# Patient Record
Sex: Male | Born: 1970 | Race: Black or African American | Marital: Married | Smoking: Current every day smoker
Health system: Southern US, Community
[De-identification: ages and names within clinical notes are randomized; demographics above are authoritative.]

## PROBLEM LIST (undated history)

## (undated) DIAGNOSIS — I10 Essential (primary) hypertension: Secondary | ICD-10-CM

## (undated) DIAGNOSIS — F32A Depression, unspecified: Secondary | ICD-10-CM

## (undated) DIAGNOSIS — I739 Peripheral vascular disease, unspecified: Secondary | ICD-10-CM

## (undated) HISTORY — DX: Peripheral vascular disease, unspecified: I73.9

## (undated) HISTORY — PX: OTHER SURGICAL HISTORY: SHX169

---

## 2014-05-06 DIAGNOSIS — M1990 Unspecified osteoarthritis, unspecified site: Secondary | ICD-10-CM

## 2014-05-06 DIAGNOSIS — G5600 Carpal tunnel syndrome, unspecified upper limb: Secondary | ICD-10-CM

## 2014-05-06 DIAGNOSIS — M19041 Primary osteoarthritis, right hand: Secondary | ICD-10-CM

## 2014-05-06 DIAGNOSIS — S63299A Dislocation of distal interphalangeal joint of unspecified finger, initial encounter: Secondary | ICD-10-CM | POA: Insufficient documentation

## 2014-05-06 HISTORY — DX: Carpal tunnel syndrome, unspecified upper limb: G56.00

## 2014-05-06 HISTORY — DX: Dislocation of distal interphalangeal joint of unspecified finger, initial encounter: S63.299A

## 2014-05-06 HISTORY — DX: Unspecified osteoarthritis, unspecified site: M19.90

## 2014-05-06 HISTORY — DX: Primary osteoarthritis, right hand: M19.041

## 2015-11-27 DIAGNOSIS — M21941 Unspecified acquired deformity of hand, right hand: Secondary | ICD-10-CM | POA: Insufficient documentation

## 2015-11-27 DIAGNOSIS — E559 Vitamin D deficiency, unspecified: Secondary | ICD-10-CM | POA: Insufficient documentation

## 2015-11-27 DIAGNOSIS — I10 Essential (primary) hypertension: Secondary | ICD-10-CM

## 2015-11-27 DIAGNOSIS — L409 Psoriasis, unspecified: Secondary | ICD-10-CM

## 2015-11-27 HISTORY — DX: Essential (primary) hypertension: I10

## 2015-11-27 HISTORY — DX: Unspecified acquired deformity of hand, right hand: M21.941

## 2015-11-27 HISTORY — DX: Psoriasis, unspecified: L40.9

## 2017-12-24 ENCOUNTER — Other Ambulatory Visit: Payer: Self-pay | Admitting: Orthopedic Surgery

## 2017-12-24 DIAGNOSIS — M25552 Pain in left hip: Secondary | ICD-10-CM

## 2018-01-06 ENCOUNTER — Ambulatory Visit
Admission: RE | Admit: 2018-01-06 | Discharge: 2018-01-06 | Disposition: A | Discharge status: Home / Self Care | Payer: BLUE CROSS/BLUE SHIELD | Source: Ambulatory Visit | Attending: Orthopedic Surgery | Admitting: Orthopedic Surgery

## 2018-01-06 DIAGNOSIS — M25552 Pain in left hip: Secondary | ICD-10-CM

## 2018-01-06 MED ORDER — IOPAMIDOL (ISOVUE-M 200) INJECTION 41%
20.0000 mL | Freq: Once | INTRAMUSCULAR | Status: AC
  Administered 2018-01-06: 20 mL via INTRA_ARTICULAR

## 2018-01-06 MED ORDER — METHYLPREDNISOLONE ACETATE 40 MG/ML INJ SUSP (RADIOLOG
120.0000 mg | Freq: Once | INTRAMUSCULAR | Status: AC
  Administered 2018-01-06: 120 mg via INTRA_ARTICULAR

## 2018-01-07 ENCOUNTER — Other Ambulatory Visit: Payer: Self-pay | Admitting: Orthopedic Surgery

## 2018-01-07 DIAGNOSIS — M25552 Pain in left hip: Secondary | ICD-10-CM

## 2018-01-19 ENCOUNTER — Ambulatory Visit
Admission: RE | Admit: 2018-01-19 | Discharge: 2018-01-19 | Disposition: A | Discharge status: Home / Self Care | Payer: BLUE CROSS/BLUE SHIELD | Source: Ambulatory Visit | Attending: Orthopedic Surgery | Admitting: Orthopedic Surgery

## 2018-01-19 DIAGNOSIS — M25552 Pain in left hip: Secondary | ICD-10-CM

## 2018-01-19 MED ORDER — IOPAMIDOL (ISOVUE-M 200) INJECTION 41%
15.0000 mL | Freq: Once | INTRAMUSCULAR | Status: AC
  Administered 2018-01-19: 15 mL via INTRA_ARTICULAR

## 2018-09-20 ENCOUNTER — Inpatient Hospital Stay (HOSPITAL_COMMUNITY)
Admission: EM | Admit: 2018-09-20 | Discharge: 2018-09-24 | DRG: 272 | Disposition: A | Discharge status: Home Health | Payer: BLUE CROSS/BLUE SHIELD | Attending: Vascular Surgery | Admitting: Vascular Surgery

## 2018-09-20 ENCOUNTER — Inpatient Hospital Stay (HOSPITAL_COMMUNITY): Payer: BLUE CROSS/BLUE SHIELD

## 2018-09-20 ENCOUNTER — Other Ambulatory Visit: Payer: Self-pay

## 2018-09-20 ENCOUNTER — Emergency Department (HOSPITAL_BASED_OUTPATIENT_CLINIC_OR_DEPARTMENT_OTHER): Payer: BLUE CROSS/BLUE SHIELD

## 2018-09-20 ENCOUNTER — Encounter (HOSPITAL_COMMUNITY): Payer: Self-pay

## 2018-09-20 DIAGNOSIS — I739 Peripheral vascular disease, unspecified: Secondary | ICD-10-CM | POA: Diagnosis present

## 2018-09-20 DIAGNOSIS — I10 Essential (primary) hypertension: Secondary | ICD-10-CM | POA: Diagnosis present

## 2018-09-20 DIAGNOSIS — M7989 Other specified soft tissue disorders: Secondary | ICD-10-CM | POA: Diagnosis not present

## 2018-09-20 DIAGNOSIS — F17210 Nicotine dependence, cigarettes, uncomplicated: Secondary | ICD-10-CM | POA: Diagnosis present

## 2018-09-20 DIAGNOSIS — I70212 Atherosclerosis of native arteries of extremities with intermittent claudication, left leg: Principal | ICD-10-CM | POA: Diagnosis present

## 2018-09-20 DIAGNOSIS — M79609 Pain in unspecified limb: Secondary | ICD-10-CM

## 2018-09-20 DIAGNOSIS — I998 Other disorder of circulatory system: Secondary | ICD-10-CM | POA: Diagnosis not present

## 2018-09-20 HISTORY — DX: Essential (primary) hypertension: I10

## 2018-09-20 LAB — BASIC METABOLIC PANEL
Anion gap: 10 (ref 5–15)
BUN: 7 mg/dL (ref 6–20)
CO2: 26 mmol/L (ref 22–32)
Calcium: 9.2 mg/dL (ref 8.9–10.3)
Chloride: 104 mmol/L (ref 98–111)
Creatinine, Ser: 1.07 mg/dL (ref 0.61–1.24)
GFR calc Af Amer: >60 mL/min (ref >60)
GFR calc non Af Amer: >60 mL/min (ref >60)
Glucose, Bld: 104 mg/dL — ABNORMAL HIGH (ref 70–99)
Potassium: 3.9 mmol/L (ref 3.5–5.1)
Sodium: 140 mmol/L (ref 135–145)

## 2018-09-20 LAB — CBC WITH DIFFERENTIAL/PLATELET
Abs Immature Granulocytes: 0.06 10*3/uL (ref 0.00–0.07)
Basophils Absolute: 0.1 10*3/uL (ref 0.0–0.1)
Basophils Relative: 0 %
Eosinophils Absolute: 0.1 10*3/uL (ref 0.0–0.5)
Eosinophils Relative: 1 %
HCT: 48.8 % (ref 39.0–52.0)
Hemoglobin: 16.1 g/dL (ref 13.0–17.0)
Immature Granulocytes: 1 %
Lymphocytes Relative: 24 %
Lymphs Abs: 3.1 10*3/uL (ref 0.7–4.0)
MCH: 26.4 pg (ref 26.0–34.0)
MCHC: 33 g/dL (ref 30.0–36.0)
MCV: 80.1 fL (ref 80.0–100.0)
Monocytes Absolute: 0.8 10*3/uL (ref 0.1–1.0)
Monocytes Relative: 6 %
Neutro Abs: 9.1 10*3/uL — ABNORMAL HIGH (ref 1.7–7.7)
Neutrophils Relative %: 68 %
Platelets: 262 10*3/uL (ref 150–400)
RBC: 6.09 MIL/uL — ABNORMAL HIGH (ref 4.22–5.81)
RDW: 15 % (ref 11.5–15.5)
WBC: 13.3 10*3/uL — ABNORMAL HIGH (ref 4.0–10.5)
nRBC: 0 % (ref 0.0–0.2)

## 2018-09-20 MED ORDER — HEPARIN BOLUS VIA INFUSION
4000.0000 [IU] | Freq: Once | INTRAVENOUS | Status: AC
  Administered 2018-09-20: 4000 [IU] via INTRAVENOUS
  Filled 2018-09-20: qty 4000

## 2018-09-20 MED ORDER — HYDRALAZINE HCL 20 MG/ML IJ SOLN: 5.0000 mg | INTRAMUSCULAR | Status: DC | PRN

## 2018-09-20 MED ORDER — PANTOPRAZOLE SODIUM 40 MG PO TBEC: 40.0000 mg | DELAYED_RELEASE_TABLET | Freq: Every day | ORAL | Status: DC

## 2018-09-20 MED ORDER — LABETALOL HCL 5 MG/ML IV SOLN
10.0000 mg | INTRAVENOUS | Status: DC | PRN
  Administered 2018-09-20 (×2): 10 mg via INTRAVENOUS
  Filled 2018-09-20 (×2): qty 4

## 2018-09-20 MED ORDER — METOPROLOL TARTRATE 5 MG/5ML IV SOLN: 2.0000 mg | INTRAVENOUS | Status: DC | PRN

## 2018-09-20 MED ORDER — KCL IN DEXTROSE-NACL 20-5-0.45 MEQ/L-%-% IV SOLN
INTRAVENOUS | Status: DC
  Administered 2018-09-20: 22:00:00 via INTRAVENOUS
  Filled 2018-09-20: qty 1000

## 2018-09-20 MED ORDER — GUAIFENESIN-DM 100-10 MG/5ML PO SYRP: 15.0000 mL | ORAL_SOLUTION | ORAL | Status: DC | PRN

## 2018-09-20 MED ORDER — HEPARIN (PORCINE) 25000 UT/250ML-% IV SOLN
1800.0000 [IU]/h | INTRAVENOUS | Status: DC
  Administered 2018-09-20: 1600 [IU]/h via INTRAVENOUS
  Filled 2018-09-20: qty 250

## 2018-09-20 MED ORDER — ACETAMINOPHEN 325 MG PO TABS: 325.0000 mg | ORAL_TABLET | ORAL | Status: DC | PRN

## 2018-09-20 MED ORDER — ALUM & MAG HYDROXIDE-SIMETH 200-200-20 MG/5ML PO SUSP: 15.0000 mL | ORAL | Status: DC | PRN

## 2018-09-20 MED ORDER — PHENOL 1.4 % MT LIQD
1.0000 | OROMUCOSAL | Status: DC | PRN
  Filled 2018-09-20: qty 177

## 2018-09-20 MED ORDER — IOPAMIDOL (ISOVUE-370) INJECTION 76%
100.0000 mL | Freq: Once | INTRAVENOUS | Status: AC | PRN
  Administered 2018-09-20: 100 mL via INTRAVENOUS

## 2018-09-20 MED ORDER — LISINOPRIL 2.5 MG PO TABS
2.5000 mg | ORAL_TABLET | Freq: Once | ORAL | Status: AC
  Administered 2018-09-20: 2.5 mg via ORAL
  Filled 2018-09-20: qty 1

## 2018-09-20 MED ORDER — ONDANSETRON HCL 4 MG/2ML IJ SOLN: 4.0000 mg | Freq: Four times a day (QID) | INTRAMUSCULAR | Status: DC | PRN

## 2018-09-20 MED ORDER — MORPHINE SULFATE (PF) 2 MG/ML IV SOLN
2.0000 mg | INTRAVENOUS | Status: DC | PRN
  Administered 2018-09-20 (×2): 4 mg via INTRAVENOUS
  Administered 2018-09-21: 5 mg via INTRAVENOUS
  Filled 2018-09-20: qty 2
  Filled 2018-09-20: qty 3
  Filled 2018-09-20 (×2): qty 2

## 2018-09-20 MED ORDER — ACETAMINOPHEN 325 MG RE SUPP: 325.0000 mg | RECTAL | Status: DC | PRN

## 2018-09-20 MED ORDER — IOPAMIDOL (ISOVUE-370) INJECTION 76%
INTRAVENOUS | Status: AC
  Filled 2018-09-20: qty 100

## 2018-09-20 MED ORDER — MORPHINE SULFATE (PF) 4 MG/ML IV SOLN
6.0000 mg | Freq: Once | INTRAVENOUS | Status: AC
  Administered 2018-09-20: 6 mg via INTRAVENOUS
  Filled 2018-09-20: qty 2

## 2018-09-20 MED ORDER — POTASSIUM CHLORIDE CRYS ER 20 MEQ PO TBCR: 20.0000 meq | EXTENDED_RELEASE_TABLET | Freq: Once | ORAL | Status: DC

## 2018-09-20 NOTE — ED Notes (Signed)
Patient transported to CT 

## 2018-09-20 NOTE — Progress Notes (Signed)
ANTICOAGULATION CONSULT NOTE - Initial Consult  Pharmacy Consult for heparin Indication: ischemic limb  Patient Measurements: Height: 6' 5.5" (196.9 cm) Weight: (!) 310 lb (140.6 kg) IBW/kg (Calculated) : 90.25 Heparin Dosing Weight: 121 kg  Vital Signs: Temp: 97.4 F (36.3 C) (01/28 1159) Temp Source: Oral (01/28 1159) BP: 180/112 (01/28 1906) Pulse Rate: 92 (01/28 1906)  Labs: Recent Labs    09/20/18 1903  HGB 16.1  HCT 48.8  PLT 262    Assessment:  48 yo male with L calf pain. Starting heparin infusion for ischemic limb. H/h, plts wnl, SCr 1. He is not on any anticoagulation prior to admission.   Goal of Therapy:  Heparin level 0.3-0.7 units/ml Monitor platelets by anticoagulation protocol: Yes    Plan:  -Heparin bolus 4000 units x1 then 1600 units/hr -Daily HL, CBC -First level with AM labs   Jamielyn Petrucci, Darl Householder 09/20/2018,7:39 PM

## 2018-09-20 NOTE — ED Provider Notes (Signed)
[facility] EMERGENCY DEPARTMENT Provider Note   CSN: [id number] Arrival date & time: 09/20/18  1129     History   Chief Complaint Chief Complaint  Patient presents with  . Leg Pain    HPI Normand Damron is a 48 y.o. male.  Complains of left calf pain onset gradually 3 days ago.  Pain is worse with weightbearing and improved with nonweightbearing no treatment prior to coming here.  No other associated symptoms..  He denies injury denies fever.  HPI  Past Medical History:  Diagnosis Date  . Hypertension     There are no active problems to display for this patient.   History reviewed. No pertinent surgical history.      Home Medications    Prior to Admission medications   Not on File    Family History History reviewed. No pertinent family history.  Social History Social History   Tobacco Use  . Smoking status: Current Every Day Smoker    Packs/day: 1.00    Types: Cigarettes  Substance Use Topics  . Alcohol use: Never    Frequency: Never  . Drug use: Never     Allergies   Patient has no known allergies.   Review of Systems Review of Systems  Constitutional: Negative.   HENT: Negative.   Respiratory: Negative.   Cardiovascular: Negative.   Gastrointestinal: Negative.   Musculoskeletal: Positive for myalgias.       Left calf pain.  Patient gives symptoms of claudication, prior to the past 3 days where he developed calf pain when walking which improved with rest  Skin: Negative.   Neurological: Negative.   Psychiatric/Behavioral: Negative.   All other systems reviewed and are negative.    Physical Exam Updated Vital Signs BP (!) 190/123 (BP Location: Right Arm)   Pulse (!) 102   Temp (!) 97.4 F (36.3 C) (Oral)   Resp 18   SpO2 98%   Physical Exam Vitals signs and nursing note reviewed.  Constitutional:      Appearance: Normal appearance. He is well-developed.  HENT:     Head: Normocephalic and atraumatic.  Eyes:       Conjunctiva/sclera: Conjunctivae normal.     Pupils: Pupils are equal, round, and reactive to light.  Neck:     Musculoskeletal: Neck supple.     Thyroid: No thyromegaly.     Trachea: No tracheal deviation.  Cardiovascular:     Rate and Rhythm: Normal rate and regular rhythm.     Heart sounds: No murmur.  Pulmonary:     Effort: Pulmonary effort is normal.     Breath sounds: Normal breath sounds.  Abdominal:     General: Bowel sounds are normal. There is no distension.     Palpations: Abdomen is soft.     Tenderness: There is no abdominal tenderness.  Musculoskeletal: Normal range of motion.        General: No tenderness.     Comments: Left lower extremity cool to the touch, hairless below the knee.  DP, PT and popliteal pulse absent.  Femoral pulse 2+.  Moves toes well  Right lower extremity DP, PT pulses absent.  Popliteal pulse present by Doppler.  Femoral pulse 2+.  Moves toes well  Bilateral upper extremities warm, radial pulses 2+ bilaterally    Skin:    General: Skin is warm and dry.     Findings: No rash.  Neurological:     Mental Status: He is alert.  Coordination: Coordination normal.      ED Treatments / Results  Labs (all labs ordered are listed, but only abnormal results are displayed) Labs Reviewed - No data to display  EKG None  Radiology Vas [place] Lower Extremity Venous (dvt) (only [facility])  Result Date: 09/20/2018  Lower Venous Study Indications: Swelling, and Pain.  Performing Technologist: Blanch MediaMegan Riddle RVS  Examination Guidelines: A complete evaluation includes B-mode imaging, spectral Doppler, color Doppler, and power Doppler as needed of all accessible portions of each vessel. Bilateral testing is considered an integral part of a complete examination. Limited examinations for reoccurring indications may be performed as noted.  Right Venous Findings: +---+---------------+---------+-----------+----------+-------+     CompressibilityPhasicitySpontaneityPropertiesSummary +---+---------------+---------+-----------+----------+-------+ CFVFull           Yes      Yes                          +---+---------------+---------+-----------+----------+-------+  Left Venous Findings: +---------+---------------+---------+-----------+----------+--------------+          CompressibilityPhasicitySpontaneityPropertiesSummary        +---------+---------------+---------+-----------+----------+--------------+ CFV      Full           Yes      Yes                                 +---------+---------------+---------+-----------+----------+--------------+ SFJ      Full                                                        +---------+---------------+---------+-----------+----------+--------------+ FV Prox  Full                                                        +---------+---------------+---------+-----------+----------+--------------+ FV Mid   Full                                                        +---------+---------------+---------+-----------+----------+--------------+ FV DistalFull                                                        +---------+---------------+---------+-----------+----------+--------------+ PFV      Full                                                        +---------+---------------+---------+-----------+----------+--------------+ POP      Full           Yes      Yes                                 +---------+---------------+---------+-----------+----------+--------------+  PTV      Full                                                        +---------+---------------+---------+-----------+----------+--------------+ PERO                                                  Not visualized +---------+---------------+---------+-----------+----------+--------------+    Summary: Right: No evidence of common femoral vein obstruction. Left: There is  no evidence of deep vein thrombosis in the lower extremity. No cystic structure found in the popliteal fossa.  *See table(s) above for measurements and observations. Electronically signed by Waverly Ferrari MD on 09/20/2018 at 5:09:22 PM.    Final     Procedures Procedures (including critical care time)  Medications Ordered in ED Medications - No data to display Results for orders placed or performed during the hospital encounter of 09/20/18  CBC with Differential/Platelet  Result Value Ref Range   WBC 13.3 (H) 4.0 - 10.5 K/uL   RBC 6.09 (H) 4.22 - 5.81 MIL/uL   Hemoglobin 16.1 13.0 - 17.0 g/dL   HCT 16.1 09.6 - 04.5 %   MCV 80.1 80.0 - 100.0 fL   MCH 26.4 26.0 - 34.0 pg   MCHC 33.0 30.0 - 36.0 g/dL   RDW 40.9 81.1 - 91.4 %   Platelets 262 150 - 400 K/uL   nRBC 0.0 0.0 - 0.2 %   Neutrophils Relative % 68 %   Neutro Abs 9.1 (H) 1.7 - 7.7 K/uL   Lymphocytes Relative 24 %   Lymphs Abs 3.1 0.7 - 4.0 K/uL   Monocytes Relative 6 %   Monocytes Absolute 0.8 0.1 - 1.0 K/uL   Eosinophils Relative 1 %   Eosinophils Absolute 0.1 0.0 - 0.5 K/uL   Basophils Relative 0 %   Basophils Absolute 0.1 0.0 - 0.1 K/uL   Immature Granulocytes 1 %   Abs Immature Granulocytes 0.06 0.00 - 0.07 K/uL   Vas [place] Lower Extremity Venous (dvt) (only [facility])  Result Date: 09/20/2018  Lower Venous Study Indications: Swelling, and Pain.  Performing Technologist: Blanch Media RVS  Examination Guidelines: A complete evaluation includes B-mode imaging, spectral Doppler, color Doppler, and power Doppler as needed of all accessible portions of each vessel. Bilateral testing is considered an integral part of a complete examination. Limited examinations for reoccurring indications may be performed as noted.  Right Venous Findings: +---+---------------+---------+-----------+----------+-------+    CompressibilityPhasicitySpontaneityPropertiesSummary +---+---------------+---------+-----------+----------+-------+  CFVFull           Yes      Yes                          +---+---------------+---------+-----------+----------+-------+  Left Venous Findings: +---------+---------------+---------+-----------+----------+--------------+          CompressibilityPhasicitySpontaneityPropertiesSummary        +---------+---------------+---------+-----------+----------+--------------+ CFV      Full           Yes      Yes                                 +---------+---------------+---------+-----------+----------+--------------+  SFJ      Full                                                        +---------+---------------+---------+-----------+----------+--------------+ FV Prox  Full                                                        +---------+---------------+---------+-----------+----------+--------------+ FV Mid   Full                                                        +---------+---------------+---------+-----------+----------+--------------+ FV DistalFull                                                        +---------+---------------+---------+-----------+----------+--------------+ PFV      Full                                                        +---------+---------------+---------+-----------+----------+--------------+ POP      Full           Yes      Yes                                 +---------+---------------+---------+-----------+----------+--------------+ PTV      Full                                                        +---------+---------------+---------+-----------+----------+--------------+ PERO                                                  Not visualized +---------+---------------+---------+-----------+----------+--------------+    Summary: Right: No evidence of common femoral vein obstruction. Left: There is no evidence of deep vein thrombosis in the lower extremity. No cystic structure found in the popliteal fossa.  *See  table(s) above for measurements and observations. Electronically signed by Waverly Ferrari MD on 09/20/2018 at 5:09:22 PM.    Final     Initial Impression / Assessment and Plan / ED Course  I have reviewed the triage vital signs and the nursing notes.  Pertinent labs & imaging results that were available during my care of the patient were reviewed by me and considered in my medical decision making (see chart for details).     Vascular surgeon Dr. Edilia Bo consulted who  will see patient in the ED.  Dr. Edilia Bo.  Patient in the emergency department and arrange for admission and will arrange for further vascular studies tomorrow 7:25 PM pain improved after treatment with intravenous morphine.  Lab work remarkable for leukocytosis.  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 CRITICAL CARE Performed by: Doug Sou Total critical care time: 30 minutes Critical care time was exclusive of separately billable procedures and treating other patients. Critical care was necessary to treat or prevent imminent or life-threatening deterioration. Critical care was time spent personally by me on the following activities: development of treatment plan with patient and/or surrogate as well as nursing, discussions with consultants, evaluation of patient's response to treatment, examination of patient, obtaining history from patient or surrogate, ordering and performing treatments and interventions, ordering and review of laboratory studies, ordering and review of radiographic studies, pulse oximetry and re-evaluation of patient's condition.  Final diagnoses:  None    ED Discharge Orders    None       Doug Sou, MD 09/20/18 [phone]

## 2018-09-20 NOTE — ED Triage Notes (Signed)
Pt endorses left lower leg pain from the knee down x 3 days. This RN had to doppler pulses behind ankle. Skin is warm and dry. Hypertensive, hx of same and has been off of BP meds x 5 months, recently got insurance again.

## 2018-09-20 NOTE — H&P (Signed)
REASON FOR CONSULT:    Left calf pain.  The consult was requested by Dr. Ethelda Chick.  HPI:   Sean [name] is a pleasant 48 y.o. male, who noted the gradual onset of left calf pain 3 days ago.  He tells me that the pain is limited to the calf and does not involve his foot.  Pain became more severe today and therefore he presented to the emergency department this morning.  Prior to this he did describe a history of left calf claudication which began gradually in October and has gradually progressed.  He denies any history of rest pain or nonhealing ulcers.  He denies any history of DVT.  He has had some hip issues and has injections in his hips occasionally.  His risk factors for peripheral vascular disease include hypertension, hypercholesterolemia, and tobacco use.  He denies any history of diabetes or family history of premature cardiovascular disease.  He smokes a pack per day of cigarettes and has been smoking since he was 48 years old.  He denies any cardiac history.  Specifically he denies any history of myocardial infarction, history of arrhythmias, or history of congestive heart failure.  Past Medical History:  Diagnosis Date  . Hypertension     History reviewed. No pertinent family history.  There is no family history of premature cardiovascular disease.  SOCIAL HISTORY: Social History   Socioeconomic History  . Marital status: Married    Spouse name: Not on file  . Number of children: Not on file  . Years of education: Not on file  . Highest education level: Not on file  Occupational History  . Not on file  Social Needs  . Financial resource strain: Not on file  . Food insecurity:    Worry: Not on file    Inability: Not on file  . Transportation needs:    Medical: Not on file    Non-medical: Not on file  Tobacco Use  . Smoking status: Current Every Day Smoker    Packs/day: 1.00    Types: Cigarettes  Substance and Sexual Activity  . Alcohol use: Never     Frequency: Never  . Drug use: Never  . Sexual activity: Not on file  Lifestyle  . Physical activity:    Days per week: Not on file    Minutes per session: Not on file  . Stress: Not on file  Relationships  . Social connections:    Talks on phone: Not on file    Gets together: Not on file    Attends religious service: Not on file    Active member of club or organization: Not on file    Attends meetings of clubs or organizations: Not on file    Relationship status: Not on file  . Intimate partner violence:    Fear of current or ex partner: Not on file    Emotionally abused: Not on file    Physically abused: Not on file    Forced sexual activity: Not on file  Other Topics Concern  . Not on file  Social History Narrative  . Not on file    No Known Allergies  Current Facility-Administered Medications  Medication Dose Route Frequency Provider Last Rate Last Dose  . acetaminophen (TYLENOL) tablet 325-650 mg  325-650 mg Oral Q4H PRN Chuck Hint, MD       Or  . acetaminophen (TYLENOL) suppository 325-650 mg  325-650 mg Rectal Q4H PRN Chuck Hint, MD      .  alum & mag hydroxide-simeth (MAALOX/MYLANTA) 200-200-20 MG/5ML suspension 15-30 mL  15-30 mL Oral Q2H PRN Chuck Hintickson, Aspen Lawrance S, MD      . dextrose 5 % and 0.45 % NaCl with KCl 20 mEq/L infusion   Intravenous Continuous Chuck Hintickson, Kieana Livesay S, MD      . guaiFENesin-dextromethorphan (ROBITUSSIN DM) 100-10 MG/5ML syrup 15 mL  15 mL Oral Q4H PRN Chuck Hintickson, Jahad Old S, MD      . hydrALAZINE (APRESOLINE) injection 5 mg  5 mg Intravenous Q20 Min PRN Chuck Hintickson, Janith Nielson S, MD      . labetalol (NORMODYNE,TRANDATE) injection 10 mg  10 mg Intravenous Q10 min PRN Chuck Hintickson, Selenia Mihok S, MD      . metoprolol tartrate (LOPRESSOR) injection 2-5 mg  2-5 mg Intravenous Q2H PRN Chuck Hintickson, Angie Hogg S, MD      . morphine 2 MG/ML injection 2-5 mg  2-5 mg Intravenous Q1H PRN Chuck Hintickson, Eissa Buchberger S, MD      . ondansetron  [facility](ZOFRAN) injection 4 mg  4 mg Intravenous Q6H PRN Chuck Hintickson, Gwin Eagon S, MD      . pantoprazole (PROTONIX) EC tablet 40 mg  40 mg Oral Daily Chuck Hintickson, Hadlea Furuya S, MD      . phenol [facility](CHLORASEPTIC) mouth spray 1 spray  1 spray Mouth/Throat PRN Chuck Hintickson, Annakate Soulier S, MD      . potassium chloride SA (K-DUR,KLOR-CON) CR tablet 20-40 mEq  20-40 mEq Oral Once Chuck Hintickson, Adolfo Granieri S, MD       No current outpatient medications on file.    REVIEW OF SYSTEMS:  [X]  denotes positive finding, [ ]  denotes negative finding Cardiac  Comments:  Chest pain or chest pressure:    Shortness of breath upon exertion:    Short of breath when lying flat:    Irregular heart rhythm:        Vascular    Pain in calf, thigh, or hip brought on by ambulation: x  left calf  Pain in feet at night that wakes you up from your sleep:     Blood clot in your veins:    Leg swelling:         Pulmonary    Oxygen at home:    Productive cough:     Wheezing:         Neurologic    Sudden weakness in arms or legs:     Sudden numbness in arms or legs:     Sudden onset of difficulty speaking or slurred speech:    Temporary loss of vision in one eye:     Problems with dizziness:         Gastrointestinal    Blood in stool:     Vomited blood:         Genitourinary    Burning when urinating:     Blood in urine:        Psychiatric    Major depression:         Hematologic    Bleeding problems:    Problems with blood clotting too easily:        Skin    Rashes or ulcers:        Constitutional    Fever or chills:     PHYSICAL EXAM:   Vitals:   09/20/18 1810 09/20/18 1815 09/20/18 1902 09/20/18 1906  BP: (!) 220/120 (!) 220/120  (!) 180/112  Pulse: 78 76  92  Resp:  18    Temp:      TempSrc:      SpO2:  98% 97%  97%  Weight:   (!) 140.6 kg   Height:   6' 5.5" (1.969 m)    GENERAL: The patient is a well-nourished male, in no acute distress. The vital signs are documented above. CARDIAC: There is a regular  rate and rhythm.  VASCULAR: I do not detect carotid bruits. On the left side, which is the symptomatic side, he has a palpable femoral pulse.  I cannot palpate a popliteal or pedal pulses.  He has a dampened monophasic posterior tibial signal only on the left.  On the right side he has a palpable femoral and popliteal pulse.  I cannot palpate pedal pulses.  He has a fairly brisk posterior tibial signal with the Doppler on the right. PULMONARY: There is good air exchange bilaterally without wheezing or rales. ABDOMEN: Soft and non-tender with normal pitched bowel sounds.  MUSCULOSKELETAL: There are no major deformities or cyanosis. NEUROLOGIC: No focal weakness.  He notes some mild paresthesias in the left foot. SKIN: There are no ulcers or rashes noted. PSYCHIATRIC: The patient has a normal affect.  DATA:    VENOUS DUPLEX: I have independently interpreted his venous duplex scan.  This shows no evidence of DVT in the left lower extremity.   ASSESSMENT & PLAN:   INFRAINGUINAL ARTERIAL OCCLUSIVE DISEASE: This patient describes a several month history of left calf claudication.  3 days ago, he noted the gradual onset of left calf pain which became more severe today.  The history is somewhat unusual in that he does not complain of  pain in his foot.  However, based on his exam I think he does have evidence of significant infrainguinal arterial occlusive disease.  Given his heavy smoking history I suspect he has underlying infrainguinal arterial occlusive disease which progressed and has become more symptomatic over the last few days.  Given the severity of his symptoms I have recommended that he be admitted and placed on heparin which I have ordered.  In addition I have ordered a CT angiogram of the aorta with runoff to further assess his vascular status.  He has no reason to have embolized.  Specifically he denies any history of recent myocardial infarction or history of arrhythmias.  I will make  further recommendations pending the results of his CT angiogram.  I have also discussed with him the importance of tobacco cessation.  Of note he tells me that he does take aspirin but ran out of his statin.   Waverly Ferrari [facility] [phone]

## 2018-09-20 NOTE — Progress Notes (Signed)
Left lower extremity venous duplex has been completed.   Results given to RN.   Preliminary results in CV Proc.   Blanch Media 09/20/2018 3:36 PM

## 2018-09-21 ENCOUNTER — Encounter (HOSPITAL_COMMUNITY)
Admission: EM | Disposition: A | Discharge status: Home Health | Payer: Self-pay | Source: Home / Self Care | Attending: Vascular Surgery

## 2018-09-21 ENCOUNTER — Inpatient Hospital Stay (HOSPITAL_COMMUNITY): Payer: BLUE CROSS/BLUE SHIELD

## 2018-09-21 ENCOUNTER — Encounter (HOSPITAL_COMMUNITY): Payer: Self-pay | Admitting: Family

## 2018-09-21 ENCOUNTER — Telehealth: Payer: Self-pay | Admitting: Vascular Surgery

## 2018-09-21 DIAGNOSIS — I998 Other disorder of circulatory system: Secondary | ICD-10-CM

## 2018-09-21 HISTORY — PX: EMBOLECTOMY: SHX44

## 2018-09-21 HISTORY — DX: Other disorder of circulatory system: I99.8

## 2018-09-21 LAB — HEPARIN LEVEL (UNFRACTIONATED)
Heparin Unfractionated: 0.28 IU/mL — ABNORMAL LOW (ref 0.30–0.70)
Heparin Unfractionated: 0.29 IU/mL — ABNORMAL LOW (ref 0.30–0.70)

## 2018-09-21 LAB — URINALYSIS, ROUTINE W REFLEX MICROSCOPIC
Bacteria, UA: NONE SEEN
Bilirubin Urine: NEGATIVE
Glucose, UA: NEGATIVE mg/dL
Ketones, ur: NEGATIVE mg/dL
Leukocytes, UA: NEGATIVE
Nitrite: NEGATIVE
Protein, ur: NEGATIVE mg/dL
Specific Gravity, Urine: >1.046 — ABNORMAL HIGH (ref 1.005–1.030)
pH: 6 (ref 5.0–8.0)

## 2018-09-21 LAB — ABO/RH: ABO/RH(D): O POS

## 2018-09-21 LAB — CBC
HCT: 45.1 % (ref 39.0–52.0)
Hemoglobin: 15.1 g/dL (ref 13.0–17.0)
MCH: 26.4 pg (ref 26.0–34.0)
MCHC: 33.5 g/dL (ref 30.0–36.0)
MCV: 78.8 fL — ABNORMAL LOW (ref 80.0–100.0)
Platelets: 250 10*3/uL (ref 150–400)
RBC: 5.72 MIL/uL (ref 4.22–5.81)
RDW: 14.8 % (ref 11.5–15.5)
WBC: 10.4 10*3/uL (ref 4.0–10.5)
nRBC: 0 % (ref 0.0–0.2)

## 2018-09-21 LAB — TYPE AND SCREEN
ABO/RH(D): O POS
Antibody Screen: NEGATIVE

## 2018-09-21 LAB — HIV ANTIBODY (ROUTINE TESTING W REFLEX): HIV Screen 4th Generation wRfx: NONREACTIVE

## 2018-09-21 SURGERY — EMBOLECTOMY
Anesthesia: General | Laterality: Left

## 2018-09-21 MED ORDER — CEFAZOLIN SODIUM 1 G IJ SOLR
INTRAMUSCULAR | Status: AC
  Filled 2018-09-21: qty 30

## 2018-09-21 MED ORDER — LABETALOL HCL 5 MG/ML IV SOLN
10.0000 mg | Freq: Once | INTRAVENOUS | Status: AC
  Administered 2018-09-21: 10 mg via INTRAVENOUS

## 2018-09-21 MED ORDER — MIDAZOLAM HCL 2 MG/2ML IJ SOLN
INTRAMUSCULAR | Status: AC
  Filled 2018-09-21: qty 2

## 2018-09-21 MED ORDER — MAGNESIUM SULFATE 2 GM/50ML IV SOLN: 2.0000 g | Freq: Every day | INTRAVENOUS | Status: DC | PRN

## 2018-09-21 MED ORDER — PROPOFOL 10 MG/ML IV BOLUS
INTRAVENOUS | Status: AC
  Filled 2018-09-21: qty 20

## 2018-09-21 MED ORDER — HYDRALAZINE HCL 20 MG/ML IJ SOLN
10.0000 mg | INTRAMUSCULAR | Status: DC | PRN
  Administered 2018-09-24: 10 mg via INTRAVENOUS
  Filled 2018-09-21: qty 1

## 2018-09-21 MED ORDER — SUCCINYLCHOLINE CHLORIDE 200 MG/10ML IV SOSY
PREFILLED_SYRINGE | INTRAVENOUS | Status: DC | PRN
  Administered 2018-09-21: 140 mg via INTRAVENOUS

## 2018-09-21 MED ORDER — LABETALOL HCL 5 MG/ML IV SOLN
INTRAVENOUS | Status: AC
  Filled 2018-09-21: qty 4

## 2018-09-21 MED ORDER — FENTANYL CITRATE (PF) 250 MCG/5ML IJ SOLN
INTRAMUSCULAR | Status: AC
  Filled 2018-09-21: qty 5

## 2018-09-21 MED ORDER — CEFAZOLIN SODIUM-DEXTROSE 2-4 GM/100ML-% IV SOLN
2.0000 g | Freq: Three times a day (TID) | INTRAVENOUS | Status: AC
  Administered 2018-09-21 – 2018-09-22 (×2): 2 g via INTRAVENOUS
  Filled 2018-09-21 (×2): qty 100

## 2018-09-21 MED ORDER — SODIUM CHLORIDE 0.9 % IV SOLN
INTRAVENOUS | Status: DC
  Administered 2018-09-21: 13:00:00 via INTRAVENOUS

## 2018-09-21 MED ORDER — DEXAMETHASONE SODIUM PHOSPHATE 10 MG/ML IJ SOLN
INTRAMUSCULAR | Status: AC
  Filled 2018-09-21: qty 1

## 2018-09-21 MED ORDER — DEXAMETHASONE SODIUM PHOSPHATE 10 MG/ML IJ SOLN
INTRAMUSCULAR | Status: DC | PRN
  Administered 2018-09-21: 10 mg via INTRAVENOUS

## 2018-09-21 MED ORDER — ONDANSETRON HCL 4 MG/2ML IJ SOLN
INTRAMUSCULAR | Status: AC
  Filled 2018-09-21: qty 2

## 2018-09-21 MED ORDER — PROTAMINE SULFATE 10 MG/ML IV SOLN
INTRAVENOUS | Status: DC | PRN
  Administered 2018-09-21: 10 mg via INTRAVENOUS

## 2018-09-21 MED ORDER — 0.9 % SODIUM CHLORIDE (POUR BTL) OPTIME
TOPICAL | Status: DC | PRN
  Administered 2018-09-21: 2000 mL

## 2018-09-21 MED ORDER — ROCURONIUM BROMIDE 50 MG/5ML IV SOSY
PREFILLED_SYRINGE | INTRAVENOUS | Status: DC | PRN
  Administered 2018-09-21 (×3): 10 mg via INTRAVENOUS
  Administered 2018-09-21 (×2): 20 mg via INTRAVENOUS
  Administered 2018-09-21: 10 mg via INTRAVENOUS
  Administered 2018-09-21: 50 mg via INTRAVENOUS

## 2018-09-21 MED ORDER — ONDANSETRON HCL 4 MG/2ML IJ SOLN
4.0000 mg | Freq: Four times a day (QID) | INTRAMUSCULAR | Status: DC | PRN
  Administered 2018-09-21: 4 mg via INTRAVENOUS
  Filled 2018-09-21: qty 2

## 2018-09-21 MED ORDER — MIDAZOLAM HCL 5 MG/5ML IJ SOLN
INTRAMUSCULAR | Status: DC | PRN
  Administered 2018-09-21: 2 mg via INTRAVENOUS

## 2018-09-21 MED ORDER — LABETALOL HCL 5 MG/ML IV SOLN
10.0000 mg | INTRAVENOUS | Status: DC | PRN
  Administered 2018-09-22 – 2018-09-24 (×2): 10 mg via INTRAVENOUS
  Filled 2018-09-21 (×2): qty 4

## 2018-09-21 MED ORDER — PHENOL 1.4 % MT LIQD: 1.0000 | OROMUCOSAL | Status: DC | PRN

## 2018-09-21 MED ORDER — DOCUSATE SODIUM 100 MG PO CAPS
100.0000 mg | ORAL_CAPSULE | Freq: Every day | ORAL | Status: DC
  Administered 2018-09-22 – 2018-09-24 (×3): 100 mg via ORAL
  Filled 2018-09-21 (×3): qty 1

## 2018-09-21 MED ORDER — POTASSIUM CHLORIDE CRYS ER 20 MEQ PO TBCR: 20.0000 meq | EXTENDED_RELEASE_TABLET | Freq: Every day | ORAL | Status: DC | PRN

## 2018-09-21 MED ORDER — DEXMEDETOMIDINE HCL IN NACL 200 MCG/50ML IV SOLN
INTRAVENOUS | Status: AC
  Filled 2018-09-21: qty 50

## 2018-09-21 MED ORDER — SODIUM CHLORIDE 0.9 % IV SOLN
INTRAVENOUS | Status: AC
  Filled 2018-09-21: qty 1.2

## 2018-09-21 MED ORDER — PROTAMINE SULFATE 10 MG/ML IV SOLN
INTRAVENOUS | Status: AC
  Filled 2018-09-21: qty 5

## 2018-09-21 MED ORDER — PAPAVERINE HCL 30 MG/ML IJ SOLN
INTRAMUSCULAR | Status: DC | PRN
  Administered 2018-09-21: 60 mg

## 2018-09-21 MED ORDER — HEPARIN BOLUS VIA INFUSION
2000.0000 [IU] | Freq: Once | INTRAVENOUS | Status: AC
  Administered 2018-09-21: 2000 [IU] via INTRAVENOUS
  Filled 2018-09-21: qty 2000

## 2018-09-21 MED ORDER — ACETAMINOPHEN 325 MG RE SUPP: 325.0000 mg | RECTAL | Status: DC | PRN

## 2018-09-21 MED ORDER — SUGAMMADEX SODIUM 500 MG/5ML IV SOLN
INTRAVENOUS | Status: AC
  Filled 2018-09-21: qty 5

## 2018-09-21 MED ORDER — ONDANSETRON HCL 4 MG/2ML IJ SOLN
INTRAMUSCULAR | Status: DC | PRN
  Administered 2018-09-21: 4 mg via INTRAVENOUS

## 2018-09-21 MED ORDER — PHENYLEPHRINE HCL 10 MG/ML IJ SOLN
INTRAMUSCULAR | Status: DC | PRN
  Administered 2018-09-21 (×2): 40 ug via INTRAVENOUS

## 2018-09-21 MED ORDER — HEPARIN SODIUM (PORCINE) 5000 UNIT/ML IJ SOLN
5000.0000 [IU] | Freq: Three times a day (TID) | INTRAMUSCULAR | Status: DC
  Administered 2018-09-22 – 2018-09-24 (×7): 5000 [IU] via SUBCUTANEOUS
  Filled 2018-09-21 (×7): qty 1

## 2018-09-21 MED ORDER — ALUM & MAG HYDROXIDE-SIMETH 200-200-20 MG/5ML PO SUSP: 15.0000 mL | ORAL | Status: DC | PRN

## 2018-09-21 MED ORDER — MORPHINE SULFATE (PF) 2 MG/ML IV SOLN
2.0000 mg | INTRAVENOUS | Status: DC | PRN
  Administered 2018-09-21 – 2018-09-23 (×4): 2 mg via INTRAVENOUS
  Filled 2018-09-21 (×3): qty 1

## 2018-09-21 MED ORDER — METOPROLOL TARTRATE 5 MG/5ML IV SOLN: 2.0000 mg | INTRAVENOUS | Status: DC | PRN

## 2018-09-21 MED ORDER — MORPHINE SULFATE (PF) 2 MG/ML IV SOLN
INTRAVENOUS | Status: AC
  Administered 2018-09-21: 2 mg via INTRAVENOUS
  Filled 2018-09-21: qty 1

## 2018-09-21 MED ORDER — PROMETHAZINE HCL 25 MG/ML IJ SOLN: 6.2500 mg | INTRAMUSCULAR | Status: DC | PRN

## 2018-09-21 MED ORDER — HYDRALAZINE HCL 20 MG/ML IJ SOLN
5.0000 mg | INTRAMUSCULAR | Status: AC | PRN
  Administered 2018-09-21 (×2): 5 mg via INTRAVENOUS
  Filled 2018-09-21 (×2): qty 1

## 2018-09-21 MED ORDER — PAPAVERINE HCL 30 MG/ML IJ SOLN
INTRAMUSCULAR | Status: AC
  Filled 2018-09-21: qty 2

## 2018-09-21 MED ORDER — SUCCINYLCHOLINE CHLORIDE 200 MG/10ML IV SOSY
PREFILLED_SYRINGE | INTRAVENOUS | Status: AC
  Filled 2018-09-21: qty 10

## 2018-09-21 MED ORDER — ASPIRIN 325 MG PO TABS
325.0000 mg | ORAL_TABLET | Freq: Every day | ORAL | Status: DC
  Administered 2018-09-22 – 2018-09-24 (×3): 325 mg via ORAL
  Filled 2018-09-21 (×3): qty 1

## 2018-09-21 MED ORDER — LABETALOL HCL 5 MG/ML IV SOLN
10.0000 mg | INTRAVENOUS | Status: DC | PRN
  Administered 2018-09-21: 10 mg via INTRAVENOUS
  Filled 2018-09-21: qty 4

## 2018-09-21 MED ORDER — GUAIFENESIN-DM 100-10 MG/5ML PO SYRP: 15.0000 mL | ORAL_SOLUTION | ORAL | Status: DC | PRN

## 2018-09-21 MED ORDER — CEFAZOLIN SODIUM-DEXTROSE 1-4 GM/50ML-% IV SOLN: 1.0000 g | INTRAVENOUS | Status: DC

## 2018-09-21 MED ORDER — POLYETHYLENE GLYCOL 3350 17 G PO PACK
17.0000 g | PACK | Freq: Every day | ORAL | Status: DC | PRN
  Administered 2018-09-23: 17 g via ORAL
  Filled 2018-09-21: qty 1

## 2018-09-21 MED ORDER — LIDOCAINE 2% (20 MG/ML) 5 ML SYRINGE
INTRAMUSCULAR | Status: DC | PRN
  Administered 2018-09-21: 100 mg via INTRAVENOUS

## 2018-09-21 MED ORDER — CALCIUM CHLORIDE 10 % IV SOLN
INTRAVENOUS | Status: AC
  Filled 2018-09-21: qty 20

## 2018-09-21 MED ORDER — ROCURONIUM BROMIDE 50 MG/5ML IV SOSY
PREFILLED_SYRINGE | INTRAVENOUS | Status: AC
  Filled 2018-09-21: qty 5

## 2018-09-21 MED ORDER — PANTOPRAZOLE SODIUM 40 MG PO TBEC
40.0000 mg | DELAYED_RELEASE_TABLET | Freq: Every day | ORAL | Status: DC
  Administered 2018-09-22 – 2018-09-24 (×3): 40 mg via ORAL
  Filled 2018-09-21 (×3): qty 1

## 2018-09-21 MED ORDER — HEPARIN SODIUM (PORCINE) 1000 UNIT/ML IJ SOLN
INTRAMUSCULAR | Status: AC
  Filled 2018-09-21: qty 2

## 2018-09-21 MED ORDER — SUGAMMADEX SODIUM 500 MG/5ML IV SOLN
INTRAVENOUS | Status: DC | PRN
  Administered 2018-09-21: 200 mg via INTRAVENOUS

## 2018-09-21 MED ORDER — SODIUM CHLORIDE 0.9 % IV SOLN: 500.0000 mL | Freq: Once | INTRAVENOUS | Status: DC | PRN

## 2018-09-21 MED ORDER — FENTANYL CITRATE (PF) 100 MCG/2ML IJ SOLN
INTRAMUSCULAR | Status: DC | PRN
  Administered 2018-09-21: 50 ug via INTRAVENOUS
  Administered 2018-09-21: 100 ug via INTRAVENOUS
  Administered 2018-09-21 (×5): 50 ug via INTRAVENOUS

## 2018-09-21 MED ORDER — OXYCODONE-ACETAMINOPHEN 5-325 MG PO TABS
1.0000 | ORAL_TABLET | ORAL | Status: DC | PRN
  Administered 2018-09-21: 2 via ORAL
  Administered 2018-09-21: 1 via ORAL
  Administered 2018-09-22 – 2018-09-24 (×9): 2 via ORAL
  Filled 2018-09-21 (×11): qty 2

## 2018-09-21 MED ORDER — ACETAMINOPHEN 325 MG PO TABS: 325.0000 mg | ORAL_TABLET | ORAL | Status: DC | PRN

## 2018-09-21 MED ORDER — BISACODYL 10 MG RE SUPP: 10.0000 mg | Freq: Every day | RECTAL | Status: DC | PRN

## 2018-09-21 MED ORDER — DEXMEDETOMIDINE HCL 200 MCG/2ML IV SOLN
INTRAVENOUS | Status: DC | PRN
  Administered 2018-09-21 (×2): 12 ug via INTRAVENOUS
  Administered 2018-09-21: 4 ug via INTRAVENOUS
  Administered 2018-09-21: 12 ug via INTRAVENOUS
  Administered 2018-09-21: 8 ug via INTRAVENOUS

## 2018-09-21 MED ORDER — SODIUM CHLORIDE 0.9 % IV SOLN
INTRAVENOUS | Status: DC | PRN
  Administered 2018-09-21: 500 mL

## 2018-09-21 MED ORDER — PROPOFOL 10 MG/ML IV BOLUS
INTRAVENOUS | Status: DC | PRN
  Administered 2018-09-21: 180 mg via INTRAVENOUS

## 2018-09-21 MED ORDER — HEPARIN SODIUM (PORCINE) 1000 UNIT/ML IJ SOLN
INTRAMUSCULAR | Status: DC | PRN
  Administered 2018-09-21: 5000 [IU] via INTRAVENOUS
  Administered 2018-09-21: 14000 [IU] via INTRAVENOUS

## 2018-09-21 MED ORDER — DEXTROSE 5 % IV SOLN
INTRAVENOUS | Status: DC | PRN
  Administered 2018-09-21: 3 g via INTRAVENOUS

## 2018-09-21 MED ORDER — LACTATED RINGERS IV SOLN
INTRAVENOUS | Status: DC | PRN
  Administered 2018-09-21: 07:00:00 via INTRAVENOUS

## 2018-09-21 MED ORDER — FENTANYL CITRATE (PF) 100 MCG/2ML IJ SOLN: 25.0000 ug | INTRAMUSCULAR | Status: DC | PRN

## 2018-09-21 SURGICAL SUPPLY — 65 items
BANDAGE ESMARK 6X9 LF (GAUZE/BANDAGES/DRESSINGS) IMPLANT
BNDG ESMARK 6X9 LF (GAUZE/BANDAGES/DRESSINGS)
CANISTER SUCT 3000ML PPV (MISCELLANEOUS) ×3 IMPLANT
CANNULA VESSEL 3MM 2 BLNT TIP (CANNULA) ×6 IMPLANT
CATH EMB 3FR 40CM (CATHETERS) ×4 IMPLANT
CATH EMB 3FR 80CM (CATHETERS) ×2 IMPLANT
CATH EMB 4FR 40CM (CATHETERS) ×2 IMPLANT
CATH EMB 4FR 80CM (CATHETERS) IMPLANT
CATH EMB 5FR 80CM (CATHETERS) IMPLANT
CLIP VESOCCLUDE MED 24/CT (CLIP) ×3 IMPLANT
CLIP VESOCCLUDE SM WIDE 24/CT (CLIP) ×5 IMPLANT
COVER PROBE W GEL 5X96 (DRAPES) ×2 IMPLANT
COVER WAND RF STERILE (DRAPES) ×3 IMPLANT
CUFF TOURNIQUET SINGLE 18IN (TOURNIQUET CUFF) IMPLANT
CUFF TOURNIQUET SINGLE 24IN (TOURNIQUET CUFF) IMPLANT
CUFF TOURNIQUET SINGLE 34IN LL (TOURNIQUET CUFF) IMPLANT
CUFF TOURNIQUET SINGLE 44IN (TOURNIQUET CUFF) IMPLANT
DERMABOND ADVANCED (GAUZE/BANDAGES/DRESSINGS) ×8
DERMABOND ADVANCED .7 DNX12 (GAUZE/BANDAGES/DRESSINGS) ×1 IMPLANT
DRAIN CHANNEL 15F RND FF W/TCR (WOUND CARE) IMPLANT
DRAPE X-RAY CASS 24X20 (DRAPES) IMPLANT
DRSG COVADERM 4X10 (GAUZE/BANDAGES/DRESSINGS) ×2 IMPLANT
DRSG TEGADERM 4X4.75 (GAUZE/BANDAGES/DRESSINGS) ×2 IMPLANT
DRSG VAC ATS MED SENSATRAC (GAUZE/BANDAGES/DRESSINGS) ×2 IMPLANT
DRSG VAC ATS SM SENSATRAC (GAUZE/BANDAGES/DRESSINGS) ×2 IMPLANT
ELECT REM PT RETURN 9FT ADLT (ELECTROSURGICAL) ×3
ELECTRODE REM PT RTRN 9FT ADLT (ELECTROSURGICAL) ×1 IMPLANT
EVACUATOR SILICONE 100CC (DRAIN) IMPLANT
GAUZE 4X4 16PLY RFD (DISPOSABLE) ×2 IMPLANT
GLOVE BIO SURGEON STRL SZ7.5 (GLOVE) ×3 IMPLANT
GLOVE BIOGEL PI IND STRL 8 (GLOVE) ×1 IMPLANT
GLOVE BIOGEL PI INDICATOR 8 (GLOVE) ×2
GLOVE ECLIPSE 6.5 STRL STRAW (GLOVE) ×2 IMPLANT
GOWN STRL REUS W/ TWL LRG LVL3 (GOWN DISPOSABLE) ×3 IMPLANT
GOWN STRL REUS W/TWL LRG LVL3 (GOWN DISPOSABLE) ×10
KIT BASIN OR (CUSTOM PROCEDURE TRAY) ×3 IMPLANT
KIT TURNOVER KIT B (KITS) ×3 IMPLANT
NS IRRIG 1000ML POUR BTL (IV SOLUTION) ×6 IMPLANT
PACK PERIPHERAL VASCULAR (CUSTOM PROCEDURE TRAY) ×3 IMPLANT
PAD ARMBOARD 7.5X6 YLW CONV (MISCELLANEOUS) ×6 IMPLANT
SET COLLECT BLD 21X3/4 12 (NEEDLE) IMPLANT
SPONGE LAP 18X18 RF (DISPOSABLE) ×2 IMPLANT
SPONGE SURGIFOAM ABS GEL 100 (HEMOSTASIS) IMPLANT
STAPLER VISISTAT (STAPLE) IMPLANT
STAPLER VISISTAT 35W (STAPLE) ×2 IMPLANT
STOPCOCK 4 WAY LG BORE MALE ST (IV SETS) IMPLANT
SUT MNCRL AB 4-0 PS2 18 (SUTURE) ×6 IMPLANT
SUT PROLENE 5 0 C 1 24 (SUTURE) ×5 IMPLANT
SUT PROLENE 6 0 BV (SUTURE) ×12 IMPLANT
SUT SILK 2 0 SH (SUTURE) ×2 IMPLANT
SUT SILK 3 0 (SUTURE) ×2
SUT SILK 3-0 18XBRD TIE 12 (SUTURE) IMPLANT
SUT VIC AB 2-0 CTB1 (SUTURE) ×3 IMPLANT
SUT VIC AB 3-0 SH 27 (SUTURE) ×8
SUT VIC AB 3-0 SH 27X BRD (SUTURE) ×1 IMPLANT
SUT VICRYL 4-0 PS2 18IN ABS (SUTURE) ×3 IMPLANT
SYR 3ML LL SCALE MARK (SYRINGE) ×5 IMPLANT
SYR TB 1ML LUER SLIP (SYRINGE) ×2 IMPLANT
SYRINGE 20CC LL (MISCELLANEOUS) ×2 IMPLANT
TAPE UMBILICAL COTTON 1/8X30 (MISCELLANEOUS) ×2 IMPLANT
TOWEL GREEN STERILE (TOWEL DISPOSABLE) ×3 IMPLANT
TRAY FOLEY MTR SLVR 16FR STAT (SET/KITS/TRAYS/PACK) ×3 IMPLANT
TUBING EXTENTION W/L.L. (IV SETS) IMPLANT
UNDERPAD 30X30 (UNDERPADS AND DIAPERS) ×3 IMPLANT
WATER STERILE IRR 1000ML POUR (IV SOLUTION) ×3 IMPLANT

## 2018-09-21 NOTE — Progress Notes (Signed)
Pt received from PACU. Wife at bedside. Pt and wife oriented to room and equipment. VSS. Telemetry re-applied. All incisions assessed. Wound vac to continuous pressure with good seal. Foley below level of bladder. Pt drowsy but arousable, answers questions appropriately.   Leonidas Romberg, RN

## 2018-09-21 NOTE — Progress Notes (Signed)
Spoke with Dr. Chestine Spore of [facility]. Pt's BP has been elevated this afternoon requiring IV hydralazine and labetolol. Given verbal order for re-order of IV labetolol and modification of hydralazine to 10mg  for SBP >160.   Leonidas Romberg, RN

## 2018-09-21 NOTE — Progress Notes (Signed)
ANTICOAGULATION CONSULT NOTE - Follow Up Consult  Pharmacy Consult for heparin Indication: ischemic LLE  Labs: Recent Labs    09/20/18 1903 09/21/18 0358  HGB 16.1 15.1  HCT 48.8 45.1  PLT 262 250  HEPARINUNFRC  --  0.28*  CREATININE 1.07  --     Assessment: 47yo male subtherapeutic on heparin with initial dosing for ischemic leg.  Goal of Therapy:  Heparin level 0.3-0.7 units/ml   Plan:  Will give small bolus of heparin 2000 units and increase heparin gtt by 1-2 units/kg/hr to 1800 units/hr and check level in 6 hours.    Vernard Gambles, PharmD, BCPS  09/21/2018,5:17 AM

## 2018-09-21 NOTE — Transfer of Care (Signed)
Immediate Anesthesia Transfer of Care Note  Patient: Sean [name]  Procedure(s) Performed: THROMBECTOMY LEFT POPLITEAL ARTERY BYPASS LEFT POPLITEAL TO TIBIAL ARTERY WITH SAPHENOUS VEIN HARVEST - FASCIOTOMY (Left )  Patient Location: PACU  Anesthesia Type:General  Level of Consciousness: drowsy  Airway & Oxygen Therapy: Patient Spontanous Breathing and Patient connected to face mask oxygen  Post-op Assessment: Report given to RN and Post -op Vital signs reviewed and stable  Post vital signs: Reviewed and stable  Last Vitals:  Vitals Value Taken Time  BP 112/77 09/21/2018 11:20 AM  Temp    Pulse 85 09/21/2018 11:21 AM  Resp 18 09/21/2018 11:21 AM  SpO2 95 % 09/21/2018 11:21 AM  Vitals shown include unvalidated device data.  Last Pain:  Vitals:   09/21/18 0400  TempSrc: Oral  PainSc: 7          Complications: No apparent anesthesia complications

## 2018-09-21 NOTE — Progress Notes (Signed)
PT Cancellation Note  Patient Details Name: Sean PalmaClarence [name] MRN: [id number] DOB: 03/18/1971   Cancelled Treatment:    Reason Eval/Treat Not Completed: Patient not medically ready.  Pt recovering from bout of nausea/vomiting.  Zofran given, will see tomorrow as able. 09/21/2018  Cedar BingKen Sean [name], PT [facility] [phone]  (pager) [phone]  (office)   [name] 09/21/2018, 5:32 PM

## 2018-09-21 NOTE — Anesthesia Preprocedure Evaluation (Signed)
Anesthesia Evaluation  Patient identified by MRN, date of birth, ID band Patient awake    Reviewed: Allergy & Precautions, NPO status , Patient's Chart, lab work & pertinent test results  Airway Mallampati: II  TM Distance: >3 FB Neck ROM: Full    Dental  (+) Teeth Intact, Dental Advisory Given   Pulmonary Current Smoker,    Pulmonary exam normal breath sounds clear to auscultation       Cardiovascular hypertension, + Peripheral Vascular Disease  Normal cardiovascular exam Rhythm:Regular Rate:Normal     Neuro/Psych negative neurological ROS     GI/Hepatic negative GI ROS, Neg liver ROS,   Endo/Other  negative endocrine ROSObesity   Renal/GU negative Renal ROS     Musculoskeletal negative musculoskeletal ROS (+)   Abdominal   Peds  Hematology negative hematology ROS (+)   Anesthesia Other Findings Day of surgery medications reviewed with the patient.  Reproductive/Obstetrics                             Anesthesia Physical Anesthesia Plan  ASA: III and emergent  Anesthesia Plan: General   Post-op Pain Management:    Induction: Intravenous  PONV Risk Score and Plan: 1 and Ondansetron, Dexamethasone and Midazolam  Airway Management Planned: Oral ETT  Additional Equipment:   Intra-op Plan:   Post-operative Plan: Extubation in OR  Informed Consent: I have reviewed the patients History and Physical, chart, labs and discussed the procedure including the risks, benefits and alternatives for the proposed anesthesia with the patient or authorized representative who has indicated his/her understanding and acceptance.     Dental advisory given  Plan Discussed with: CRNA  Anesthesia Plan Comments:         Anesthesia Quick Evaluation

## 2018-09-21 NOTE — Progress Notes (Signed)
Patient with episode of nausea and vomiting, Patient given Zofran as ordered as needed for nausea/ vomiting. Will monitor patient. Seva Chancy, Randall An RN

## 2018-09-21 NOTE — Telephone Encounter (Signed)
-----   Message from Dara LordsSamantha J Rhyne, [place]-C sent at 09/21/2018 11:09 AM EST ----- S/p pop to PT bypass left 09/21/2018.  F/u with Dr. Randie Heinzain in 2-3 weeks.  Thanks

## 2018-09-21 NOTE — Discharge Instructions (Signed)
 [facility]  Discharge instructions  Lower Extremity Bypass Surgery  Please refer to the following instruction for your post-procedure care. Your surgeon or physician assistant will discuss any changes with you.  Activity  You are encouraged to walk as much as you can. You can slowly return to normal activities during the month after your surgery. Avoid strenuous activity and heavy lifting until your doctor tells you it's OK. Avoid activities such as vacuuming or swinging a golf club. Do not drive until your doctor give the OK and you are no longer taking prescription pain medications. It is also normal to have difficulty with sleep habits, eating and bowel movement after surgery. These will go away with time.  Bathing/Showering  Shower daily after you go home. Do not soak in a bathtub, hot tub, or swim until the incision heals completely.  Incision Care  Clean your incision with mild soap and water. Shower every day. Pat the area dry with a clean towel. You do not need a bandage unless otherwise instructed. Do not apply any ointments or creams to your incision. If you have open wounds you will be instructed how to care for them or a visiting nurse may be arranged for you. If you have staples or sutures along your incision they will be removed at your post-op appointment. You may have skin glue on your incision. Do not peel it off. It will come off on its own in about one week.  Wash the groin wound with soap and water daily and pat dry. (No tub bath-only shower)  Then put a dry gauze or washcloth in the groin to keep this area dry to help prevent wound infection.  Do this daily and as needed.  Do not use Vaseline or neosporin on your incisions.  Only use soap and water on your incisions and then protect and keep dry.  Diet  Resume your normal diet. There are no special food restrictions following this procedure. A low fat/ low cholesterol diet is  recommended for all patients with vascular disease. In order to heal from your surgery, it is CRITICAL to get adequate nutrition. Your body requires vitamins, minerals, and protein. Vegetables are the best source of vitamins and minerals. Vegetables also provide the perfect balance of protein. Processed food has little nutritional value, so try to avoid this.  Medications  Resume taking all your medications unless your doctor or physician assistant tells you not to. If your incision is causing pain, you may take over-the-counter pain relievers such as acetaminophen (Tylenol). If you were prescribed a stronger pain medication, please aware these medication can cause nausea and constipation. Prevent nausea by taking the medication with a snack or meal. Avoid constipation by drinking plenty of fluids and eating foods with high amount of fiber, such as fruits, vegetables, and grains. Take Colace 100 mg (an over-the-counter stool softener) twice a day as needed for constipation.  Do not take Tylenol if you are taking prescription pain medications.  Follow Up  Our office will schedule a follow up appointment 2-3 weeks following discharge.  Please call us immediately for any of the following conditions  Severe or worsening pain in your legs or feet while at rest or while walking Increase pain, redness, warmth, or drainage (pus) from your incision site(s) Fever of 101 degree or higher The swelling in your leg with the bypass suddenly worsens and becomes more painful than when you were in the hospital If you have   been instructed to feel your graft pulse then you should do so every day. If you can no longer feel this pulse, call the office immediately. Not all patients are given this instruction.  Leg swelling is common after leg bypass surgery.  The swelling should improve over a few months following surgery. To improve the swelling, you may elevate your legs above the level of your heart while you are  sitting or resting. Your surgeon or physician assistant may ask you to apply an ACE wrap or wear compression (TED) stockings to help to reduce swelling.  Reduce your risk of vascular disease  Stop smoking. If you would like help call [facility] at 1-800-QUIT-NOW (1-800-784-8669) or [place] at 336-586-4000.  Manage your cholesterol Maintain a desired weight Control your diabetes weight Control your diabetes Keep your blood pressure down  If you have any questions, please call the office at [phone]  

## 2018-09-21 NOTE — Progress Notes (Signed)
VASCULAR SURGERY:  The patient CT scan shows occlusion of the popliteal artery.  There appears to be some reconstitution of a very small posterior tibial artery although it is difficult to see.  He has minimal flow in his posterior tibial artery and continues to have significant pain.  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  I think the best chance for limb salvage is attempted exploration of the popliteal artery and possible bypass.  I have discussed the procedure and potential complications with the patient and he is agreeable to proceed.  He understands that without revascularization he is at very high risk for limb loss.  We will proceed urgently.  Waverly Ferrari, MD, FACS Beeper [phone] Office: [phone]

## 2018-09-21 NOTE — Op Note (Signed)
Patient name: Ignacia PalmaClarence Bergren MRN: [id number] DOB: 09/14/1970 Sex: male  09/21/2018 Pre-operative Diagnosis: Acute left lower extremity ischemia Post-operative diagnosis:  Same Surgeon:  Apolinar JunesBrandon C. Randie Heinzain, MD  Assistant: Cari Carawayhris Dickson, MD; Doreatha MassedSamantha Rhyne, [place] Procedure Performed: 1.  Harvest of left greater saphenous vein 2.  Left above-knee popliteal to posterior tibial artery bypass with non-reversed ipsilateral greater saphenous vein 3.  Left lower extremity 4 compartment fasciotomies 4.  Application of negative pressure dressing  Indications: [age] year old male presented with what appeared to be acute on chronic left lower extremity ischemia with a CT scan demonstrating an occluded popliteal artery at the level of the knee and runoff was difficult to evaluate but was thought to be via the posterior tibial artery.  He was therefore indicated for open exploration with possible thromboembolectomy and possible bypass.  Findings: The greater saphenous vein measured approximately 3 mm below the knee above the knee was up to 3-1/2 mm.  This did dilate nicely.  The above-knee popliteal artery was free of disease there was a very strong pulse.  Below the knee the popliteal artery was occluded there did not appear to be any flow in the anterior tibial peroneal arteries.  After opening the artery I could not pass a Fogarty proximally up the popliteal or down the peroneal arteries.  I was able to get down to a level of the posterior tibial artery that was free of disease and a Fogarty passed easily and no clot returned.  After bypass there was a palpable posterior tibial pulse at the ankle with good signals in the graft itself.  There was significant bulging of the gastrocnemius and soleus muscle so 4 compartment fasciotomies were performed but laterally and anteriorly there was no bulging and these incision was closed.  I was able to somewhat close the medial incision placed the wound VAC on the open  aspect.   Procedure:  The patient was identified in the holding area and taken to the operating room where general anesthesia was induced he was sterilely prepped and draped the left lower extremity usual fashion.  Antibiotics were administered and a timeout was called.  Dr. Edilia Boickson began this case with the below-knee incision first evaluating the greater saphenous vein taking branches between clips and ties.  I then divided through the deep fascia expose the popliteal artery which was noted to be heavily calcified there was no flow within it.  I divided the anterior tibial vein expose the anterior tibial artery which was also heavily calcified.  I did traced down dividing veins until I expose the entire tibioperoneal trunk and bifurcation.  I also evaluated the posterior tibial artery and this did seem softer.  I then made an above-the-knee incision dissected down and expose the popliteal artery where there was a strong popliteal pulse and I placed a vessel loop around this.  I made 3 further skip incisions to trace the greater saphenous vein up to near the groin after marking it with ultrasound.  Branches were taken between clips and ties.  I then turned my attention to the artery distally.  The patient was fully heparinized after I tunneled from the above-knee to below-knee popliteal incisions.  I elected make a longitudinal incision from the tibioperoneal trunk back up to the popliteal artery.  This was all heavily calcified does not have any antegrade flow and there was some acute appearing clot.  I attempted to pass a Fogarty catheter proximally and I did get some return of acute  clot but there was such heavy calcification I could not pass the Fogarty fully.  I really had no antegrade bleeding.  I attempted to pass a 3 Fogarty down the peroneal artery but this was too completely occluded.  Posterior tibial artery also appeared occluded at this level.  I then extended my arteriotomy down the posterior tibial  artery given that this appeared to be the artery that was patent on preoperative CT scan.  Ultimately I was able to get to the level that was completely free of disease.  I passed a 3 Fogarty easily distally return no clot.  Satisfied with this I flushed with heparinized saline and clamped it.  I then prepared my vein on the back table.  Given the size mismatch I thought it would not be able to be reversed.  I clamped the above-knee popliteal artery distally proximally opened longitudinally.  I spatulated my vein and sewed it end-to-side with 6-0 Prolene suture.  After completion anastomosis I released the clamps I did have good flow through the vein up until the first valve.  I then performed valve lysis with a valvulotome and had strong antegrade bleeding.  I marked the vein for orientation and tunneled it.  Patient at this time was re-heparinized.  Again checked by backbleeding from the posterior tibial artery.  I oversewed the tibioperoneal trunk and peroneal artery with 5-0 Prolene suture given that there was some drip antegrade bleeding.  I transected the posterior tibial artery at this time and cleaned it up and spatulated.  I spatulated my distal vein confirmed orientation and sewed them end-to-end with 6-0 Prolene suture.  Prior to completion anastomosis I allowed antegrade backbleeding.  I then irrigated with heparinized saline.  Upon completion there was very good flow in the vein bypass and distally in the posterior tibial.  He had a palpable posterior tibial pulse and very good signal in the vein bypass.  50 mg of protamine was administered.  I did have significant bulging of my gastrocnemius and soleus muscles.  I then took down the soleal bridge and extended my incision fasciotomy medially all the way down to the ankle.  I obtained hemostasis here.  I then made a longitudinal incision laterally open the anterior and lateral fascia as well but there was no muscle bulging at all the muscle appeared  healthy.  This incision was closed with staples.  Medially I was able to close somewhat staples in place the wound VAC in the middle aspect of the incision.  Above the incisions were all inspected and irrigated.  When hemostasis was obtained they were closed with running 2-0 Vicryl followed by 4 Monocryl.  Dermabond placed at the level of the skin.  Patient was then awakened anesthesia having tolerated procedure without immediate complication.  All counts were correct at completion.  EBL: 200 cc    Abbagail Scaff C. Randie Heinz, MD [facility] Office: [phone] Pager: [phone]

## 2018-09-21 NOTE — Telephone Encounter (Signed)
sch appt lvm mld ltr 10/14/2018 115pm p/o MD

## 2018-09-21 NOTE — Anesthesia Procedure Notes (Signed)
Procedure Name: Intubation Date/Time: 09/21/2018 7:15 AM Performed by: Pearson Grippeobertson, Oline Belk M, CRNA Pre-anesthesia Checklist: Patient identified, Emergency Drugs available, Suction available, Patient being monitored and Timeout performed Patient Re-evaluated:Patient Re-evaluated prior to induction Oxygen Delivery Method: Circle system utilized Preoxygenation: Pre-oxygenation with 100% oxygen Induction Type: IV induction Laryngoscope Size: Miller and 2 Grade View: Grade I Tube type: Oral Tube size: 8.0 mm Number of attempts: 1 Airway Equipment and Method: Patient positioned with wedge pillow Placement Confirmation: ETT inserted through vocal cords under direct vision,  positive ETCO2 and breath sounds checked- equal and bilateral Secured at: 22 cm Tube secured with: Tape Dental Injury: Teeth and Oropharynx as per pre-operative assessment

## 2018-09-22 ENCOUNTER — Encounter (HOSPITAL_COMMUNITY): Payer: Self-pay | Admitting: Vascular Surgery

## 2018-09-22 LAB — BASIC METABOLIC PANEL
Anion gap: 8 (ref 5–15)
BUN: 10 mg/dL (ref 6–20)
CO2: 22 mmol/L (ref 22–32)
Calcium: 8.2 mg/dL — ABNORMAL LOW (ref 8.9–10.3)
Chloride: 109 mmol/L (ref 98–111)
Creatinine, Ser: 1.07 mg/dL (ref 0.61–1.24)
GFR calc Af Amer: >60 mL/min (ref >60)
GFR calc non Af Amer: >60 mL/min (ref >60)
Glucose, Bld: 136 mg/dL — ABNORMAL HIGH (ref 70–99)
Potassium: 3.8 mmol/L (ref 3.5–5.1)
Sodium: 139 mmol/L (ref 135–145)

## 2018-09-22 LAB — CBC
HCT: 39.9 % (ref 39.0–52.0)
Hemoglobin: 12.8 g/dL — ABNORMAL LOW (ref 13.0–17.0)
MCH: 25.7 pg — ABNORMAL LOW (ref 26.0–34.0)
MCHC: 32.1 g/dL (ref 30.0–36.0)
MCV: 80 fL (ref 80.0–100.0)
Platelets: 232 10*3/uL (ref 150–400)
RBC: 4.99 MIL/uL (ref 4.22–5.81)
RDW: 15.1 % (ref 11.5–15.5)
WBC: 12.5 10*3/uL — ABNORMAL HIGH (ref 4.0–10.5)
nRBC: 0 % (ref 0.0–0.2)

## 2018-09-22 NOTE — Anesthesia Postprocedure Evaluation (Signed)
Anesthesia Post Note  Patient: [name]  Procedure(s) Performed: THROMBECTOMY LEFT POPLITEAL ARTERY BYPASS LEFT POPLITEAL TO TIBIAL ARTERY WITH SAPHENOUS VEIN HARVEST - FASCIOTOMY (Left )     Patient location during evaluation: PACU Anesthesia Type: General Level of consciousness: awake and alert Pain management: pain level controlled Vital Signs Assessment: post-procedure vital signs reviewed and stable Respiratory status: spontaneous breathing, nonlabored ventilation, respiratory function stable and patient connected to nasal cannula oxygen Cardiovascular status: blood pressure returned to baseline and stable Postop Assessment: no apparent nausea or vomiting Anesthetic complications: no    Last Vitals:  Vitals:   09/21/18 2352 09/22/18 0407  BP: (!) 141/93 (!) 151/96  Pulse: 95   Resp: 16 20  Temp: 36.9 C 37 C  SpO2: 97% 96%    Last Pain:  Vitals:   09/22/18 0519  TempSrc:   PainSc: 7    Pain Goal: Patients Stated Pain Goal: 0 (09/22/18 0519)                 Cecile Hearing

## 2018-09-22 NOTE — Evaluation (Signed)
Occupational Therapy Evaluation Patient Details Name: Gilmer Rabuck MRN: [id number] DOB: [birth year] Today's Date: 09/22/2018    History of Present Illness Pt is a 48 yo male s/p LLE ischemia resulting in requiring AK popliteal tibial artery bypass graft with nonreveresed ipsilateral saphenous veinLLE fasciotomies in 4 compartments with drain.   Clinical Impression   Pt is a 48 yo male s/p above dx. PTA: pt living with family. Independent with ADL and mobility, no AD. Pt currently, limited by poor activity tolerance, severe pain and decreased mobility. Pt modA for sit to stand with bed elevated; tolerating only stand pivot to recliner. Pt increased assist for lower body ADL due to weakness and pain. Pt unable to take care for self at home and requires assist for bed mobilty, transfers and mobility with RW. Pt would benefit from continued OT skilled services for ADL, mobility and safety in CIR setting. May look into SNF or HH if pt continues to improve drastically.    Follow Up Recommendations  CIR;SNF;Supervision - Intermittent    Equipment Recommendations  3 in 1 bedside commode;Other (comment)(RW)    Recommendations for Other Services       Precautions / Restrictions Precautions Precautions: Fall Precaution Comments: (watch for wound vac) Restrictions Weight Bearing Restrictions: No      Mobility Bed Mobility Overal bed mobility: Needs Assistance             General bed mobility comments: increased time  Transfers Overall transfer level: Needs assistance Equipment used: Rolling walker (2 wheeled) Transfers: Sit to/from [employer] Transfers Sit to Stand: Mod assist Stand pivot transfers: Mod assist;From elevated surface       General transfer comment: Increased time and verbal cues for hand placement    Balance Overall balance assessment: Needs assistance   Sitting balance-Leahy Scale: Fair       Standing balance-Leahy Scale: Poor                              ADL either performed or assessed with clinical judgement   ADL Overall ADL's : Needs assistance/impaired Eating/Feeding: Set up   Grooming: Wash/dry hands;Wash/dry face;Oral care;Sitting   Upper Body Bathing: Set up;Sitting   Lower Body Bathing: Sitting/lateral leans;Sit to/from stand;With adaptive equipment;Moderate assistance   Upper Body Dressing : Set up;Sitting   Lower Body Dressing: Moderate assistance;With adaptive equipment;Sit to/from stand;Sitting/lateral leans   Toilet Transfer: Moderate assistance;BSC   Toileting- Clothing Manipulation and Hygiene: Moderate assistance;Sit to/from stand       Functional mobility during ADLs: Moderate assistance;Rolling walker;Cueing for safety;Cueing for sequencing General ADL Comments: Mod A for LB ADL with increased time. Pt reports spouse can assist w/ ADLs at home. Pt unable to stand to perform ADLs due to pain     Vision Baseline Vision/History: No visual deficits Vision Assessment?: No apparent visual deficits     Perception     Praxis      Pertinent Vitals/Pain       Hand Dominance Right   Extremity/Trunk Assessment Upper Extremity Assessment Upper Extremity Assessment: Generalized weakness   Lower Extremity Assessment Lower Extremity Assessment: Generalized weakness;LLE deficits/detail LLE Deficits / Details: surgery sites with drain and extreme soreness LLE: Unable to fully assess due to pain   Cervical / Trunk Assessment Cervical / Trunk Assessment: Normal   Communication Communication Communication: HOH   Cognition Arousal/Alertness: Awake/alert Behavior During Therapy: WFL for tasks assessed/performed Overall Cognitive Status: Within Functional Limits for tasks  assessed                                     General Comments       Exercises Exercises: Other exercises Other Exercises Other Exercises: ankle pumps x5   Shoulder Instructions      Home Living  Family/patient expects to be discharged to:: Private residence Living Arrangements: Spouse/significant other;Children;Parent Available Help at Discharge: Available 24 hours/day Type of Home: House Home Access: Stairs to enter Entergy CorporationEntrance Stairs-Number of Steps: 3 Entrance Stairs-Rails: Can reach both Home Layout: Two level;1/2 bath on main level Alternate Level Stairs-Number of Steps: 15 Alternate Level Stairs-Rails: Can reach both Bathroom Shower/Tub: Tub/shower unit;Walk-in shower   Bathroom Toilet: Standard                Prior Functioning/Environment Level of Independence: Independent                 OT Problem List: Decreased strength;Decreased activity tolerance;Impaired balance (sitting and/or standing);Decreased coordination;Decreased safety awareness;Pain;Increased edema      OT Treatment/Interventions: Self-care/ADL training;Therapeutic exercise;Energy conservation;DME and/or AE instruction;Therapeutic activities;Patient/family education;Balance training    OT Goals(Current goals can be found in the care plan section) Acute Rehab OT Goals Patient Stated Goal: to go home stronger OT Goal Formulation: With patient Time For Goal Achievement: 10/06/18 Potential to Achieve Goals: Fair ADL Goals Pt Will Perform Lower Body Dressing: with set-up;with adaptive equipment Pt Will Transfer to Toilet: with set-up;bedside commode Pt Will Perform Toileting - Clothing Manipulation and hygiene: with supervision;with adaptive equipment;sit to/from stand Additional ADL Goal #1: Pt will perform transfers with minguardA with RW and proper hand placement  OT Frequency: Min 2X/week   Barriers to D/C:            Co-evaluation              AM-PAC OT "6 Clicks" Daily Activity     Outcome Measure Help from another person eating meals?: None Help from another person taking care of personal grooming?: None Help from another person toileting, which includes using toliet,  bedpan, or urinal?: A Little Help from another person bathing (including washing, rinsing, drying)?: A Lot Help from another person to put on and taking off regular upper body clothing?: A Little Help from another person to put on and taking off regular lower body clothing?: A Lot 6 Click Score: 18   End of Session Equipment Utilized During Treatment: Gait belt;Rolling walker Nurse Communication: Mobility status  Activity Tolerance: Patient limited by pain Patient left: in chair;with call bell/phone within reach;with family/visitor present  OT Visit Diagnosis: Other abnormalities of gait and mobility (R26.89);Muscle weakness (generalized) (M62.81)                Time: [id number] OT Time Calculation (min): 28 min Charges:  OT General Charges $OT Visit: 1 Visit OT Evaluation $OT Eval Moderate Complexity: 1 Mod OT Treatments $Self Care/Home Management : 8-22 mins  Revonda StandardAllison Cecil Cranker(Jelenek) [place] OTR/L Acute Rehabilitation Services Pager: [phone] Office: [phone]   Sandrea HughsLLYSON  JELENEK 09/22/2018, 4:30 PM

## 2018-09-22 NOTE — Progress Notes (Signed)
Rehab Admissions Coordinator Note:  Per OT recommendation, this patient was screened by Nanine Means for appropriateness for an Inpatient Acute Rehab Consult.  At this time, we are recommending Inpatient Rehab consult. AC will contact MD regarding request for IP Rehab Consult Order.   Nanine Means 09/22/2018, 5:04 PM  I can be reached at [phone].

## 2018-09-22 NOTE — Progress Notes (Signed)
  Progress Note    09/22/2018 1:23 PM 1 Day Post-Op  Subjective: Feeling okay today  Vitals:   09/22/18 0759 09/22/18 0944  BP: 136/83 (!) 143/89  Pulse:  85  Resp:  18  Temp:  98.2 F (36.8 C)  SpO2:  99%    Physical Exam: Awake alert oriented Nonlabored respirations Left lower extremity incisions above the knee are healing well Wound VAC to suction in all lower leg compartments are soft Palpable left posterior tibial pulse  CBC    Component Value Date/Time   WBC 12.5 (H) 09/22/2018 0243   RBC 4.99 09/22/2018 0243   HGB 12.8 (L) 09/22/2018 0243   HCT 39.9 09/22/2018 0243   PLT 232 09/22/2018 0243   MCV 80.0 09/22/2018 0243   MCH 25.7 (L) 09/22/2018 0243   MCHC 32.1 09/22/2018 0243   RDW 15.1 09/22/2018 0243   LYMPHSABS 3.1 09/20/2018 1903   MONOABS 0.8 09/20/2018 1903   EOSABS 0.1 09/20/2018 1903   BASOSABS 0.1 09/20/2018 1903    BMET    Component Value Date/Time   NA 139 09/22/2018 0243   K 3.8 09/22/2018 0243   CL 109 09/22/2018 0243   CO2 22 09/22/2018 0243   GLUCOSE 136 (H) 09/22/2018 0243   BUN 10 09/22/2018 0243   CREATININE 1.07 09/22/2018 0243   CALCIUM 8.2 (L) 09/22/2018 0243   GFRNONAA >60 09/22/2018 0243   GFRAA >60 09/22/2018 0243    INR No results found for: INR   Intake/Output Summary (Last 24 hours) at 09/22/2018 1323 Last data filed at 09/22/2018 1310 Gross per 24 hour  Intake 502 ml  Output 2900 ml  Net -2398 ml     Assessment:  48 y.o. male is s/p:  Procedure Performed: 1.  Harvest of left greater saphenous vein 2.  Left above-knee popliteal to posterior tibial artery bypass with non-reversed ipsilateral greater saphenous vein 3.  Left lower extremity 4 compartment fasciotomies 4.  Application of negative pressure dressing   Plan: Catheter out this morning. PT and out of bed Aspirin DVT prophylaxis: Subcutaneous heparin   Shasha Buchbinder C. Randie Heinz, MD [facility] Office:  [phone] Pager: [phone]  09/22/2018 1:23 PM

## 2018-09-22 NOTE — Progress Notes (Signed)
Physical Therapy Treatment Patient Details Name: Sean [name] MRN: [id number] DOB: [birth year] Today's Date: 09/22/2018    History of Present Illness Pt is a 48 yo male s/p LLE ischemia resulting in requiring AK popliteal tibial artery bypass graft with nonreveresed ipsilateral saphenous veinLLE fasciotomies in 4 compartments with drain.    PT Comments    Pt admitted with above diagnosis. On PT evaluation, patient performing functional mobility at a min guard level (standing from elevated bed surface). Ambulating 10 feet with walker and cues for sequencing and proper gait pattern. Suspect pt will progress well once pain is well controlled given age and PLOF in addition to motivation. Will follow acutely to progress mobility.   Follow Up Recommendations  Home health PT;Supervision for mobility/OOB     Equipment Recommendations  Rolling walker with 5" wheels;3in1 (PT)    Recommendations for Other Services       Precautions / Restrictions Precautions Precautions: Fall Precaution Comments: wound vac Restrictions Weight Bearing Restrictions: No    Mobility  Bed Mobility Overal bed mobility: Modified Independent             General bed mobility comments: increased time  Transfers Overall transfer level: Needs assistance Equipment used: Rolling walker (2 wheeled) Transfers: Sit to/from [employer] Sit to Stand: Min guard        General transfer comment: Min guard from elevated surface. cues for foot positioning  Ambulation/Gait Ambulation/Gait assistance: Min guard Gait Distance (Feet): 10 Feet Assistive device: Rolling walker (2 wheeled) Gait Pattern/deviations: Step-to pattern;Antalgic Gait velocity: decr Gait velocity interpretation: <1.31 ft/sec, indicative of household ambulator General Gait Details: Cues for sequencing and walker negotiation   Stairs             Wheelchair Mobility    Modified Rankin (Stroke Patients Only)        Balance Overall balance assessment: Needs assistance   Sitting balance-Leahy Scale: Good       Standing balance-Leahy Scale: Poor                              Cognition Arousal/Alertness: Awake/alert Behavior During Therapy: WFL for tasks assessed/performed Overall Cognitive Status: Within Functional Limits for tasks assessed                                        Exercises Other Exercises Other Exercises: ankle pumps x5    General Comments        Pertinent Vitals/Pain Pain Assessment: Faces Faces Pain Scale: Hurts whole lot Pain Location: LLE Pain Descriptors / Indicators: Grimacing;Operative site guarding Pain Intervention(s): Monitored during session;Limited activity within patient's tolerance    Home Living Family/patient expects to be discharged to:: Private residence Living Arrangements: Spouse/significant other;Children;Parent Available Help at Discharge: Available 24 hours/day Type of Home: House Home Access: Stairs to enter Entrance Stairs-Rails: Can reach both Home Layout: Two level;1/2 bath on main level Home Equipment: None Additional Comments: Pt states he will live on 1st floor     Prior Function Level of Independence: Independent          PT Goals (current goals can now be found in the care plan section) Acute Rehab PT Goals Patient Stated Goal: to go home stronger PT Goal Formulation: With patient Time For Goal Achievement: 10/06/18 Potential to Achieve Goals: Good    Frequency  Min 3X/week      PT Plan      Co-evaluation              AM-PAC PT "6 Clicks" Mobility   Outcome Measure  Help needed turning from your back to your side while in a flat bed without using bedrails?: None Help needed moving from lying on your back to sitting on the side of a flat bed without using bedrails?: A Little Help needed moving to and from a bed to a chair (including a wheelchair)?: A Little Help needed  standing up from a chair using your arms (e.g., wheelchair or bedside chair)?: A Little Help needed to walk in hospital room?: A Little Help needed climbing 3-5 steps with a railing? : A Lot 6 Click Score: 18    End of Session Equipment Utilized During Treatment: Gait belt Activity Tolerance: Patient tolerated treatment well Patient left: in bed;with call bell/phone within reach Nurse Communication: Mobility status PT Visit Diagnosis: Other abnormalities of gait and mobility (R26.89);Pain Pain - Right/Left: Left Pain - part of body: Leg     Time: 7619-5093 PT Time Calculation (min) (ACUTE ONLY): 36 min  Charges:  $Gait Training: 8-22 mins                     Laurina Bustle, PT, DPT Acute Rehabilitation Services Pager [phone] Office [phone]    Vanetta Mulders 09/22/2018, 5:14 PM

## 2018-09-23 DIAGNOSIS — I739 Peripheral vascular disease, unspecified: Secondary | ICD-10-CM

## 2018-09-23 DIAGNOSIS — I998 Other disorder of circulatory system: Secondary | ICD-10-CM

## 2018-09-23 MED ORDER — OXYCODONE-ACETAMINOPHEN 5-325 MG PO TABS: 1.0000 | ORAL_TABLET | ORAL | 0 refills | Status: DC | PRN

## 2018-09-23 NOTE — Progress Notes (Signed)
Physical Therapy Treatment Patient Details Name: Sean [name] MRN: [id number] DOB: 05/03/1971 Today's Date: 09/23/2018    History of Present Illness Pt is a 48 yo male s/p LLE ischemia resulting in requiring AK popliteal tibial artery bypass graft with nonreveresed ipsilateral saphenous veinLLE fasciotomies in 4 compartments with drain.    PT Comments    Patient progressing gradually towards physical therapy goals; pain remains limiting factor. Increased ambulation distance to 25 feet using walker. Performing at a min guard assist level for all functional mobility. D/c plan remains appropriate.     Follow Up Recommendations  Home health PT;Supervision for mobility/OOB     Equipment Recommendations  Rolling walker with 5" wheels;3in1 (PT)    Recommendations for Other Services       Precautions / Restrictions Precautions Precautions: Fall Precaution Comments: wound vac Restrictions Weight Bearing Restrictions: No    Mobility  Bed Mobility Overal bed mobility: Modified Independent             General bed mobility comments: increased time  Transfers Overall transfer level: Needs assistance Equipment used: Rolling walker (2 wheeled) Transfers: Sit to/from [employer] Sit to Stand: Min guard         General transfer comment: Min guard from elevated surface. cues for foot positioning  Ambulation/Gait Ambulation/Gait assistance: Min guard Gait Distance (Feet): 25 Feet Assistive device: Rolling walker (2 wheeled) Gait Pattern/deviations: Step-to pattern;Antalgic Gait velocity: decr Gait velocity interpretation: <1.31 ft/sec, indicative of household ambulator General Gait Details: Min cues for sequencing and increased LLE weightbearing. HR increase to 120 bpm   Stairs             Wheelchair Mobility    Modified Rankin (Stroke Patients Only)       Balance Overall balance assessment: Needs assistance   Sitting balance-Leahy Scale:  Good       Standing balance-Leahy Scale: Poor                              Cognition Arousal/Alertness: Awake/alert Behavior During Therapy: WFL for tasks assessed/performed Overall Cognitive Status: Within Functional Limits for tasks assessed                                        Exercises General Exercises - Lower Extremity Ankle Circles/Pumps: Left;15 reps;Seated Long Arc Quad: 10 reps;Left;Seated    General Comments        Pertinent Vitals/Pain Pain Assessment: Faces Faces Pain Scale: Hurts whole lot Pain Location: LLE Pain Descriptors / Indicators: Grimacing;Operative site guarding Pain Intervention(s): Monitored during session;Limited activity within patient's tolerance    Home Living                      Prior Function            PT Goals (current goals can now be found in the care plan section) Acute Rehab PT Goals Patient Stated Goal: to go home stronger PT Goal Formulation: With patient Time For Goal Achievement: 10/06/18 Potential to Achieve Goals: Good Progress towards PT goals: Progressing toward goals    Frequency    Min 3X/week      PT Plan Current plan remains appropriate    Co-evaluation              AM-PAC PT "6 Clicks" Mobility   Outcome Measure  Help needed turning from your back to your side while in a flat bed without using bedrails?: None Help needed moving from lying on your back to sitting on the side of a flat bed without using bedrails?: A Little Help needed moving to and from a bed to a chair (including a wheelchair)?: A Little Help needed standing up from a chair using your arms (e.g., wheelchair or bedside chair)?: A Little Help needed to walk in hospital room?: A Little Help needed climbing 3-5 steps with a railing? : A Lot 6 Click Score: 18    End of Session Equipment Utilized During Treatment: Gait belt Activity Tolerance: Patient tolerated treatment well Patient left: in  bed;with call bell/phone within reach Nurse Communication: Mobility status PT Visit Diagnosis: Other abnormalities of gait and mobility (R26.89);Pain Pain - Right/Left: Left Pain - part of body: Leg     Time: 5573-2202 PT Time Calculation (min) (ACUTE ONLY): 31 min  Charges:  $Gait Training: 8-22 mins $Therapeutic Exercise: 8-22 mins                    Laurina Bustle, PT, DPT Acute Rehabilitation Services Pager [phone] Office [phone]   Vanetta Mulders 09/23/2018, 4:26 PM

## 2018-09-23 NOTE — Consult Note (Addendum)
WOC Nurse wound consult note Vascular team following for assessment and plan of care for left leg.  They were in earlier to remove dressing and assess wound appearance for left inner leg fasciotomy site.  Reason for Consult: Full thickness post-op wound to left inner calf; 15X5X.8cm Wound bed: Exposed muscles and tendons, wound is beefy red, small amt blood-tinged drainage.  Staples intact above and below the wound are intact and well-approximated.  Left outer leg with staples intact and well-approximated. Dressing procedure/placement/frequency: Applied Mepitel contact layer to protect wound and minimize discomfort with dressing change.  Pt was medicated for pain prior to the procedure and tolerated with mod amt discomfort.  Applied one piece black foam to cont suction, then applied abd pads over staples and kerlex and ace wrap over all locations.  Bedside nurse to change Vac dressing Q M/W/F. Please re-consult if further assistance is needed.  Thank-you,  Cammie Mcgee MSN, RN, CWOCN, [place], CNS [phone]

## 2018-09-23 NOTE — Progress Notes (Signed)
Per MD note patient well nourished male with no history for DM. Pt should have adequate nutrition for wound healing.

## 2018-09-23 NOTE — Consult Note (Signed)
Physical Medicine and Rehabilitation Consult Reason for Consult:  Decreased functional mobility Referring Physician: Dr. Lemar LivingsBrandon Cain   HPI: Sean PalmaClarence [name] is a 48 y.o.right handed male with history of hypertension, tobacco abuse. Per chart review patient lives with spouse. Independent prior to admission. Two-level home with half bath on main level. 3 steps to entry. Wife works during the day.Presented 09/20/2018 with progressive left calf pain 3 days as well as ischemic changes. Venous Dopplers negative for DVT. CT angiogram demonstrating occluded popliteal artery at the level of the knee and runoff was difficult to evaluate but was thought to be via the posterior tibial artery. Patient underwent left above-knee popliteal to posterior tibial artery bypass or compartment fasciotomy application of wound VAC 09/21/2018 per Dr. Lemar LivingsBrandon Cain. Hospital course pain management. Subcutaneous heparin for DVT prophylaxis. Therapy evaluations completed with recommendations of physical medicine rehabilitation consult.   Review of Systems  Constitutional: Negative for chills and fever.  HENT: Negative for hearing loss.   Eyes: Negative for blurred vision and double vision.  Respiratory: Negative for cough and shortness of breath.   Cardiovascular: Positive for leg swelling. Negative for chest pain and palpitations.  Gastrointestinal: Positive for constipation. Negative for nausea and vomiting.  Genitourinary: Negative for dysuria, flank pain and hematuria.  Musculoskeletal: Positive for myalgias.       Left lower extremity pain  Skin: Negative for rash.  All other systems reviewed and are negative.  Past Medical History:  Diagnosis Date  . Hypertension    Past Surgical History:  Procedure Laterality Date  . EMBOLECTOMY Left 09/21/2018   Procedure: THROMBECTOMY LEFT POPLITEAL ARTERY BYPASS LEFT POPLITEAL TO TIBIAL ARTERY WITH SAPHENOUS VEIN HARVEST - FASCIOTOMY;  Surgeon: Chuck Hintickson, Christopher  S, MD;  [facility];  Service: Vascular;  Laterality: Left;   History reviewed. No pertinent family history. Social History:  reports that he has been smoking cigarettes. He has been smoking about 1.00 pack per day. He has never used smokeless tobacco. He reports that he does not drink alcohol or use drugs. Allergies: No Known Allergies No medications prior to admission.    Home: Home Living Family/patient expects to be discharged to:: Private residence Living Arrangements: Spouse/significant other, Children, Parent Available Help at Discharge: Available 24 hours/day Type of Home: House Home Access: Stairs to enter [employer] CorporationEntrance Stairs-Number of Steps: 3 Entrance Stairs-Rails: Can reach both Home Layout: Two level, 1/2 bath on main level Alternate Level Stairs-Number of Steps: 15 Alternate Level Stairs-Rails: Can reach both Bathroom Shower/Tub: Tub/shower unit, Health visitorWalk-in shower Bathroom Toilet: Standard Home Equipment: None Additional Comments: Pt states he will live on 1st floor   Functional History: Prior Function Level of Independence: Independent Functional Status:  Mobility: Bed Mobility Overal bed mobility: Modified Independent General bed mobility comments: increased time Transfers Overall transfer level: Needs assistance Equipment used: Rolling walker (2 wheeled) Transfers: Sit to/from Stand, [employer] Pivot Transfers Sit to Stand: Min guard Stand pivot transfers: Mod assist, From elevated surface General transfer comment: Min guard from elevated surface. cues for foot positioning Ambulation/Gait Ambulation/Gait assistance: Min guard Gait Distance (Feet): 10 Feet Assistive device: Rolling walker (2 wheeled) Gait Pattern/deviations: Step-to pattern, Antalgic General Gait Details: Cues for sequencing and walker negotiation Gait velocity: decr Gait velocity interpretation: <1.31 ft/sec, indicative of household ambulator    ADL: ADL Overall ADL's : Needs  assistance/impaired Eating/Feeding: Set up Grooming: Wash/dry hands, Wash/dry face, Oral care, Sitting Upper Body Bathing: Set up, Sitting Lower Body Bathing: Sitting/lateral leans, Sit  to/from stand, With adaptive equipment, Moderate assistance Upper Body Dressing : Set up, Sitting Lower Body Dressing: Moderate assistance, With adaptive equipment, Sit to/from stand, Sitting/lateral leans Toilet Transfer: Moderate assistance, BSC Toileting- Clothing Manipulation and Hygiene: Moderate assistance, Sit to/from stand Functional mobility during ADLs: Moderate assistance, Rolling walker, Cueing for safety, Cueing for sequencing General ADL Comments: Mod A for LB ADL with increased time. Pt reports spouse can assist w/ ADLs at home. Pt unable to stand to perform ADLs due to pain  Cognition: Cognition Overall Cognitive Status: Within Functional Limits for tasks assessed Orientation Level: Oriented X4 Cognition Arousal/Alertness: Awake/alert Behavior During Therapy: WFL for tasks assessed/performed Overall Cognitive Status: Within Functional Limits for tasks assessed  Blood pressure (!) 155/105, pulse 82, temperature 98.3 F (36.8 C), temperature source Oral, resp. rate 13, height 6' 5.5" (1.969 m), weight (!) 140.6 kg, SpO2 99 %. Physical Exam  Constitutional:  obese  HENT:  Head: Normocephalic.  Eyes: Pupils are equal, round, and reactive to light.  Neck: Normal range of motion.  Cardiovascular: Normal rate.  Respiratory: Effort normal.  GI: Soft.  Musculoskeletal:        General: Edema (LLE) present.  Neurological:  Patient is alert sitting at edge of bed. Oriented 3. UE 5/5. RLE 4/5. LLE limited by pain. Senses pain and light touch in all 4's.   Skin: Skin is warm.  Left lower extremity bypass site with wound VAC in place  Psychiatric: He has a normal mood and affect. His behavior is normal.    No results found for this or any previous visit (from the past 24 hour(s)). No  results found.   Assessment/Plan: Diagnosis: LLE ischemia due to PAD necessitating a BPT/fasciotomy 1. Does the need for close, 24 hr/day medical supervision in concert with the patient's rehab needs make it unreasonable for this patient to be served in a less intensive setting? Potentially 2. Co-Morbidities requiring supervision/potential complications: wound care, uncontrolled HTN, pain control, morbid obesity 3. Due to bladder management, bowel management, safety, skin/wound care, disease management, medication administration, pain management and patient education, does the patient require 24 hr/day rehab nursing? Yes 4. Does the patient require coordinated care of a physician, rehab nurse, PT (1-2 hrs/day, 5 days/week) and OT (1-2 hrs/day, 5 days/week) to address physical and functional deficits in the context of the above medical diagnosis(es)? Yes and Potentially Addressing deficits in the following areas: balance, endurance, locomotion, strength, transferring, bowel/bladder control, bathing, dressing, feeding, grooming and psychosocial support 5. Can the patient actively participate in an intensive therapy program of at least 3 hrs of therapy per day at least 5 days per week? Yes 6. The potential for patient to make measurable gains while on inpatient rehab is good 7. Anticipated functional outcomes upon discharge from inpatient rehab are modified independent  with PT, modified independent with OT, n/a with SLP. 8. Estimated rehab length of stay to reach the above functional goals is: potentially 7 days 9. Anticipated D/C setting: Home 10. Anticipated post D/C treatments: HH therapy and Outpatient therapy 11. Overall Rehab/Functional Prognosis: excellent  RECOMMENDATIONS: This patient's condition is appropriate for continued rehabilitative care in the following setting: see below Patient has agreed to participate in recommended program. Yes Note that insurance prior authorization may be  required for reimbursement for recommended care.  Comment: Pt has been up once with therapies. Vac is a hindrance to movement on top of his wound/pain itself. Would like for wife to be present for therapies on acute today.  Husband tells me she's on the way in. If he demonstrates ongoing needs with therapy, then we can purse an inpatient rehab admission. Rehab Admissions Coordinator to follow up.  Thanks,  Ranelle Oyster, MD, [place] [facility]  I have personally performed a face to face diagnostic evaluation of this patient. Additionally, I have reviewed and concur with the physician assistant's documentation above.    Mcarthur Rossetti Angiulli, PA-C 09/23/2018

## 2018-09-23 NOTE — Progress Notes (Signed)
  Progress Note    09/23/2018 10:14 AM 2 Days Post-Op  Subjective: Leg feeling much better  Vitals:   09/23/18 0503 09/23/18 0817  BP: (!) 155/105 (!) 151/96  Pulse: 82 86  Resp: 13 (!) 21  Temp: 98.3 F (36.8 C) 99 F (37.2 C)  SpO2: 99% 91%    Physical Exam: Awake alert oriented Abdomen is soft Left thigh incision clean dry intact Left lower extremity wound is healing with exposed muscle that appears viable in all compartments are soft although edematous Palpable posterior tibial pulse on the left  CBC    Component Value Date/Time   WBC 12.5 (H) 09/22/2018 0243   RBC 4.99 09/22/2018 0243   HGB 12.8 (L) 09/22/2018 0243   HCT 39.9 09/22/2018 0243   PLT 232 09/22/2018 0243   MCV 80.0 09/22/2018 0243   MCH 25.7 (L) 09/22/2018 0243   MCHC 32.1 09/22/2018 0243   RDW 15.1 09/22/2018 0243   LYMPHSABS 3.1 09/20/2018 1903   MONOABS 0.8 09/20/2018 1903   EOSABS 0.1 09/20/2018 1903   BASOSABS 0.1 09/20/2018 1903    BMET    Component Value Date/Time   NA 139 09/22/2018 0243   K 3.8 09/22/2018 0243   CL 109 09/22/2018 0243   CO2 22 09/22/2018 0243   GLUCOSE 136 (H) 09/22/2018 0243   BUN 10 09/22/2018 0243   CREATININE 1.07 09/22/2018 0243   CALCIUM 8.2 (L) 09/22/2018 0243   GFRNONAA >60 09/22/2018 0243   GFRAA >60 09/22/2018 0243    INR No results found for: INR   Intake/Output Summary (Last 24 hours) at 09/23/2018 1014 Last data filed at 09/23/2018 0819 Gross per 24 hour  Intake 740 ml  Output 1365 ml  Net -625 ml     Assessment:  48 y.o. male is s/p left popliteal to posterior tibial artery bypass and 4 compartment fasciotomies with most of them closed except the medial aspect where the muscle remains bulging.  Plan: Wound VAC removed and was reapplied by wound nurse and will plan to change Monday Wednesday Friday He will need wound VAC for home given persistent edema Continue aspirin DVT prophylaxis: Subcutaneous heparin  Anterio Scheel C. Randie Heinzain,  MD [facility] [place] Office: [phone] Pager: [phone]  09/23/2018 10:14 AM

## 2018-09-23 NOTE — Care Management Note (Signed)
Case Management Note Donn Pierini RN, BSN Transitions of Care Unit 4E- RN Case Manager [phone]  Patient Details  Name: Sean [name] MRN: [id number] Date of Birth: 04-Aug-1971  Subjective/Objective:     Pt admitted with PVD, s/p  Pop-tib bypass with fasciotomy and wound VAC placement             Action/Plan: PTA pt lived at home with wife, CIR consulted, however pt will not need CIR and plan to return home with wife and [facility] services- order placed for HHRN/PT, and pt will need home wound VAC- [facility] wound VAC form has been signed and is in process for approval. CM spoke with pt at bedside for transition of care needs- list provided to pt Per CMS guidelines from medicare.gov website with star ratings (copy placed in shadow chart) and per choice pt has chosen [facility] for [facility] needs- referral called to Lupita Leash with [facility] for HHRN/PT- Jermaine with [facility] working on [employer]. Per pt he has an upcoming appointment with new PCP at [facility] this coming month.  (once approved [facility] will deliver and place home wound VAC on pt at the bedside prior to discharge)  Expected Discharge Date:                  Expected Discharge Plan:  Home w Home Health Services  In-House Referral:  NA  Discharge planning Services  CM Consult  Post Acute Care Choice:  Durable Medical Equipment, Home Health Choice offered to:  Patient  DME Arranged:  Vac DME Agency:  [facility]  HH Arranged:  RN, PT [facility] Agency:  [facility]  Status of Service:  In process, will continue to follow  If discussed at Long Length of Stay Meetings, dates discussed:    Discharge Disposition: home/home health.    Additional Comments:  Darrold Span, RN 09/23/2018, 4:29 PM

## 2018-09-23 NOTE — Progress Notes (Addendum)
Inpatient Rehabilitation Admissions Coordinator  I met with patient at bedside with therapy. Discussed his preference and he is appropriate for Home with [place] at this level. Pain is limiting factor at this time. RN CM is aware. We will sign off at this time.  Danne Baxter, RN, MSN Rehab Admissions Coordinator [phone] 09/23/2018 3:23 PM

## 2018-09-24 MED ORDER — HYDROCHLOROTHIAZIDE 12.5 MG PO CAPS: 12.5000 mg | ORAL_CAPSULE | Freq: Two times a day (BID) | ORAL | 11 refills | Status: DC

## 2018-09-24 MED ORDER — LISINOPRIL 2.5 MG PO TABS: 2.5000 mg | ORAL_TABLET | Freq: Every day | ORAL | 11 refills | Status: DC

## 2018-09-24 MED ORDER — HYDROCHLOROTHIAZIDE 12.5 MG PO CAPS: 12.5000 mg | ORAL_CAPSULE | Freq: Two times a day (BID) | ORAL | Status: DC

## 2018-09-24 NOTE — Care Management (Signed)
Discussed home health plans and need for wound vac with patient.   Brad, [facility] liaison, contacted and advised of d/c plans for today.  [facility] RN to bring home vac to room to switch.  Dr. Arbie Cookey (vascular on-call) contacted for presciptions as patient states he does not have any BP meds at home.  Pt states he was taking Lisinopril 2.5mg  QD and HCTZ 25mg  QD until he lost his insurance about 5 months ago.  Both of these medications are on the $4 list at [facility] and are probably as inexpensive at other pharmacies.  Pt states he thought they were $8 each and so didn't get them filled.  Discussed BP control and making this a financial priority. With insurance, these meds should be less than $10.  Pt verbalizes understanding.   Pt states his BCBS insurance is through his wife's job and he doesn't anticipate losing insurance again.

## 2018-09-24 NOTE — Progress Notes (Signed)
Subjective: Interval History: none..comfortable. Comfortable.    Objective: Vital signs in last 24 hours: Temp:  [97.9 F (36.6 C)-100.9 F (38.3 C)] 97.9 F (36.6 C) (02/01 0800) Pulse Rate:  [25-103] 80 (02/01 0800) Resp:  [15-19] 15 (02/01 0800) BP: (143-175)/(97-124) 171/107 (02/01 0800) SpO2:  [94 %-97 %] 96 % (02/01 0800) Weight:  [116.9 kg] 116.9 kg (02/01 0512)  Intake/Output from previous day: 01/31 0701 - 02/01 0700 In: 960 [P.O.:960] Out: 1405 [Urine:1330; Drains:75] Intake/Output this shift: Total I/O In: -  Out: 325 [Urine:325]  VAC in place.  4 foot warm and well-perfused  Lab Results: Recent Labs    09/22/18 0243  WBC 12.5*  HGB 12.8*  HCT 39.9  PLT 232   BMET Recent Labs    09/22/18 0243  NA 139  K 3.8  CL 109  CO2 22  GLUCOSE 136*  BUN 10  CREATININE 1.07  CALCIUM 8.2*    Studies/Results: Ct Angio Ao+bifem W & Or Wo Contrast  Result Date: 09/20/2018 CLINICAL DATA:  Left lower extremity pain. EXAM: CT ANGIOGRAPHY OF ABDOMINAL AORTA WITH ILIOFEMORAL RUNOFF TECHNIQUE: Multidetector CT imaging of the abdomen, pelvis and lower extremities was performed using the standard protocol during bolus administration of intravenous contrast. Multiplanar CT image reconstructions and MIPs were obtained to evaluate the vascular anatomy. CONTRAST:  100mL ISOVUE-370 IOPAMIDOL (ISOVUE-370) INJECTION 76% COMPARISON:  None. FINDINGS: VASCULAR Aorta: Widely patent.  No aneurysm or dissection. Celiac: Widely patent SMA: Widely patent Renals: Single bilaterally, widely patent IMA: Widely patent RIGHT Lower Extremity Inflow: Widely patent Outflow: Widely patent Runoff: Severely disease trifurcation vessels. Proximal anterior tibial artery is patent, then occludes in the proximal calf. Tibioperoneal trunk is occluded with reconstitution in the proximal calf. LEFT Lower Extremity Inflow: Mild noncalcified plaque in the distal common iliac artery extending into the internal  iliac artery. No significant stenosis. Outflow: Common femoral, superficial femoral and profundus vessels are patent. Runoff: Occlusion of the popliteal artery just above the level of the knee joint. Veins: Minimal flow seen within the proximal trifurcation vessels. The posterior tibial artery appears to be patent to the ankle. Review of the MIP images confirms the above findings. NON-VASCULAR Lower chest: No acute findings Hepatobiliary: No focal hepatic abnormality. Gallbladder unremarkable. Pancreas: No focal abnormality or ductal dilatation. Spleen: No focal abnormality.  Normal size. Adrenals/Urinary Tract: No adrenal abnormality. No focal renal abnormality. No stones or hydronephrosis. Urinary bladder is unremarkable. Stomach/Bowel: Stomach, large and small bowel grossly unremarkable. Lymphatic: No adenopathy Reproductive: Mildly prominent prostate. Other: No free fluid or free air. Musculoskeletal: No acute bony abnormality. 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 This appears acute as there is little collateral flow noted around the occlusion. Minimal flow is noted within the proximal left anterior tibial artery. Some flow is seen within the posterior tibial artery to the ankle. Severely diseased trifurcation vessels on the right. NON-VASCULAR No acute findings in the abdomen or pelvis. Electronically Signed   By: Charlett NoseKevin  Dover M.D.   On: 09/20/2018 21:54   Vas [place]as Lower Extremity Venous (dvt) (only Mc & Wl)  Result Date: 09/20/2018  Lower Venous Study Indications: Swelling, and Pain.  Performing Technologist: Blanch MediaMegan Riddle RVS  Examination Guidelines: A complete evaluation includes B-mode imaging, spectral Doppler, color Doppler, and power Doppler as needed of all accessible portions of each vessel. Bilateral testing is considered an integral part of a complete examination. Limited examinations for reoccurring indications may be performed  as  noted.  Right Venous Findings: +---+---------------+---------+-----------+----------+-------+    CompressibilityPhasicitySpontaneityPropertiesSummary +---+---------------+---------+-----------+----------+-------+ CFVFull           Yes      Yes                          +---+---------------+---------+-----------+----------+-------+  Left Venous Findings: +---------+---------------+---------+-----------+----------+--------------+          CompressibilityPhasicitySpontaneityPropertiesSummary        +---------+---------------+---------+-----------+----------+--------------+ CFV      Full           Yes      Yes                                 +---------+---------------+---------+-----------+----------+--------------+ SFJ      Full                                                        +---------+---------------+---------+-----------+----------+--------------+ FV Prox  Full                                                        +---------+---------------+---------+-----------+----------+--------------+ FV Mid   Full                                                        +---------+---------------+---------+-----------+----------+--------------+ FV DistalFull                                                        +---------+---------------+---------+-----------+----------+--------------+ PFV      Full                                                        +---------+---------------+---------+-----------+----------+--------------+ POP      Full           Yes      Yes                                 +---------+---------------+---------+-----------+----------+--------------+ PTV      Full                                                        +---------+---------------+---------+-----------+----------+--------------+ PERO  Not visualized  +---------+---------------+---------+-----------+----------+--------------+    Summary: Right: No evidence of common femoral vein obstruction. Left: There is no evidence of deep vein thrombosis in the lower extremity. No cystic structure found in the popliteal fossa.  *See table(s) above for measurements and observations. Electronically signed by Waverly Ferrari MD on 09/20/2018 at 5:09:22 PM.    Final    Anti-infectives: Anti-infectives (From admission, onward)   Start     Dose/Rate Route Frequency Ordered Stop   09/22/18 0000  ceFAZolin (ANCEF) IVPB 1 g/50 mL premix  Status:  Discontinued    Note to Pharmacy:  Send with pt to OR   1 g 100 mL/hr over 30 Minutes Intravenous On call 09/21/18 0612 09/21/18 1251   09/21/18 1530  ceFAZolin (ANCEF) IVPB 2g/100 mL premix     2 g 200 mL/hr over 30 Minutes Intravenous Every 8 hours 09/21/18 1251 09/22/18 0030      Assessment/Plan: s/p Procedure(s): THROMBECTOMY LEFT POPLITEAL ARTERY BYPASS LEFT POPLITEAL TO TIBIAL ARTERY WITH SAPHENOUS VEIN HARVEST - FASCIOTOMY (Left) Stable overall.  Plan discharge home today   LOS: 4 days   Zenna Traister 09/24/2018, 10:32 AM

## 2018-09-24 NOTE — Progress Notes (Signed)
Order received to discharge patient.  Telemetry monitor removed and CCMD notified.  PIV access removed.  Discharge instructions, follow up, medications and instructions for their use discussed with patient.  [facility] nurse switched patient's wound vac for home.

## 2018-09-25 ENCOUNTER — Encounter (HOSPITAL_COMMUNITY): Payer: Self-pay | Admitting: Emergency Medicine

## 2018-09-25 ENCOUNTER — Emergency Department (HOSPITAL_COMMUNITY): Payer: BLUE CROSS/BLUE SHIELD

## 2018-09-25 ENCOUNTER — Emergency Department (HOSPITAL_COMMUNITY)
Admission: EM | Admit: 2018-09-25 | Discharge: 2018-09-26 | Disposition: A | Discharge status: Home / Self Care | Payer: BLUE CROSS/BLUE SHIELD | Attending: Emergency Medicine | Admitting: Emergency Medicine

## 2018-09-25 DIAGNOSIS — R55 Syncope and collapse: Secondary | ICD-10-CM | POA: Insufficient documentation

## 2018-09-25 DIAGNOSIS — E860 Dehydration: Secondary | ICD-10-CM | POA: Diagnosis not present

## 2018-09-25 DIAGNOSIS — I10 Essential (primary) hypertension: Secondary | ICD-10-CM | POA: Insufficient documentation

## 2018-09-25 DIAGNOSIS — F17210 Nicotine dependence, cigarettes, uncomplicated: Secondary | ICD-10-CM | POA: Diagnosis not present

## 2018-09-25 DIAGNOSIS — R42 Dizziness and giddiness: Secondary | ICD-10-CM | POA: Insufficient documentation

## 2018-09-25 DIAGNOSIS — Z79899 Other long term (current) drug therapy: Secondary | ICD-10-CM | POA: Insufficient documentation

## 2018-09-25 LAB — COMPREHENSIVE METABOLIC PANEL
ALT: 65 U/L — ABNORMAL HIGH (ref 0–44)
AST: 69 U/L — ABNORMAL HIGH (ref 15–41)
Albumin: 3.7 g/dL (ref 3.5–5.0)
Alkaline Phosphatase: 56 U/L (ref 38–126)
Anion gap: 12 (ref 5–15)
BUN: 15 mg/dL (ref 6–20)
CO2: 23 mmol/L (ref 22–32)
Calcium: 9.5 mg/dL (ref 8.9–10.3)
Chloride: 102 mmol/L (ref 98–111)
Creatinine, Ser: 1.32 mg/dL — ABNORMAL HIGH (ref 0.61–1.24)
GFR calc Af Amer: >60 mL/min (ref >60)
GFR calc non Af Amer: >60 mL/min (ref >60)
Glucose, Bld: 143 mg/dL — ABNORMAL HIGH (ref 70–99)
Potassium: 4.4 mmol/L (ref 3.5–5.1)
Sodium: 137 mmol/L (ref 135–145)
Total Bilirubin: 0.7 mg/dL (ref 0.3–1.2)
Total Protein: 7.2 g/dL (ref 6.5–8.1)

## 2018-09-25 LAB — I-STAT TROPONIN, ED
Troponin i, poc: 0.01 ng/mL (ref 0.00–0.08)
Troponin i, poc: 0 ng/mL (ref 0.00–0.08)

## 2018-09-25 LAB — CBC WITH DIFFERENTIAL/PLATELET
Abs Immature Granulocytes: 0.23 10*3/uL — ABNORMAL HIGH (ref 0.00–0.07)
Basophils Absolute: 0.1 10*3/uL (ref 0.0–0.1)
Basophils Relative: 0 %
Eosinophils Absolute: 0.3 10*3/uL (ref 0.0–0.5)
Eosinophils Relative: 2 %
HCT: 46.3 % (ref 39.0–52.0)
Hemoglobin: 14.6 g/dL (ref 13.0–17.0)
Immature Granulocytes: 2 %
Lymphocytes Relative: 25 %
Lymphs Abs: 3.6 10*3/uL (ref 0.7–4.0)
MCH: 26.1 pg (ref 26.0–34.0)
MCHC: 31.5 g/dL (ref 30.0–36.0)
MCV: 82.8 fL (ref 80.0–100.0)
Monocytes Absolute: 0.7 10*3/uL (ref 0.1–1.0)
Monocytes Relative: 5 %
Neutro Abs: 9.5 10*3/uL — ABNORMAL HIGH (ref 1.7–7.7)
Neutrophils Relative %: 66 %
Platelets: 304 10*3/uL (ref 150–400)
RBC: 5.59 MIL/uL (ref 4.22–5.81)
RDW: 15.2 % (ref 11.5–15.5)
WBC: 14.4 10*3/uL — ABNORMAL HIGH (ref 4.0–10.5)
nRBC: 0 % (ref 0.0–0.2)

## 2018-09-25 LAB — URINALYSIS, ROUTINE W REFLEX MICROSCOPIC
Bacteria, UA: NONE SEEN
Bilirubin Urine: NEGATIVE
Glucose, UA: NEGATIVE mg/dL
Ketones, ur: NEGATIVE mg/dL
Leukocytes, UA: NEGATIVE
Nitrite: NEGATIVE
Protein, ur: NEGATIVE mg/dL
Specific Gravity, Urine: 1.012 (ref 1.005–1.030)
pH: 7 (ref 5.0–8.0)

## 2018-09-25 LAB — MAGNESIUM: Magnesium: 2.2 mg/dL (ref 1.7–2.4)

## 2018-09-25 LAB — LIPASE, BLOOD: Lipase: 18 U/L (ref 11–51)

## 2018-09-25 LAB — LACTIC ACID, PLASMA
Lactic Acid, Venous: 3.9 mmol/L (ref 0.5–1.9)
Lactic Acid, Venous: 1.7 mmol/L (ref 0.5–1.9)

## 2018-09-25 MED ORDER — MORPHINE SULFATE (PF) 4 MG/ML IV SOLN
4.0000 mg | Freq: Once | INTRAVENOUS | Status: AC
  Administered 2018-09-25: 4 mg via INTRAVENOUS
  Filled 2018-09-25: qty 1

## 2018-09-25 MED ORDER — SODIUM CHLORIDE 0.9 % IV BOLUS
1000.0000 mL | Freq: Once | INTRAVENOUS | Status: AC
  Administered 2018-09-25: 1000 mL via INTRAVENOUS

## 2018-09-25 MED ORDER — SODIUM CHLORIDE 0.9 % IV BOLUS: 1000.0000 mL | Freq: Once | INTRAVENOUS | Status: DC

## 2018-09-25 NOTE — ED Triage Notes (Signed)
To ED via [facility] from home after having a syncopal episode in bathroom- recently had surgery for a blood clot in left leg on Monday--, cool to touch, shivering on arrival, pt was ashen on EMS arrival to house, diaphoretic also.  IV  20g left hand - received 100cc NS enroute.

## 2018-09-25 NOTE — ED Notes (Signed)
Patient transported to X-ray 

## 2018-09-25 NOTE — ED Provider Notes (Signed)
[facility]GENC[facility] Provider Note   CSN: [id number] Arrival date & time: 09/25/18  1859     History   Chief Complaint Chief Complaint  Patient presents with  . Loss of Consciousness    HPI Sean PalmaClarence Blattner is a 48 y.o. male.  The history is provided by the patient and medical records. No language interpreter was used.  Near Syncope  This is a new problem. The current episode started less than 1 hour ago. The problem has been gradually improving. Pertinent negatives include no chest pain, no abdominal pain, no headaches and no shortness of breath. The symptoms are aggravated by standing. The symptoms are relieved by lying down and rest. He has tried nothing for the symptoms. The treatment provided no relief.    Past Medical History:  Diagnosis Date  . Hypertension     Patient Active Problem List   Diagnosis Date Noted  . Ischemia of extremity 09/21/2018  . PVD (peripheral vascular disease) (HCC) 09/20/2018    Past Surgical History:  Procedure Laterality Date  . EMBOLECTOMY Left 09/21/2018   Procedure: THROMBECTOMY LEFT POPLITEAL ARTERY BYPASS LEFT POPLITEAL TO TIBIAL ARTERY WITH SAPHENOUS VEIN HARVEST - FASCIOTOMY;  Surgeon: Chuck Hintickson, Aalliyah Kilker S, MD;  [facility];  Service: Vascular;  Laterality: Left;        Home Medications    Prior to Admission medications   Medication Sig Start Date End Date Taking? Authorizing Provider  hydrochlorothiazide (MICROZIDE) 12.5 MG capsule Take 1 capsule (12.5 mg total) by mouth 2 (two) times daily. 09/24/18 09/24/19  Larina EarthlyEarly, Todd F, MD  lisinopril (ZESTRIL) 2.5 MG tablet Take 1 tablet (2.5 mg total) by mouth daily. 09/24/18 09/24/19  Larina EarthlyEarly, Todd F, MD  oxyCODONE-acetaminophen (PERCOCET/ROXICET) 5-325 MG tablet Take 1 tablet by mouth every 4 (four) hours as needed for moderate pain. 09/23/18   Lars Mageollins, Emma M, PA-C    Family History No family history on file.  Social History Social History   Tobacco  Use  . Smoking status: Current Every Day Smoker    Packs/day: 1.00    Types: Cigarettes  . Smokeless tobacco: Never Used  Substance Use Topics  . Alcohol use: Never    Frequency: Never  . Drug use: Never     Allergies   Patient has no known allergies.   Review of Systems Review of Systems  Constitutional: Negative for chills, diaphoresis, fatigue and fever.  HENT: Negative for congestion and rhinorrhea.   Eyes: Negative for visual disturbance.  Respiratory: Negative for cough, chest tightness and shortness of breath.   Cardiovascular: Positive for near-syncope. Negative for chest pain and palpitations.  Gastrointestinal: Negative for abdominal pain, constipation, diarrhea, nausea and vomiting.  Genitourinary: Negative for flank pain and frequency.  Musculoskeletal: Negative for back pain, neck pain and neck stiffness.  Skin: Positive for wound. Negative for rash.  Neurological: Positive for light-headedness. Negative for dizziness, syncope, weakness, numbness and headaches.  Psychiatric/Behavioral: Negative for agitation.  All other systems reviewed and are negative.    Physical Exam Updated Vital Signs BP (!) 137/100   Pulse 98   Resp (!) 21   Ht 6' 5.5" (1.969 m)   Wt 116.8 kg   SpO2 95%   BMI 30.14 kg/m   Physical Exam Vitals signs and nursing note reviewed.  Constitutional:      General: He is not in acute distress.    Appearance: Normal appearance. He is well-developed. He is not ill-appearing, toxic-appearing or diaphoretic.  HENT:  Head: Normocephalic and atraumatic.     Nose: No congestion or rhinorrhea.     Mouth/Throat:     Pharynx: No oropharyngeal exudate or posterior oropharyngeal erythema.  Eyes:     Conjunctiva/sclera: Conjunctivae normal.     Pupils: Pupils are equal, round, and reactive to light.  Neck:     Musculoskeletal: Neck supple.  Cardiovascular:     Rate and Rhythm: Normal rate and regular rhythm.     Pulses: Normal pulses.      Heart sounds: No murmur.  Pulmonary:     Effort: Pulmonary effort is normal. No respiratory distress.     Breath sounds: Normal breath sounds. No wheezing, rhonchi or rales.  Chest:     Chest wall: No tenderness.  Abdominal:     General: There is no distension.     Palpations: Abdomen is soft.     Tenderness: There is no abdominal tenderness.  Musculoskeletal:        General: No tenderness.     Left lower leg: He exhibits laceration. He exhibits no tenderness, no bony tenderness, no swelling and no deformity. No edema.       Legs:  Skin:    General: Skin is warm and dry.     Capillary Refill: Capillary refill takes less than 2 seconds.     Coloration: Skin is not pale.     Findings: No erythema or rash.  Neurological:     General: No focal deficit present.     Mental Status: He is alert and oriented to person, place, and time.     Sensory: No sensory deficit.     Gait: Gait normal.  Psychiatric:        Mood and Affect: Mood normal.      ED Treatments / Results  Labs (all labs ordered are listed, but only abnormal results are displayed) Labs Reviewed  CBC WITH DIFFERENTIAL/PLATELET - Abnormal; Notable for the following components:      Result Value   WBC 14.4 (*)    Neutro Abs 9.5 (*)    Abs Immature Granulocytes 0.23 (*)    All other components within normal limits  COMPREHENSIVE METABOLIC PANEL - Abnormal; Notable for the following components:   Glucose, Bld 143 (*)    Creatinine, Ser 1.32 (*)    AST 69 (*)    ALT 65 (*)    All other components within normal limits  LACTIC ACID, PLASMA - Abnormal; Notable for the following components:   Lactic Acid, Venous 3.9 (*)    All other components within normal limits  URINALYSIS, ROUTINE W REFLEX MICROSCOPIC - Abnormal; Notable for the following components:   Hgb urine dipstick SMALL (*)    All other components within normal limits  CULTURE, BLOOD (ROUTINE X 2)  CULTURE, BLOOD (ROUTINE X 2)  URINE CULTURE  LACTIC ACID,  PLASMA  LIPASE, BLOOD  MAGNESIUM  I-STAT TROPONIN, ED  POC OCCULT BLOOD, ED  I-STAT TROPONIN, ED    EKG EKG Interpretation  Date/Time:  Sunday September 25 2018 19:07:17 EST Ventricular Rate:  84 PR Interval:    QRS Duration: 88 QT Interval:  422 QTC Calculation: 496 R Axis:   -15 Text Interpretation:  Sinus rhythm Probable left atrial enlargement Borderline left axis deviation Borderline T wave abnormalities ST elevation, consider inferior injury Borderline prolonged QT interval Baseline wander in lead(s) I III aVL aVF Significant artifact from patient shiver. No ptrior ECG for comparison.  No STEMI Confirmed by Theda Belfast ([id number])  on 09/25/2018 7:27:05 PM   Radiology Dg Chest 2 View  Result Date: 09/25/2018 CLINICAL DATA:  Surgery to the left leg on Tuesday. Now with shortness of breath, fever, and chills. EXAM: CHEST - 2 VIEW COMPARISON:  None. FINDINGS: Slightly shallow inspiration. Mild cardiac enlargement. No vascular congestion, edema, or consolidation. No blunting of costophrenic angles. No pneumothorax. Mediastinal contours appear intact. IMPRESSION: Mild cardiac enlargement. No evidence of active pulmonary disease. Electronically Signed   By: Burman NievesWilliam  Stevens M.D.   On: 09/25/2018 20:14    Procedures Procedures (including critical care time)  Medications Ordered in ED Medications  sodium chloride 0.9 % bolus 1,000 mL (0 mLs Intravenous Stopped 09/25/18 2033)  morphine 4 MG/ML injection 4 mg (4 mg Intravenous Given 09/25/18 2003)  sodium chloride 0.9 % bolus 1,000 mL (0 mLs Intravenous Stopped 09/25/18 2136)     Initial Impression / Assessment and Plan / ED Course  I have reviewed the triage vital signs and the nursing notes.  Pertinent labs & imaging results that were available during my care of the patient were reviewed by me and considered in my medical decision making (see chart for details).     Sean [name] is a 48 y.o. male with a past medical history  significant for hypertension and recent left lower extremity arterial thrombosis status post embolectomy and fasciotomy with wound VAC who presents with near syncopal episode.  Patient reports that he has been feeling well and was discharged yesterday from the hospital.  He reports that he went to urinate this evening and after peeing, got extremely lightheaded.  He reports that he sat down in a chair and family reports he did not fully pass out.  He reports that he was still very fatigued and lightheaded.  He denies chest pain, shortness of breath, palpitations, or cough.  He denies any urinary symptoms or diarrhea.  He reports that he has not had a bowel movement since he was in the hospital.  He denies significant abdominal pain or back pain.  He reports his left leg has been hurting since the surgery but it has not acutely changed.  He still has good sensation and strength in the foot.  Patient reports that he shook when he had a syncopal episode.  No history of seizures and patient does not think he had a seizure.  No loss of bowel or bladder control.  EMS reports that patient was borderline hypotensive with blood pressure in the 90s systolic on arrival.  Patient also was pale and cool to the touch and diaphoretic.  On arrival, patient's blood pressure was above 100 systolic.  Patient was shivering.  Rectal temperature was 100.0. Just under febrile.  Lungs clear chest nontender.  Abdomen nontender.  Patient had dopplerable DP and PT pulse on his surgical leg.  Patient had no focal neurologic deficits on initial exam.  Patient is shivering.  Clinically I am concerned patient may have infection given the shaking and your fever.  Patient will have laboratory testing and imaging to look for possible infectious source.  Given patient's recent surgery, will unwrap and take a look at his wound.  Patient was given fluids.   Anticipate reassessment after work-up.   Patient had initially elevated lactic acid.   After fluids this completely cleared.  Patient had no hypotension during his ED stay and blood pressure remained reassuring after fluids.  Urinalysis does not show infection.  Chest x-ray does not show pneumonia.  Patient's wound was unwrapped and  inspected with well appearance.  Clinically I am not concerned about infection in the surgical site at this time.  Laboratory testing did show mild leukocytosis.  Troponin negative x2.  Metabolic panel showed slight increase in creatinine.  Clinically suspect patient is slightly dehydrated leading to his nursing will episode after standing up after using the restroom.  Based on family report, do not feel he fully syncopized and was more likely near syncopal.  I suspect his near syncopal episode was vagal in nature after standing up after urinating.  Shared decision made conversation held with patient and family and they feel comfortable taking patient home given his overall reassuring work-up.  Do not feel patient has a bacterial infection at this time and as symptoms have resolved and patient had improved vitals, patient will be discharged for close PCP follow-up.  Patient also have close follow-up with his surgery team who will come inspect his wound this week.  Patient and family agreed with plan of care with discharge.  Patient discharged in good condition.   Final Clinical Impressions(s) / ED Diagnoses   Final diagnoses:  Lightheadedness  Near syncope  Dehydration    ED Discharge Orders    None      Clinical Impression: 1. Lightheadedness   2. Near syncope   3. Dehydration     Disposition: Discharge  Condition: Good  I have discussed the results, Dx and Tx plan with the pt(& family if present). He/she/they expressed understanding and agree(s) with the plan. Discharge instructions discussed at great length. Strict return precautions discussed and pt &/or family have verbalized understanding of the instructions. No further questions at  time of discharge.    Discharge Medication List as of 09/25/2018 11:29 PM      Follow Up: [facility] EMERGENCY DEPARTMENT [address] [id number] mc [place] [address] [phone]    Myrtie Hawk PCP and surgical teams        Mcdonald Reiling, Canary Brim, MD 09/26/18 [phone]

## 2018-09-25 NOTE — Discharge Instructions (Signed)
Your work-up today showed concern for dehydration which may have contributed to your near syncopal and lightheadedness episode.  Your temperature was just under a fever and our work-up did not show a convincing source of infection at this time.  With your leg pain that had not worsened, clear urine, and clear chest x-ray, we do not suspect bacterial infection.  Your wound was inspected and did not appear to show cellulitis at this time.  Your foot had good sensation, strength, and pulses.  We do not feel there is recurrent clot.  Please have your wound team and surgical team continue following this.  If any symptoms start looking like infection, is return to the nearest emergency department.  After fluids, you appeared much more comfortable and your lactic acid cleared.    During our shared decision-making conversation, we decided that you were stable for discharge home.  Please follow-up as we discussed and stay hydrated.  If any symptoms change or worsen acutely, please return to the nearest emergency department.

## 2018-09-25 NOTE — ED Notes (Signed)
Reviewed d/c instructions with pt, who verbalized understanding and had no outstanding questions. Pt departed in NAD.   

## 2018-09-27 LAB — URINE CULTURE: Culture: <10000 — AB

## 2018-09-27 NOTE — Discharge Summary (Signed)
[facility] Discharge Summary   Patient ID:  Sean [name] MRN: [id number] DOB/AGE: 50/10/1970 50 y.o.  Admit date: 09/20/2018 Discharge date: 09/24/2018 Date of Surgery: 09/21/2018 Surgeon: Surgeon(s): Maeola Harmanain, Brandon Christopher, MD Chuck Hintickson, Christopher S, MD  Admission Diagnosis: Peripheral artery disease [facility](HCC) [I73.9]  Discharge Diagnoses:  Peripheral artery disease [facility](HCC) [I73.9]  Secondary Diagnoses: Past Medical History:  Diagnosis Date  . Hypertension     Procedure(s): THROMBECTOMY LEFT POPLITEAL ARTERY BYPASS LEFT POPLITEAL TO TIBIAL ARTERY WITH SAPHENOUS VEIN HARVEST - FASCIOTOMY  Discharged Condition: stable  HPI: INFRAINGUINAL ARTERIAL OCCLUSIVE DISEASE: This patient describes a several month history of left calf claudication.  3 days ago, he noted the gradual onset of left calf pain which became more severe today.  The history is somewhat unusual in that he does not complain of  pain in his foot.  However, based on his exam I think he does have evidence of significant infrainguinal arterial occlusive disease.      The patient CT scan shows occlusion of the popliteal artery. He was scheduled for surgery exploration of the popliteal artery and possible bypass.   Hospital Course:  Sean [name] is a 48 y.o. male is S/P Procedure(s): THROMBECTOMY LEFT POPLITEAL ARTERY BYPASS LEFT POPLITEAL TO TIBIAL ARTERY WITH SAPHENOUS VEIN HARVEST - FASCIOTOMY 4 compartment fasciotomies with most of them closed except the medial aspect where the muscle remains bulging.  He was discharged home with wound vac to be changed 3 times a week.  HH PT/RN ordered.  Palpable posterior tibial pulse on the left.   Consults:  Treatment Team:  Maeola Harmanain, Brandon Christopher, MD  Significant Diagnostic Studies: CBC Lab Results  Component Value Date   WBC 14.4 (H) 09/25/2018   HGB 14.6 09/25/2018   HCT 46.3 09/25/2018   MCV 82.8 09/25/2018   PLT 304 09/25/2018    BMET     Component Value Date/Time   NA 137 09/25/2018 1919   K 4.4 09/25/2018 1919   CL 102 09/25/2018 1919   CO2 23 09/25/2018 1919   GLUCOSE 143 (H) 09/25/2018 1919   BUN 15 09/25/2018 1919   CREATININE 1.32 (H) 09/25/2018 1919   CALCIUM 9.5 09/25/2018 1919   GFRNONAA >60 09/25/2018 1919   GFRAA >60 09/25/2018 1919   COAG No results found for: INR, PROTIME   Disposition:  Discharge to :Home Discharge Instructions    Call MD for:  redness, tenderness, or signs of infection (pain, swelling, bleeding, redness, odor or green/yellow discharge around incision site)   Complete by:  As directed    Call MD for:  severe or increased pain, loss or decreased feeling  in affected limb(s)   Complete by:  As directed    Call MD for:  temperature >100.5   Complete by:  As directed    Resume previous diet   Complete by:  As directed      Allergies as of 09/24/2018   No Known Allergies     Medication List    TAKE these medications   hydrochlorothiazide 12.5 MG capsule Commonly known as:  MICROZIDE Take 1 capsule (12.5 mg total) by mouth 2 (two) times daily.   lisinopril 2.5 MG tablet Commonly known as:  ZESTRIL Take 1 tablet (2.5 mg total) by mouth daily.   oxyCODONE-acetaminophen 5-325 MG tablet Commonly known as:  PERCOCET/ROXICET Take 1 tablet by mouth every 4 (four) hours as needed for moderate pain.      Verbal and written Discharge instructions given to the  patient. Wound care per Discharge AVS Follow-up Information    Maeola Harman, MD In 3 weeks.   Specialties:  Vascular Surgery, Cardiology Why:  Office will call you to arrange your appt (sent) Contact information: [address] [place] [address] [phone]        [facility] - Dme Follow up.   Why:  home wound VAC Contact information: [address] [place] [address] [phone]        [facility]-Home Follow up.   Specialty:  Home Health Services Why:   HHRN/PT arranged- they will call you to set up home visits Contact information: [address] [place] [address] [phone]           Signed: Mosetta Pigeon 09/27/2018, 5:49 PM - For VQI Registry use --- Instructions: Press F2 to tab through selections.  Delete question if not applicable.   Post-op:  Wound infection: No  Graft infection: No  Transfusion: No  If yes, 0 units given New Arrhythmia: No Ipsilateral amputation: [x ] no, [ ]  Minor, [ ]  BKA, [ ]  AKA Discharge patency: [x ] Primary, [ ]  Primary assisted, [ ]  Secondary, [ ]  Occluded Patency judged by: [ ]  Dopper only, [ ]  Palpable graft pulse, [ x] Palpable distal pulse, [ ]  ABI inc. > 0.15, [ ]  Duplex  D/C Ambulatory Status: Ambulatory with Assistance  Complications: MI: [x ] No, [ ]  Troponin only, [ ]  EKG or Clinical CHF: No Resp failure: [x ] none, [ ]  Pneumonia, [ ]  Ventilator Chg in renal function: [x ] none, [ ]  Inc. Cr > 0.5, [ ]  Temp. Dialysis, [ ]  Permanent dialysis Stroke: [x ] None, [ ]  Minor, [ ]  Major Return to OR: No  Reason for return to OR: [ ]  Bleeding, [ ]  Infection, [ ]  Thrombosis, [ ]  Revision  Discharge medications: Statin use:  No  for medical reason   ASA use:  Yes Plavix use:  No  for medical reason   Beta blocker use: No  for medical reason   Coumadin use: No  for medical reason

## 2018-09-30 ENCOUNTER — Ambulatory Visit (INDEPENDENT_AMBULATORY_CARE_PROVIDER_SITE_OTHER): Payer: BLUE CROSS/BLUE SHIELD | Admitting: Vascular Surgery

## 2018-09-30 ENCOUNTER — Other Ambulatory Visit: Payer: Self-pay

## 2018-09-30 ENCOUNTER — Encounter: Payer: Self-pay | Admitting: Vascular Surgery

## 2018-09-30 VITALS — BP 124/82 | HR 97 | Temp 97.0°F | Resp 20 | Ht 77.0 in | Wt 257.0 lb

## 2018-09-30 DIAGNOSIS — Z9889 Other specified postprocedural states: Secondary | ICD-10-CM

## 2018-09-30 DIAGNOSIS — I739 Peripheral vascular disease, unspecified: Secondary | ICD-10-CM

## 2018-09-30 LAB — CULTURE, BLOOD (ROUTINE X 2)
Culture: NO GROWTH
Culture: NO GROWTH

## 2018-09-30 MED ORDER — OXYCODONE-ACETAMINOPHEN 10-325 MG PO TABS: 1.0000 | ORAL_TABLET | ORAL | 0 refills | Status: DC | PRN

## 2018-09-30 NOTE — Progress Notes (Signed)
    Subjective:     [name], male   DOB: [birth year], 48 y.o.   MRN: [id number]  HPI 48 year old male follows up after left lower extremity revascularization 4 compartment fasciotomy.  He has wound vacs to his medial wound.  He is having significant pain.  States that he can move his ankle but is limited by swelling and pain.   Review of Systems Left lower extremity pain    Objective:   Physical Exam Awake alert oriented Palpable left posterior tibial pulse Medial fasciotomy site is beefy red.  Staples were removed and it was under some tension.  He has minimal range of motion secondary to 2+ edema in the ankle All compartments of left lower extremity are soft with staples in place laterally    Assessment:     48 year old male status post emergent left lower extremity bypass with 4 compartment fasciotomies for acute limb ischemia.  He is now having significant pain at that wound VAC site.  He does have good bleeding at the site is taking his aspirin as prescribed.  Some staples were removed there was some tension but his compartments are all soft.    Plan:     I have sent pain medicine to his pharmacy Recommended if pain continues to present to the emergency department but patient currently not interested in further hospitalization Wound VAC will be replaced by home health nurse today Follow-up in 1 week for wound check.  Ilhan Debenedetto C. Randie Heinz, MD [facility] [place] Office: [phone] Pager: [phone]

## 2018-10-07 ENCOUNTER — Ambulatory Visit (INDEPENDENT_AMBULATORY_CARE_PROVIDER_SITE_OTHER): Payer: Self-pay | Admitting: Vascular Surgery

## 2018-10-07 ENCOUNTER — Encounter: Payer: Self-pay | Admitting: Vascular Surgery

## 2018-10-07 VITALS — BP 140/90 | HR 102 | Temp 98.4°F | Resp 18 | Ht 77.0 in | Wt 257.0 lb

## 2018-10-07 DIAGNOSIS — I739 Peripheral vascular disease, unspecified: Secondary | ICD-10-CM

## 2018-10-07 MED ORDER — OXYCODONE-ACETAMINOPHEN 10-325 MG PO TABS: 1.0000 | ORAL_TABLET | ORAL | 0 refills | Status: DC | PRN

## 2018-10-07 NOTE — Progress Notes (Signed)
    Subjective:     [name], male   DOB: [birth year], 48 y.o.   MRN: [id number]  HPI 48 year old male follows up 1 week wound check.  Last visit he was having significant pain and swelling of the left lower extremity.  He continues wound VAC.  Pain is somewhat improving.   Review of Systems Left leg pain    Objective:   Physical Exam  Vitals:   10/07/18 0824  BP: 140/90  Pulse: (!) 102  Resp: 18  Temp: 98.4 F (36.9 C)  SpO2: 100%    Awake alert oriented Incisions above the knee on the left are healing well Lateral leg incision healing well with staples Medial incision with beefy red granulation. There is a palpable left posterior tibial pulse at the ankle Compartments are soft but with nonpitting edema    Assessment:     48 year old male status post left lower extremity revascularization for acute on chronic disease.  He is having significant pain in his leg with swelling and there is a wound VAC to help with healing of the medial fasciotomy site.    Plan:     I have sent a final prescription to his pharmacy for Percocet 10 mg given his pain and need for ongoing wound VAC care. He will follow-up in 2 weeks to have staples removed and for wound check.  Joanne Salah C. Randie Heinz, MD [facility] [place] Office: [phone] Pager: [phone]

## 2018-10-14 ENCOUNTER — Encounter: Payer: BLUE CROSS/BLUE SHIELD | Admitting: Vascular Surgery

## 2018-10-18 ENCOUNTER — Telehealth: Payer: Self-pay | Admitting: *Deleted

## 2018-10-18 NOTE — Telephone Encounter (Signed)
Patient's wife called regarding more pain medication at 8:50am today. According to Dr. Darcella Cheshire note on 10-07-2018 he rx'd Percocet 10-325mg  # 30 and said that it would be the last Rx for this patient, he was to only use for VAC changes. I have not received any phone calls from the [facility] nurse regarding this patients pain management during these changes. I looked him up on the [facility] and he has gotten #90 Percocet 10-325mg  since 09-23-2018.   I called the patient back but had to leave a message that we would not Rx any more meds without him being seen. Presently, his appt is on Friday 10-21-2018 with Dr. Randie Heinz.

## 2018-10-21 ENCOUNTER — Other Ambulatory Visit: Payer: Self-pay

## 2018-10-21 ENCOUNTER — Encounter: Payer: Self-pay | Admitting: Vascular Surgery

## 2018-10-21 ENCOUNTER — Ambulatory Visit: Payer: BLUE CROSS/BLUE SHIELD | Admitting: Vascular Surgery

## 2018-10-21 VITALS — BP 151/100 | HR 99 | Temp 96.8°F | Resp 18 | Ht 77.0 in | Wt 257.0 lb

## 2018-10-21 DIAGNOSIS — Z9889 Other specified postprocedural states: Secondary | ICD-10-CM

## 2018-10-21 DIAGNOSIS — I739 Peripheral vascular disease, unspecified: Secondary | ICD-10-CM

## 2018-10-21 NOTE — Progress Notes (Signed)
    Subjective:     [name], male   DOB: 01-08-1971, 48 y.o.   MRN: [id number]  HPI 48 year old male follows up after left lower extremity revascularization with bypass.  He still has a wound VAC.  He has pain denies any fevers.  He can feel his foot well without issue.   Review of Systems Pain left leg    Objective:   Physical Exam Vitals:   10/21/18 0840  BP: (!) 151/100  Pulse: 99  Resp: 18  Temp: (!) 96.8 F (36 C)  SpO2: 100%   Awake alert oriented Incisions in left upper leg are healing well with Dermabond in place Palpable posterior tibial pulse on the left Beefy red granulation of incision medially.  All staples removed medially and laterally today.     Assessment:        48 year old male status post left lower extremity revascularization for acute on chronic disease.  He is having significant pain in his leg with swelling and there is a wound VAC to help with healing of the medial fasciotomy site.    Plan:     Will follow-up in 1 month for wound check.   Continue wound VAC. We consider this to be acute on chronic disease not primarily thromboembolic and so he can continue on only aspirin lifelong.     Brandon C. Randie Heinz, MD [facility] [place] Office: [phone] Pager: [phone]

## 2018-11-11 ENCOUNTER — Telehealth: Payer: Self-pay | Admitting: Vascular Surgery

## 2018-11-15 ENCOUNTER — Other Ambulatory Visit: Payer: Self-pay

## 2018-11-15 ENCOUNTER — Ambulatory Visit (INDEPENDENT_AMBULATORY_CARE_PROVIDER_SITE_OTHER): Payer: Self-pay | Admitting: Family

## 2018-11-15 ENCOUNTER — Encounter: Payer: Self-pay | Admitting: Family

## 2018-11-15 VITALS — BP 143/100 | HR 104 | Temp 97.2°F | Resp 20 | Ht 77.5 in | Wt 286.0 lb

## 2018-11-15 DIAGNOSIS — I779 Disorder of arteries and arterioles, unspecified: Secondary | ICD-10-CM

## 2018-11-15 DIAGNOSIS — F17200 Nicotine dependence, unspecified, uncomplicated: Secondary | ICD-10-CM

## 2018-11-15 DIAGNOSIS — Z95828 Presence of other vascular implants and grafts: Secondary | ICD-10-CM

## 2018-11-15 DIAGNOSIS — Z9889 Other specified postprocedural states: Secondary | ICD-10-CM

## 2018-11-15 NOTE — Patient Instructions (Signed)
Steps to Quit Smoking  Smoking tobacco can be bad for your health. It can also affect almost every organ in your body. Smoking puts you and people around you at risk for many serious long-lasting (chronic) diseases. Quitting smoking is hard, but it is one of the best things that you can do for your health. It is never too late to quit. What are the benefits of quitting smoking? When you quit smoking, you lower your risk for getting serious diseases and conditions. They can include:  Lung cancer or lung disease.  Heart disease.  Stroke.  Heart attack.  Not being able to have children (infertility).  Weak bones (osteoporosis) and broken bones (fractures). If you have coughing, wheezing, and shortness of breath, those symptoms may get better when you quit. You may also get sick less often. If you are pregnant, quitting smoking can help to lower your chances of having a baby of low birth weight. What can I do to help me quit smoking? Talk with your doctor about what can help you quit smoking. Some things you can do (strategies) include:  Quitting smoking totally, instead of slowly cutting back how much you smoke over a period of time.  Going to in-person counseling. You are more likely to quit if you go to many counseling sessions.  Using resources and support systems, such as: ? Online chats with a counselor. ? Phone quitlines. ? Printed self-help materials. ? Support groups or group counseling. ? Text messaging programs. ? Mobile phone apps or applications.  Taking medicines. Some of these medicines may have nicotine in them. If you are pregnant or breastfeeding, do not take any medicines to quit smoking unless your doctor says it is okay. Talk with your doctor about counseling or other things that can help you. Talk with your doctor about using more than one strategy at the same time, such as taking medicines while you are also going to in-person counseling. This can help make  quitting easier. What things can I do to make it easier to quit? Quitting smoking might feel very hard at first, but there is a lot that you can do to make it easier. Take these steps:  Talk to your family and friends. Ask them to support and encourage you.  Call phone quitlines, reach out to support groups, or work with a counselor.  Ask people who smoke to not smoke around you.  Avoid places that make you want (trigger) to smoke, such as: ? Bars. ? Parties. ? Smoke-break areas at work.  Spend time with people who do not smoke.  Lower the stress in your life. Stress can make you want to smoke. Try these things to help your stress: ? Getting regular exercise. ? Deep-breathing exercises. ? Yoga. ? Meditating. ? Doing a body scan. To do this, close your eyes, focus on one area of your body at a time from head to toe, and notice which parts of your body are tense. Try to relax the muscles in those areas.  Download or buy apps on your mobile phone or tablet that can help you stick to your quit plan. There are many free apps, such as QuitGuide from the CDC (Centers for Disease Control and Prevention). You can find more support from smokefree.gov and other websites. This information is not intended to replace advice given to you by your health care provider. Make sure you discuss any questions you have with your health care provider. Document Released: 06/06/2009 Document Revised: 04/07/2016   Document Reviewed: 12/25/2014 Elsevier Interactive Patient Education  2019 Elsevier Inc.     Peripheral Vascular Disease  Peripheral vascular disease (PVD) is a disease of the blood vessels that are not part of your heart and brain. A simple term for PVD is poor circulation. In most cases, PVD narrows the blood vessels that carry blood from your heart to the rest of your body. This can reduce the supply of blood to your arms, legs, and internal organs, like your stomach or kidneys. However, PVD most  often affects a person's lower legs and feet. Without treatment, PVD tends to get worse. PVD can also lead to acute ischemic limb. This is when an arm or leg suddenly cannot get enough blood. This is a medical emergency. Follow these instructions at home: Lifestyle  Do not use any products that contain nicotine or tobacco, such as cigarettes and e-cigarettes. If you need help quitting, ask your doctor.  Lose weight if you are overweight. Or, stay at a healthy weight as told by your doctor.  Eat a diet that is low in fat and cholesterol. If you need help, ask your doctor.  Exercise regularly. Ask your doctor for activities that are right for you. General instructions  Take over-the-counter and prescription medicines only as told by your doctor.  Take good care of your feet: ? Wear comfortable shoes that fit well. ? Check your feet often for any cuts or sores.  Keep all follow-up visits as told by your doctor This is important. Contact a doctor if:  You have cramps in your legs when you walk.  You have leg pain when you are at rest.  You have coldness in a leg or foot.  Your skin changes.  You are unable to get or have an erection (erectile dysfunction).  You have cuts or sores on your feet that do not heal. Get help right away if:  Your arm or leg turns cold, numb, and blue.  Your arms or legs become red, warm, swollen, painful, or numb.  You have chest pain.  You have trouble breathing.  You suddenly have weakness in your face, arm, or leg.  You become very confused or you cannot speak.  You suddenly have a very bad headache.  You suddenly cannot see. Summary  Peripheral vascular disease (PVD) is a disease of the blood vessels.  A simple term for PVD is poor circulation. Without treatment, PVD tends to get worse.  Treatment may include exercise, low fat and low cholesterol diet, and quitting smoking. This information is not intended to replace advice given to  you by your health care provider. Make sure you discuss any questions you have with your health care provider. Document Released: 11/04/2009 Document Revised: 09/17/2016 Document Reviewed: 09/17/2016 Elsevier Interactive Patient Education  2019 Elsevier Inc.  

## 2018-11-15 NOTE — Progress Notes (Signed)
[facility] [place]   CC: Follow up peripheral artery occlusive disease  History of Present Illness Sean [name] is a 48 y.o. male who is status post left lower extremity revascularization (harvest of left greater saphenous vein, left above-knee popliteal to posterior tibial artery bypass with non-reversed ipsilateral greater saphenous vein, left lower extremity 4 compartment fasciotomies, and application of negative pressure dressing) on 10-22-18 by Dr. Randie Heinz and Dr. Edilia Bo for acute on chronic disease.   Dr. Randie Heinz last evaluated pt on 10-21-18. At that time pt was having significant pain in his leg with swelling and there was a wound VAC to help with healing of the medial fasciotomy site.  Dr. Randie Heinz advised follow-up in 1 month for wound check and continue wound VAC. We consider this to be acute on chronic disease not primarily thromboembolic and Dr. Randie Heinz advised that pt continue on only aspirin lifelong.  HH visits pt 3x/week; pt states he will call them today and they will come out today and reapply the wound vac dressing. Pt denies fever or chills, he states he is no longer having any pain in his left leg  His blood pressure is somewhat elevated now, pt states he has not yet taken his blood pressure medication today. He states his blood pressure is about 130's/80's when checked by [facility].   His PCP is [employer] PA-C with [facility].  Pt denies any known history of stroke or MI.    Diabetic: No Tobacco use: smoker  (2/3 ppd)  Pt meds include: Statin :No Betablocker: No ASA: Yes Other anticoagulants/antiplatelets: no  Past Medical History:  Diagnosis Date  . Hypertension     Social History Social History   Tobacco Use  . Smoking status: Current Every Day Smoker    Packs/day: 1.00    Types: Cigarettes  . Smokeless tobacco: Never Used  Substance Use Topics  . Alcohol use: Never    Frequency: Never  . Drug use: Never    Family History No family  history on file.  Past Surgical History:  Procedure Laterality Date  . EMBOLECTOMY Left 09/21/2018   Procedure: THROMBECTOMY LEFT POPLITEAL ARTERY BYPASS LEFT POPLITEAL TO TIBIAL ARTERY WITH SAPHENOUS VEIN HARVEST - FASCIOTOMY;  Surgeon: Chuck Hint, MD;  Location: [facility] OR;  Service: Vascular;  Laterality: Left;    No Known Allergies  Current Outpatient Medications  Medication Sig Dispense Refill  . aspirin EC 81 MG tablet Take 81 mg by mouth daily.    Marland Kitchen docusate sodium (COLACE) 100 MG capsule Take 100 mg by mouth 2 (two) times daily as needed (constipation).    Marland Kitchen oxyCODONE-acetaminophen (PERCOCET) 10-325 MG tablet Take 1 tablet by mouth every 4 (four) hours as needed for pain. 30 tablet 0   No current facility-administered medications for this visit.     ROS: See HPI for pertinent positives and negatives.   Physical Examination  Vitals:   11/15/18 0940  BP: (!) 143/100  Pulse: (!) 104  Resp: 20  Temp: (!) 97.2 F (36.2 C)  TempSrc: Oral  SpO2: 100%  Weight: 286 lb (129.7 kg)  Height: 6' 5.5" (1.969 m)   Body mass index is 33.48 kg/m.  General: A&O x 3, WDWN, obese but fit apearing male. Gait: slight limp HENT: No gross abnormalities.  Eyes: PERRLA. Pulmonary: Respirations are non labored, limited air movement in all fields, no rales, rhonchi, or wheezes. Cardiac: regular rhythm, no detected murmur.         Carotid  Bruits Right Left   Negative Negative   Adominal aortic pulse is not palpable                         VASCULAR EXAM: Extremities without ischemic changes, without Gangrene; with open wound, see photo below. Left calf lateral fasciotomy site has completely healed. Left groin and left medial thigh incisions have completely healed.    Left calf medial fasciotomy site, healing well, beefy red tissue at base with moderate amount of bleeding, edges of wound contacting, no signs of infection. 2+ palpable left PT pulse                                                                                                            LE Pulses Right Left       FEMORAL  2+ palpable  2+ palpable        POPLITEAL  not palpable   not palpable       POSTERIOR TIBIAL  not palpable   2+ palpable        DORSALIS PEDIS      ANTERIOR TIBIAL not palpable  not palpable    Abdomen: soft, NT, no palpable masses. Skin: no rashes, no cellulitis, no ulcers noted. See Extremities and photo. Musculoskeletal: no muscle wasting or atrophy.  Neurologic: A&O X 3; appropriate affect, Sensation is normal; MOTOR FUNCTION:  moving all extremities equally, motor strength 5/5 throughout. Speech is fluent/normal. CN 2-12 intact. Psychiatric: Thought content is normal, mood appropriate for clinical situation.     ASSESSMENT: Sean [name] is a 48 y.o. male who is status post left lower extremity revascularization (harvest of left greater saphenous vein, left above-knee popliteal to posterior tibial artery bypass with non-reversed ipsilateral greater saphenous vein, left lower extremity 4 compartment fasciotomies, and application of negative pressure dressing) on 10-22-18 by Dr. Randie Heinz and Dr. Edilia Bo.  He is no longer having pain in his left calf fasciotomy site which is shallow, granulating nicely, edges of fasciotomy are contracting, no signs or symptoms of infection.  His left PT pulse is 2+ palpable.  There are no signs of ischemia in both of his lower extremities.   Consider statin therapy which is associated with a greater reduction in CVD risk and improved endothelial function, will defer to pt PCP.   PLAN:  Based on the patient's HPI and examination, pt will return to clinic in 3 weeks for left calf fasciotomy wound check.  Continue wound vac dressing.  I advised pt and his wife to notify us if he develops fever or chills, or concerns about his fasciotomy site, or other concerns re the circulation in his feet or legs.   Over 3 minutes was spent counseling  patient re smoking cessation, and patient was given several free resources re smoking cessation.  I discussed in depth with the patient the nature of atherosclerosis, and emphasized the importance of maximal medical management including strict control of blood pressure, blood glucose, and lipid levels, obtaining regular exercise, and cessation of  smoking.  The patient is aware that without maximal medical management the underlying atherosclerotic disease process will progress, limiting the benefit of any interventions.  The patient was given information about PAD including signs, symptoms, treatment, what symptoms should prompt the patient to seek immediate medical care, and risk reduction measures to take.  Charisse March, RN, MSN, FNP-C [facility] [place] Phone: [phone]  Clinic MD: Bonnye Fava  11/15/18 9:58 AM

## 2018-11-28 ENCOUNTER — Encounter: Payer: Self-pay | Admitting: Family

## 2018-11-28 ENCOUNTER — Telehealth: Payer: Self-pay | Admitting: Family

## 2018-11-28 ENCOUNTER — Other Ambulatory Visit: Payer: Self-pay

## 2018-11-28 ENCOUNTER — Ambulatory Visit (INDEPENDENT_AMBULATORY_CARE_PROVIDER_SITE_OTHER): Payer: Self-pay | Admitting: Family

## 2018-11-28 VITALS — BP 131/97 | HR 110 | Temp 97.8°F | Resp 18 | Ht 77.5 in | Wt 289.0 lb

## 2018-11-28 DIAGNOSIS — Z95828 Presence of other vascular implants and grafts: Secondary | ICD-10-CM

## 2018-11-28 DIAGNOSIS — Z9889 Other specified postprocedural states: Secondary | ICD-10-CM

## 2018-11-28 DIAGNOSIS — F17200 Nicotine dependence, unspecified, uncomplicated: Secondary | ICD-10-CM

## 2018-11-28 DIAGNOSIS — I779 Disorder of arteries and arterioles, unspecified: Secondary | ICD-10-CM

## 2018-11-28 DIAGNOSIS — S7012XA Contusion of left thigh, initial encounter: Secondary | ICD-10-CM

## 2018-11-28 MED ORDER — CEPHALEXIN 500 MG PO CAPS: 500.0000 mg | ORAL_CAPSULE | Freq: Four times a day (QID) | ORAL | 0 refills | Status: DC

## 2018-11-28 NOTE — Progress Notes (Signed)
[facility] [place]   CC: left medial thigh incision pain, swelling, and warmth s/p left LE revascularization, hx PAD   History of Present Illness Sean [name] is a 48 y.o. male who is status post left lower extremity revascularization (harvest of left greater saphenous vein, left above-knee popliteal to posterior tibial artery bypass with non-reversed ipsilateral greater saphenous vein, left lower extremity 4 compartment fasciotomies, and application of negative pressure dressing) on 10-22-18 by Dr. Randie Heinz and Dr. Edilia Bo for acute on chronic disease.   Dr. Randie Heinz last evaluated pt on 10-21-18. At that time pt was having significant pain in his leg with swelling and there was a wound VAC to help with healing of the medial fasciotomy site.  Dr. Randie Heinz advised follow-up in 1 month for wound check and continue wound VAC. We consider this to be acute on chronic disease not primarily thromboembolic and Dr. Randie Heinz advised that pt continue on only aspirin lifelong.  He returns today with c/o throbbing pain, warm to touch at medial thigh incision, that started a couple of weeks ago, states is worsening, he denies fever or chills, denies drainage from the the thigh incision. He states that the left calf fasciotomy site is improving with wound vac.   HH visits pt 3x/week to reapply the wound vac dressing.  His PCP is [employer] PA-C with [facility].  Pt denies any known history of stroke or MI.    Diabetic: No Tobacco use: smoker  (2/3 ppd), is currently taking Chantix  Pt meds include: Statin :No Betablocker: No ASA: Yes Other anticoagulants/antiplatelets: no    Past Medical History:  Diagnosis Date  . Hypertension     Social History Social History   Tobacco Use  . Smoking status: Current Every Day Smoker    Packs/day: 1.00    Types: Cigarettes  . Smokeless tobacco: Never Used  Substance Use Topics  . Alcohol use: Never    Frequency: Never  . Drug  use: Never    Family History Family History  Problem Relation Age of Onset  . Diabetes Father     Past Surgical History:  Procedure Laterality Date  . EMBOLECTOMY Left 09/21/2018   Procedure: THROMBECTOMY LEFT POPLITEAL ARTERY BYPASS LEFT POPLITEAL TO TIBIAL ARTERY WITH SAPHENOUS VEIN HARVEST - FASCIOTOMY;  Surgeon: Chuck Hint, MD;  Location: [facility] OR;  Service: Vascular;  Laterality: Left;    No Known Allergies  Current Outpatient Medications  Medication Sig Dispense Refill  . amLODipine (NORVASC) 10 MG tablet Take 10 mg by mouth daily.    Marland Kitchen aspirin EC 81 MG tablet Take 81 mg by mouth daily.    Marland Kitchen lisinopril-hydrochlorothiazide (PRINZIDE,ZESTORETIC) 20-25 MG tablet Take by mouth.    . docusate sodium (COLACE) 100 MG capsule Take 100 mg by mouth 2 (two) times daily as needed (constipation).    Marland Kitchen oxyCODONE-acetaminophen (PERCOCET) 10-325 MG tablet Take 1 tablet by mouth every 4 (four) hours as needed for pain. (Patient not taking: Reported on 11/28/2018) 30 tablet 0   No current facility-administered medications for this visit.     ROS: See HPI for pertinent positives and negatives.   Physical Examination  Vitals:   11/28/18 1319  BP: (!) 131/97  Pulse: (!) 110  Resp: 18  Temp: 97.8 F (36.6 C)  TempSrc: Oral  SpO2: 100%  Weight: 289 lb (131.1 kg)  Height: 6' 5.5" (1.969 m)   Body mass index is 33.83 kg/m.  General: A&O x 3, WDWN, obese but  fit appearing male. Gait: slight limp, wound vac on left calf HENT: No gross abnormalities.  Eyes: PERRLA. Pulmonary: Respirations are non labored, fair air movement in all fields, no rakes, rhonchi, or wheezes. Cardiac: regular rhythm, no detected murmur.         Carotid Bruits Right Left   Negative Negative   Radial pulses are palpable bilaterally   Adominal aortic pulse is not palpable                         VASCULAR EXAM: Extremities without ischemic changes, without Gangrene; with open wound at left medial  calf fasciotomy site, wound vac dressing in place.   Both medial thigh incisions are well healed. The more anterior of the two incisions has what feels like a hematoma below and around the incision, and the incision has mild erythema, no drainage, see photo below.   left thigh Left thigh, medial aspect                                                                                                           LE Pulses Right Left       FEMORAL  palpable   palpable        POPLITEAL  not palpable   not palpable       POSTERIOR TIBIAL  not palpable    palpable        DORSALIS PEDIS      ANTERIOR TIBIAL  palpable   palpable    Abdomen: soft, NT, no palpable masses. Skin: no rashes, no cellulitis, no ulcers noted. See Extremities and photo.  Musculoskeletal: no muscle wasting or atrophy.  Neurologic: A&O X 3; appropriate affect, Sensation is normal; MOTOR FUNCTION:  moving all extremities equally, motor strength 5/5 throughout. Speech is fluent/normal. CN 2-12 intact. Psychiatric: Thought content is normal, mood appropriate for clinical situation.    ASSESSMENT: Sean [name] is a 48 y.o. male who is status post left lower extremity revascularization (harvest of left greater saphenous vein, left above-knee popliteal to posterior tibial artery bypass with non-reversed ipsilateral greater saphenous vein, left lower extremity 4 compartment fasciotomies, and application of negative pressure dressing) on 10-22-18 by Dr. Randie Heinzain and Dr. Edilia Boickson.  He has pain at the more anterior of the two medial thigh incisions, has what seems to be a hematoma under that incision, which is the likely source of the pain; this will take time to resolve.  There is some warmth to touch at that incision and mild erythema, no fever or chills, see Plan.  There are no signs of ischemia in both of his lower extremities, bilateral pedal pulses are palpable.  Consider statin therapy which is associated with a greater  reduction in CVD risk and improved endothelial function, will defer to PCP.    PLAN:  Based on the patient's vascular studies and examination, pt will return to clinic in 2 weeks with left calf fasciotomy wound check.  Continue wound vac to left calf improving fasciotomy site.   Left medial  thigh incision is painful, likely from a hematoma underneath incision, but there is also mild erythema at the incision with warmth to touch, no drainage. As prophylaxis to infection at incision, will prescribe Keflex 500 mg po qid X 10 days, disp #40, 0 refills.   Current smoking: he is taking Chantix, trying to quit.     Charisse March, RN, MSN, FNP-C [facility] Phone: [phone]  Clinic MD: Darrick Penna on call  11/28/18 1:30 PM

## 2018-11-28 NOTE — Telephone Encounter (Signed)
[name] from [facility] called.  Pt is having swelling and pain on upper left knee.  He has been elevating it.  It is warm to the touch.  I tried calling back to have patient come in for an appt today.  Left voice message.  Ernst Spell., LPN

## 2018-12-01 ENCOUNTER — Ambulatory Visit: Payer: BLUE CROSS/BLUE SHIELD | Admitting: Family

## 2018-12-06 ENCOUNTER — Ambulatory Visit: Payer: BLUE CROSS/BLUE SHIELD | Admitting: Family

## 2018-12-06 ENCOUNTER — Telehealth: Payer: Self-pay | Admitting: *Deleted

## 2018-12-06 NOTE — Telephone Encounter (Signed)
Returned call to [employer], nurse with [facility].  She reported the patient's wound has not worsened, however is not showing any improvement.  Patient denies fever, Sean [name] states the wound is "pink" but no drainage noted.  The swelling remains present above his left knee. Sean [name] is seeing patient again tomorrow, Wednesday, 12/07/2018 and will call with a report of noted changes or worsening.

## 2018-12-08 ENCOUNTER — Ambulatory Visit (INDEPENDENT_AMBULATORY_CARE_PROVIDER_SITE_OTHER): Payer: Self-pay | Admitting: Family

## 2018-12-08 ENCOUNTER — Telehealth: Payer: Self-pay | Admitting: Vascular Surgery

## 2018-12-08 ENCOUNTER — Other Ambulatory Visit: Payer: Self-pay

## 2018-12-08 ENCOUNTER — Encounter: Payer: Self-pay | Admitting: Family

## 2018-12-08 VITALS — BP 130/85 | HR 114 | Temp 97.6°F | Resp 18 | Ht 77.5 in | Wt 289.0 lb

## 2018-12-08 DIAGNOSIS — M79652 Pain in left thigh: Secondary | ICD-10-CM

## 2018-12-08 DIAGNOSIS — Z9889 Other specified postprocedural states: Secondary | ICD-10-CM

## 2018-12-08 DIAGNOSIS — S7012XA Contusion of left thigh, initial encounter: Secondary | ICD-10-CM

## 2018-12-08 DIAGNOSIS — F17200 Nicotine dependence, unspecified, uncomplicated: Secondary | ICD-10-CM

## 2018-12-08 DIAGNOSIS — Z95828 Presence of other vascular implants and grafts: Secondary | ICD-10-CM

## 2018-12-08 DIAGNOSIS — I779 Disorder of arteries and arterioles, unspecified: Secondary | ICD-10-CM

## 2018-12-08 MED ORDER — TRAMADOL HCL 50 MG PO TABS: 50.0000 mg | ORAL_TABLET | Freq: Four times a day (QID) | ORAL | 0 refills | Status: DC | PRN

## 2018-12-08 NOTE — Progress Notes (Signed)
[facility] [place]   CC: Follow up peripheral artery occlusive disease  History of Present Illness Sean [name] is a 48 y.o. male who isstatus post left lower extremity revascularization(harvest of left greater saphenous vein, left above-knee popliteal to posterior tibial artery bypass with non-reversed ipsilateral greater saphenous vein, left lower extremity 4 compartment fasciotomies, and application of negative pressure dressing) on 10-22-18 by Dr. Randie Heinzain and Dr. Tessie Ekeicksonfor acute on chronic disease.   Dr. Randie Heinzain last evaluated pt on 10-21-18. At that timept was havingsignificant pain in his leg with swelling and there was a wound VAC to help with healing of the medial fasciotomy site. Dr. Randie Heinzain advisedfollow-up in 1 month for wound checkand continue wound VAC. We consider this to be acute on chronic disease not primarily thromboembolic andDr. Randie Heinzain advised that ptcontinue on only aspirin lifelong.  He returned on 11-28-18 with c/o throbbing pain, warm to touch at medial thigh incision, that started a couple of weeks prior, was worsening, he denied fever or chills, denied drainage from the the thigh incision. He stated that the left calf fasciotomy site was improving with wound vac.  When I saw him that day he had pain at the more anterior of the two medial thigh incisions, has what seems to be a hematoma under that incision, which is the likely source of the pain; this would take time to resolve.  There was some warmth to touch at that incision and mild erythema, no fever or chills, Keflex started. There were no signs of ischemia in both of his lower extremities, bilateral pedal pulses were palpable, pt was to return to clinic in 2 weeks with left calf fasciotomy wound check.  He was to continue wound vac to left calf improving fasciotomy site.  Left medial thigh incision was painful, likely from a hematoma underneath incision.  He returns today after Lawanna KobusAngel with  [facility] called.  Patient having more left knee swelling and pain and leg is warm to the touch.  He just finished a round of antibiotics.  His left calf fasciotomy wound is shallow and he can probably come off of his Wound Vac.  Pt had an appt scheduled for Monday 4/20, but feels he should be seen earlier.  He is having the same pain today as he was having on 11-28-18. He finished the Keflex.  He is rubbing his left knee, but states this helps ease the pain at his medial thigh, he pointed to the site of pain as the more anterior of the two medial thigh healed incisions. He denies pain in his left knee.   HH visits pt3x/week to reapply the wound vac dressing.  His PCP is [employer] PA-C [facility].  Pt denies any known history of stroke or MI.   Diabetic:No Tobacco ZOX:WRUEAVuse:smoker (8 cigs/day, decreased from 2/3 ppd), is currently taking Chantix  Pt meds include: Statin :No Betablocker:No ASA:Yes Other anticoagulants/antiplatelets:no    Past Medical History:  Diagnosis Date  . Hypertension     Social History Social History   Tobacco Use  . Smoking status: Current Every Day Smoker    Packs/day: 1.00    Types: Cigarettes  . Smokeless tobacco: Never Used  Substance Use Topics  . Alcohol use: Never    Frequency: Never  . Drug use: Never    Family History Family History  Problem Relation Age of Onset  . Diabetes Father     Past Surgical History:  Procedure Laterality Date  . EMBOLECTOMY  Left 09/21/2018   Procedure: THROMBECTOMY LEFT POPLITEAL ARTERY BYPASS LEFT POPLITEAL TO TIBIAL ARTERY WITH SAPHENOUS VEIN HARVEST - FASCIOTOMY;  Surgeon: Chuck Hint, MD;  Location: [facility] OR;  Service: Vascular;  Laterality: Left;    No Known Allergies  Current Outpatient Medications  Medication Sig Dispense Refill  . amLODipine (NORVASC) 10 MG tablet Take 10 mg by mouth daily.    Marland Kitchen aspirin EC 81 MG tablet Take 81 mg by mouth daily.    Marland Kitchen docusate sodium  (COLACE) 100 MG capsule Take 100 mg by mouth 2 (two) times daily as needed (constipation).    Marland Kitchen lisinopril-hydrochlorothiazide (PRINZIDE,ZESTORETIC) 20-25 MG tablet Take by mouth.    . cephALEXin (KEFLEX) 500 MG capsule Take 1 capsule (500 mg total) by mouth 4 (four) times daily. (Patient not taking: Reported on 12/08/2018) 40 capsule 0  . oxyCODONE-acetaminophen (PERCOCET) 10-325 MG tablet Take 1 tablet by mouth every 4 (four) hours as needed for pain. (Patient not taking: Reported on 12/08/2018) 30 tablet 0   No current facility-administered medications for this visit.     ROS: See HPI for pertinent positives and negatives.   Physical Examination  Vitals:   12/08/18 1252  BP: 130/85  Pulse: (!) 114  Resp: 18  Temp: 97.6 F (36.4 C)  TempSrc: Oral  SpO2: 100%  Weight: 289 lb (131.1 kg)  Height: 6' 5.5" (1.969 m)   Body mass index is 33.83 kg/m.  General: A&O x 3, WDWN, obese but fit appearing male. Gait: limp HENT: No gross abnormalities.  Eyes: PERRLA. Pulmonary: Respirations are non labored, CTAB, good air movement in all fields Cardiac: regular rhythm, no detected murmur.      Radial pulses are 2+ palpable bilaterally   Adominal aortic pulse is not palpable                         VASCULAR EXAM: Extremities without ischemic changes, without Gangrene; with open wound, healing fasciotomy site, see photo below. .  Granulating and contracting left medial calf fasciotomy site. Painful area is not fasciotomy site, not left knee, is area just posterior to left medial thigh incision. No signs of infection, no erythema, no fluctuance, no drainage. Appears to be the continuation of a hematoma.                                                                                                            LE Pulses Right Left       FEMORAL  2+ palpable  2+ palpable        POPLITEAL  not palpable   not palpable       POSTERIOR TIBIAL  not palpable   2+ palpable        DORSALIS  PEDIS      ANTERIOR TIBIAL not palpable  not palpable    Abdomen: soft, NT, no palpable masses. Skin: no rashes, no cellulitis, no ulcers noted. See Extremities and photo.  Musculoskeletal: no muscle wasting or atrophy.  Neurologic: A&O X 3;  appropriate affect, Sensation is normal; MOTOR FUNCTION:  moving all extremities equally, motor strength 5/5 throughout. Speech is fluent/normal. CN 2-12 intact. Psychiatric: Thought content is normal, mood appropriate for clinical situation.    ASSESSMENT: Kerrin Coit is a 48 y.o. male who is status post left lower extremity revascularization(harvest of left greater saphenous vein, left above-knee popliteal to posterior tibial artery bypass with non-reversed ipsilateral greater saphenous vein, left lower extremity 4 compartment fasciotomies, and application of negative pressure dressing) on 10-22-18 by Dr. Randie Heinz and Dr. Edilia Bo.  He has continued pain at the more anterior of the two medial thigh incisions, has what seems to be a hematoma under that incision, which is the likely source of the pain; this will take time to resolve.  He finished the course of Keflex that I prescribed when I saw him on 11-28-18.   There are no signs of ischemia in both of his lower extremities, left PT pulses is palpable.  Consider statin therapy which is associated with a greater reduction in CVD risk and improved endothelial function, will defer to PCP.  Pain in medial left thigh, not left knee, same as when I saw him on 11-28-18. No erythema at painful area, no open wound where he has pain. Will prescribe tramadol for pain, return in a month for arterial duplex of left leg and ABI's. I advised pt to notify us if he develops fever or chills, or new or worsening problems with his legs.  Left calf fasciotomy site is healing well, no need to continue wound vac, see Plan.  Current tobacco use, is decreasing use, continue efforts re this.     PLAN:  Based on the  patient's vascular studies and examination, and after discussing with Dr. Darrick Penna, pt will return to clinic in 1 month with left leg arterial duplex and ABI's.  Tramadol for left medial thigh pain which seems to be from a hematoma: 50 mg po q6h prn pain, disp # 40, 0 refills, printed script given.   May stop wound vac to left calf healing fasciotomy, instead apply daily hydrogel or NS wet to dry dressings.    I discussed in depth with the patient the nature of atherosclerosis, and emphasized the importance of maximal medical management including strict control of blood pressure, blood glucose, and lipid levels, obtaining regular exercise, and cessation of smoking.  The patient is aware that without maximal medical management the underlying atherosclerotic disease process will progress, limiting the benefit of any interventions.  The patient was given information about PAD including signs, symptoms, treatment, what symptoms should prompt the patient to seek immediate medical care, and risk reduction measures to take.  Charisse March, RN, MSN, FNP-C [facility] Phone: [phone]  Clinic MD: Darrick Penna  12/08/18 1:14 PM

## 2018-12-08 NOTE — Patient Instructions (Signed)
Steps to Quit Smoking  Smoking tobacco can be bad for your health. It can also affect almost every organ in your body. Smoking puts you and people around you at risk for many serious long-lasting (chronic) diseases. Quitting smoking is hard, but it is one of the best things that you can do for your health. It is never too late to quit. What are the benefits of quitting smoking? When you quit smoking, you lower your risk for getting serious diseases and conditions. They can include:  Lung cancer or lung disease.  Heart disease.  Stroke.  Heart attack.  Not being able to have children (infertility).  Weak bones (osteoporosis) and broken bones (fractures). If you have coughing, wheezing, and shortness of breath, those symptoms may get better when you quit. You may also get sick less often. If you are pregnant, quitting smoking can help to lower your chances of having a baby of low birth weight. What can I do to help me quit smoking? Talk with your doctor about what can help you quit smoking. Some things you can do (strategies) include:  Quitting smoking totally, instead of slowly cutting back how much you smoke over a period of time.  Going to in-person counseling. You are more likely to quit if you go to many counseling sessions.  Using resources and support systems, such as: ? Online chats with a counselor. ? Phone quitlines. ? Printed self-help materials. ? Support groups or group counseling. ? Text messaging programs. ? Mobile phone apps or applications.  Taking medicines. Some of these medicines may have nicotine in them. If you are pregnant or breastfeeding, do not take any medicines to quit smoking unless your doctor says it is okay. Talk with your doctor about counseling or other things that can help you. Talk with your doctor about using more than one strategy at the same time, such as taking medicines while you are also going to in-person counseling. This can help make  quitting easier. What things can I do to make it easier to quit? Quitting smoking might feel very hard at first, but there is a lot that you can do to make it easier. Take these steps:  Talk to your family and friends. Ask them to support and encourage you.  Call phone quitlines, reach out to support groups, or work with a counselor.  Ask people who smoke to not smoke around you.  Avoid places that make you want (trigger) to smoke, such as: ? Bars. ? Parties. ? Smoke-break areas at work.  Spend time with people who do not smoke.  Lower the stress in your life. Stress can make you want to smoke. Try these things to help your stress: ? Getting regular exercise. ? Deep-breathing exercises. ? Yoga. ? Meditating. ? Doing a body scan. To do this, close your eyes, focus on one area of your body at a time from head to toe, and notice which parts of your body are tense. Try to relax the muscles in those areas.  Download or buy apps on your mobile phone or tablet that can help you stick to your quit plan. There are many free apps, such as QuitGuide from the CDC (Centers for Disease Control and Prevention). You can find more support from smokefree.gov and other websites. This information is not intended to replace advice given to you by your health care provider. Make sure you discuss any questions you have with your health care provider. Document Released: 06/06/2009 Document Revised: 04/07/2016   Document Reviewed: 12/25/2014 Elsevier Interactive Patient Education  2019 Elsevier Inc.     Peripheral Vascular Disease  Peripheral vascular disease (PVD) is a disease of the blood vessels that are not part of your heart and brain. A simple term for PVD is poor circulation. In most cases, PVD narrows the blood vessels that carry blood from your heart to the rest of your body. This can reduce the supply of blood to your arms, legs, and internal organs, like your stomach or kidneys. However, PVD most  often affects a person's lower legs and feet. Without treatment, PVD tends to get worse. PVD can also lead to acute ischemic limb. This is when an arm or leg suddenly cannot get enough blood. This is a medical emergency. Follow these instructions at home: Lifestyle  Do not use any products that contain nicotine or tobacco, such as cigarettes and e-cigarettes. If you need help quitting, ask your doctor.  Lose weight if you are overweight. Or, stay at a healthy weight as told by your doctor.  Eat a diet that is low in fat and cholesterol. If you need help, ask your doctor.  Exercise regularly. Ask your doctor for activities that are right for you. General instructions  Take over-the-counter and prescription medicines only as told by your doctor.  Take good care of your feet: ? Wear comfortable shoes that fit well. ? Check your feet often for any cuts or sores.  Keep all follow-up visits as told by your doctor This is important. Contact a doctor if:  You have cramps in your legs when you walk.  You have leg pain when you are at rest.  You have coldness in a leg or foot.  Your skin changes.  You are unable to get or have an erection (erectile dysfunction).  You have cuts or sores on your feet that do not heal. Get help right away if:  Your arm or leg turns cold, numb, and blue.  Your arms or legs become red, warm, swollen, painful, or numb.  You have chest pain.  You have trouble breathing.  You suddenly have weakness in your face, arm, or leg.  You become very confused or you cannot speak.  You suddenly have a very bad headache.  You suddenly cannot see. Summary  Peripheral vascular disease (PVD) is a disease of the blood vessels.  A simple term for PVD is poor circulation. Without treatment, PVD tends to get worse.  Treatment may include exercise, low fat and low cholesterol diet, and quitting smoking. This information is not intended to replace advice given to  you by your health care provider. Make sure you discuss any questions you have with your health care provider. Document Released: 11/04/2009 Document Revised: 09/17/2016 Document Reviewed: 09/17/2016 Elsevier Interactive Patient Education  2019 Elsevier Inc.  

## 2018-12-08 NOTE — Telephone Encounter (Signed)
[name] with [facility] called.  Patient having more knee swelling and pain and leg is warm to the touch.  He just finished a round of antibiotics.  His wound had not depth and he can probably come off of his Wound Vac.  Pt had an appt scheduled for Monday 4/20, but feels he should be seen earlier.  Pt will be scheduled to see Rosalita Chessman today, at 1pm.     Ernst Spell., LPN

## 2018-12-12 ENCOUNTER — Ambulatory Visit: Payer: BLUE CROSS/BLUE SHIELD | Admitting: Family

## 2018-12-13 ENCOUNTER — Telehealth (HOSPITAL_COMMUNITY): Payer: Self-pay | Admitting: Rehabilitation

## 2018-12-13 NOTE — Telephone Encounter (Signed)
The above patient or their representative was contacted and gave the following answers to these questions:         Do you have any of the following symptoms? No  Fever                    Cough                   Shortness of breath  Do  you have any of the following other symptoms? No   muscle pain         vomiting,        diarrhea        rash         weakness        red eye        abdominal pain         bruising          bruising or bleeding              joint pain           severe headache    Have you been in contact with someone who was or has been sick in the past 2 weeks? No  Yes                 Unsure                         Unable to assess   Does the person that you were in contact with have any of the following symptoms?   Cough         shortness of breath           muscle pain         vomiting,            diarrhea            rash            weakness           fever            red eye           abdominal pain           bruising  or  bleeding                joint pain                severe headache               Have you  or someone you have been in contact with traveled internationally in th last month? No        If yes, which countries?   Have you  or someone you have been in contact with traveled outside [place] in th last month? No         If yes, which state and city?   COMMENTS OR ACTION PLAN FOR THIS PATIENT:          

## 2018-12-14 ENCOUNTER — Encounter: Payer: Self-pay | Admitting: Vascular Surgery

## 2018-12-14 ENCOUNTER — Ambulatory Visit (INDEPENDENT_AMBULATORY_CARE_PROVIDER_SITE_OTHER): Payer: Self-pay | Admitting: Vascular Surgery

## 2018-12-14 ENCOUNTER — Other Ambulatory Visit: Payer: Self-pay | Admitting: *Deleted

## 2018-12-14 ENCOUNTER — Other Ambulatory Visit: Payer: Self-pay

## 2018-12-14 VITALS — BP 137/93 | HR 100 | Temp 98.9°F | Resp 20 | Ht 77.5 in | Wt 283.4 lb

## 2018-12-14 DIAGNOSIS — Z48812 Encounter for surgical aftercare following surgery on the circulatory system: Secondary | ICD-10-CM

## 2018-12-14 DIAGNOSIS — S7012XA Contusion of left thigh, initial encounter: Secondary | ICD-10-CM

## 2018-12-14 DIAGNOSIS — I739 Peripheral vascular disease, unspecified: Secondary | ICD-10-CM

## 2018-12-14 NOTE — Progress Notes (Signed)
Patient name: Sean [name] Ziemba MRN: [id number] DOB: 08/07/1971 Sex: male  REASON FOR VISIT:   Hematoma left leg.  The consult is requested by the home health nurse.  HPI:   Sean [name] Cody is a pleasant 48 y.o. male who had presented to the emergency department with a 3-day history of left calf pain.  Prior to that he had described a several month history of left calf claudication.  He had a CT angiogram performed which showed occlusion of the popliteal artery on the left.  There was reconstitution of a very small posterior tibial artery.  On 09/21/2018 the patient underwent a left above-the-knee popliteal to posterior tibial artery bypass with a vein graft in addition to 4 compartment fasciotomies by Dr. Randie Heinzain.  He was last seen in our office by the nurse practitioner on 12/08/2018.  He did have pain on 1 of the medial thigh incisions and was felt to likely have some hematoma which would resolve.  He had just finished a course of Keflex which had been prescribed on 11/28/2018.  At that time he had a palpable left posterior tibial pulse.  The patient tells me that he was not especially concerned about the swelling in the distal medial left thigh but that the home health nurse was quite concerned.  He denies fever or chills.  His wife has been doing his dressing change to his medial fasciotomy site.  Past Medical History:  Diagnosis Date  . Hypertension     Family History  Problem Relation Age of Onset  . Diabetes Father     SOCIAL HISTORY: Social History   Tobacco Use  . Smoking status: Current Every Day Smoker    Packs/day: 1.00    Types: Cigarettes  . Smokeless tobacco: Never Used  Substance Use Topics  . Alcohol use: Never    Frequency: Never    No Known Allergies  Current Outpatient Medications  Medication Sig Dispense Refill  . amLODipine (NORVASC) 10 MG tablet Take 10 mg by mouth daily.    Marland Kitchen. aspirin EC 81 MG tablet Take 81 mg by mouth daily.    Marland Kitchen. docusate sodium (COLACE)  100 MG capsule Take 100 mg by mouth 2 (two) times daily as needed (constipation).    Marland Kitchen. lisinopril-hydrochlorothiazide (PRINZIDE,ZESTORETIC) 20-25 MG tablet Take by mouth.    . traMADol (ULTRAM) 50 MG tablet Take 1 tablet (50 mg total) by mouth every 6 (six) hours as needed. 40 tablet 0  . varenicline (CHANTIX PAK) 0.5 MG X 11 & 1 MG X 42 tablet Take one 0.5 mg tab by mouth once daily for 3 days, increase to one 0.5 mg tab twice daily for 4 days, increase to 1 mg tab twice daily.    Marland Kitchen. oxyCODONE-acetaminophen (PERCOCET) 10-325 MG tablet Take 1 tablet by mouth every 4 (four) hours as needed for pain. (Patient not taking: Reported on 12/08/2018) 30 tablet 0   No current facility-administered medications for this visit.     REVIEW OF SYSTEMS:  [X]  denotes positive finding, [ ]  denotes negative finding Cardiac  Comments:  Chest pain or chest pressure:    Shortness of breath upon exertion:    Short of breath when lying flat:    Irregular heart rhythm:        Vascular    Pain in calf, thigh, or hip brought on by ambulation:    Pain in feet at night that wakes you up from your sleep:     Blood clot in your veins:  Leg swelling:         Pulmonary    Oxygen at home:    Productive cough:     Wheezing:         Neurologic    Sudden weakness in arms or legs:     Sudden numbness in arms or legs:     Sudden onset of difficulty speaking or slurred speech:    Temporary loss of vision in one eye:     Problems with dizziness:         Gastrointestinal    Blood in stool:     Vomited blood:         Genitourinary    Burning when urinating:     Blood in urine:        Psychiatric    Major depression:         Hematologic    Bleeding problems:    Problems with blood clotting too easily:        Skin    Rashes or ulcers:        Constitutional    Fever or chills:     PHYSICAL EXAM:   Vitals:   12/14/18 0855  BP: (!) 137/93  Pulse: 100  Resp: 20  Temp: 98.9 F (37.2 C)  SpO2: 100%   Weight: 283 lb 6.4 oz (128.5 kg)  Height: 6' 5.5" (1.969 m)    GENERAL: The patient is a well-nourished male, in no acute distress. The vital signs are documented above. CARDIAC: There is a regular rate and rhythm.  VASCULAR: He has a biphasic posterior tibial signal on the left with the Doppler. PULMONARY: There is good air exchange bilaterally without wheezing or rales. SKIN: His medial fasciotomy site in the left leg measures 3 cm in width by 15 cm in length. He does have some mild swelling at his medial distal thigh incision consistent with some underlying hematoma.  This is not fluctuant and is fairly firm.      DATA:    No new data  MEDICAL ISSUES:   STATUS POST LEFT ABOVE-KNEE POP TO POSTERIOR TIBIAL ARTERY BYPASS WITH VEIN: This patient comes in for routine follow-up visit.  He does have some swelling in the medial distal thigh incision consistent with an underlying hematoma.  This is not fluctuant and I do not see any value in attempting to drain this is I do not think this would be productive.  I suspect that the hematoma is diffused into the tissue.  I explained that this may take months to resolve.  Currently it is not especially symptomatic.  There is no signs of infection.  I think his bypass graft is patent.  He has a posterior tibial signal with the Doppler.  His wife will continue his dressing changes to the medial fasciotomy site with hydrogel.  He has a routine visit scheduled with ABIs and a graft duplex on 01/12/2019 with our nurse practitioner.  I have encouraged him to keep that appointment.  Waverly Ferrari [facility] [phone]

## 2018-12-15 ENCOUNTER — Ambulatory Visit: Payer: BLUE CROSS/BLUE SHIELD | Admitting: Family

## 2018-12-15 ENCOUNTER — Encounter (HOSPITAL_COMMUNITY): Payer: BLUE CROSS/BLUE SHIELD

## 2018-12-15 ENCOUNTER — Other Ambulatory Visit (HOSPITAL_COMMUNITY): Payer: BLUE CROSS/BLUE SHIELD

## 2018-12-21 ENCOUNTER — Other Ambulatory Visit: Payer: Self-pay

## 2018-12-21 DIAGNOSIS — I739 Peripheral vascular disease, unspecified: Secondary | ICD-10-CM

## 2018-12-21 DIAGNOSIS — I779 Disorder of arteries and arterioles, unspecified: Secondary | ICD-10-CM

## 2019-01-12 ENCOUNTER — Other Ambulatory Visit: Payer: Self-pay

## 2019-01-12 ENCOUNTER — Ambulatory Visit (INDEPENDENT_AMBULATORY_CARE_PROVIDER_SITE_OTHER)
Admission: RE | Admit: 2019-01-12 | Discharge: 2019-01-12 | Disposition: A | Discharge status: Home / Self Care | Payer: BLUE CROSS/BLUE SHIELD | Source: Ambulatory Visit | Attending: Family | Admitting: Family

## 2019-01-12 ENCOUNTER — Encounter: Payer: Self-pay | Admitting: Family

## 2019-01-12 ENCOUNTER — Ambulatory Visit: Payer: BLUE CROSS/BLUE SHIELD | Admitting: Family

## 2019-01-12 ENCOUNTER — Ambulatory Visit (HOSPITAL_COMMUNITY)
Admission: RE | Admit: 2019-01-12 | Discharge: 2019-01-12 | Disposition: A | Discharge status: Home / Self Care | Payer: BLUE CROSS/BLUE SHIELD | Source: Ambulatory Visit | Attending: Family | Admitting: Family

## 2019-01-12 VITALS — BP 147/95 | HR 81 | Temp 97.6°F | Resp 18 | Ht 77.5 in | Wt 289.0 lb

## 2019-01-12 DIAGNOSIS — Z9889 Other specified postprocedural states: Secondary | ICD-10-CM

## 2019-01-12 DIAGNOSIS — F17200 Nicotine dependence, unspecified, uncomplicated: Secondary | ICD-10-CM | POA: Diagnosis not present

## 2019-01-12 DIAGNOSIS — Z95828 Presence of other vascular implants and grafts: Secondary | ICD-10-CM

## 2019-01-12 DIAGNOSIS — I779 Disorder of arteries and arterioles, unspecified: Secondary | ICD-10-CM

## 2019-01-12 NOTE — Patient Instructions (Signed)
Steps to Quit Smoking  Smoking tobacco can be bad for your health. It can also affect almost every organ in your body. Smoking puts you and people around you at risk for many serious long-lasting (chronic) diseases. Quitting smoking is hard, but it is one of the best things that you can do for your health. It is never too late to quit. What are the benefits of quitting smoking? When you quit smoking, you lower your risk for getting serious diseases and conditions. They can include:  Lung cancer or lung disease.  Heart disease.  Stroke.  Heart attack.  Not being able to have children (infertility).  Weak bones (osteoporosis) and broken bones (fractures). If you have coughing, wheezing, and shortness of breath, those symptoms may get better when you quit. You may also get sick less often. If you are pregnant, quitting smoking can help to lower your chances of having a baby of low birth weight. What can I do to help me quit smoking? Talk with your doctor about what can help you quit smoking. Some things you can do (strategies) include:  Quitting smoking totally, instead of slowly cutting back how much you smoke over a period of time.  Going to in-person counseling. You are more likely to quit if you go to many counseling sessions.  Using resources and support systems, such as: ? Online chats with a counselor. ? Phone quitlines. ? Printed self-help materials. ? Support groups or group counseling. ? Text messaging programs. ? Mobile phone apps or applications.  Taking medicines. Some of these medicines may have nicotine in them. If you are pregnant or breastfeeding, do not take any medicines to quit smoking unless your doctor says it is okay. Talk with your doctor about counseling or other things that can help you. Talk with your doctor about using more than one strategy at the same time, such as taking medicines while you are also going to in-person counseling. This can help make  quitting easier. What things can I do to make it easier to quit? Quitting smoking might feel very hard at first, but there is a lot that you can do to make it easier. Take these steps:  Talk to your family and friends. Ask them to support and encourage you.  Call phone quitlines, reach out to support groups, or work with a counselor.  Ask people who smoke to not smoke around you.  Avoid places that make you want (trigger) to smoke, such as: ? Bars. ? Parties. ? Smoke-break areas at work.  Spend time with people who do not smoke.  Lower the stress in your life. Stress can make you want to smoke. Try these things to help your stress: ? Getting regular exercise. ? Deep-breathing exercises. ? Yoga. ? Meditating. ? Doing a body scan. To do this, close your eyes, focus on one area of your body at a time from head to toe, and notice which parts of your body are tense. Try to relax the muscles in those areas.  Download or buy apps on your mobile phone or tablet that can help you stick to your quit plan. There are many free apps, such as QuitGuide from the CDC (Centers for Disease Control and Prevention). You can find more support from smokefree.gov and other websites. This information is not intended to replace advice given to you by your health care provider. Make sure you discuss any questions you have with your health care provider. Document Released: 06/06/2009 Document Revised: 04/07/2016   Document Reviewed: 12/25/2014 Elsevier Interactive Patient Education  2019 Elsevier Inc.     Peripheral Vascular Disease  Peripheral vascular disease (PVD) is a disease of the blood vessels that are not part of your heart and brain. A simple term for PVD is poor circulation. In most cases, PVD narrows the blood vessels that carry blood from your heart to the rest of your body. This can reduce the supply of blood to your arms, legs, and internal organs, like your stomach or kidneys. However, PVD most  often affects a person's lower legs and feet. Without treatment, PVD tends to get worse. PVD can also lead to acute ischemic limb. This is when an arm or leg suddenly cannot get enough blood. This is a medical emergency. Follow these instructions at home: Lifestyle  Do not use any products that contain nicotine or tobacco, such as cigarettes and e-cigarettes. If you need help quitting, ask your doctor.  Lose weight if you are overweight. Or, stay at a healthy weight as told by your doctor.  Eat a diet that is low in fat and cholesterol. If you need help, ask your doctor.  Exercise regularly. Ask your doctor for activities that are right for you. General instructions  Take over-the-counter and prescription medicines only as told by your doctor.  Take good care of your feet: ? Wear comfortable shoes that fit well. ? Check your feet often for any cuts or sores.  Keep all follow-up visits as told by your doctor This is important. Contact a doctor if:  You have cramps in your legs when you walk.  You have leg pain when you are at rest.  You have coldness in a leg or foot.  Your skin changes.  You are unable to get or have an erection (erectile dysfunction).  You have cuts or sores on your feet that do not heal. Get help right away if:  Your arm or leg turns cold, numb, and blue.  Your arms or legs become red, warm, swollen, painful, or numb.  You have chest pain.  You have trouble breathing.  You suddenly have weakness in your face, arm, or leg.  You become very confused or you cannot speak.  You suddenly have a very bad headache.  You suddenly cannot see. Summary  Peripheral vascular disease (PVD) is a disease of the blood vessels.  A simple term for PVD is poor circulation. Without treatment, PVD tends to get worse.  Treatment may include exercise, low fat and low cholesterol diet, and quitting smoking. This information is not intended to replace advice given to  you by your health care provider. Make sure you discuss any questions you have with your health care provider. Document Released: 11/04/2009 Document Revised: 09/17/2016 Document Reviewed: 09/17/2016 Elsevier Interactive Patient Education  2019 Elsevier Inc.  

## 2019-01-12 NOTE — Progress Notes (Signed)
[facility] [place]   CC: Follow up peripheral artery occlusive disease  History of Present Illness Sean [name] is a 48 y.o. male who had presented to the emergency department with a 3-day history of left calf pain.  Prior to that he had described a several month history of left calf claudication.  He had a CT angiogram performed which showed occlusion of the popliteal artery on the left.  There was reconstitution of a very small posterior tibial artery.  On 09/21/2018 the patient underwent a left above-the-knee popliteal to posterior tibial artery bypass with a vein graft in addition to 4 compartment fasciotomies by Dr. Randie Heinz.  Pt was last evaluated by [facility] on 12-14-18 by Dr. Edilia Bo. At that time there was swelling in the medial distal thigh incision consistent with an underlying hematoma.  This was not fluctuant and Dr. Edilia Bo did not see any value in attempting to drain this is he did not think this would be productive, he suspected that the hematoma was diffused into the tissue.  This may take months to resolve.  Pt was not especially symptomatic.  There were no signs of infection.  Dr. Edilia Bo thought that the bypass graft was patent.  He had a posterior tibial signal with the Doppler.  His wife was to continue his dressing changes to the medial fasciotomy site with hydrogel.   He returns today for routine visit with ABIs and a graft duplex.  His PCP is [employer] PA-C with [facility].  Pt denies any known history of stroke or MI.   His right hip hurts with walking, he denies any known arthritis, denies back problems. But he states that he used to get steroid shots in his left hip, which helped somewhat.    He denies dyspnea or chest pain.   Diabetic: No Tobacco use: smoker  (6-7 cigs/day, decreased from 1 ppd, started smoking about age 43 yrs), he is taking Chantix  Pt meds include: Statin :No Betablocker: No ASA: Yes Other  anticoagulants/antiplatelets: no  Past Medical History:  Diagnosis Date  . Hypertension   . PVD (peripheral vascular disease) (HCC)     Social History Social History   Tobacco Use  . Smoking status: Current Every Day Smoker    Packs/day: 1.00    Types: Cigarettes  . Smokeless tobacco: Never Used  Substance Use Topics  . Alcohol use: Never    Frequency: Never  . Drug use: Never    Family History Family History  Problem Relation Age of Onset  . Diabetes Father     Past Surgical History:  Procedure Laterality Date  . EMBOLECTOMY Left 09/21/2018   Procedure: THROMBECTOMY LEFT POPLITEAL ARTERY BYPASS LEFT POPLITEAL TO TIBIAL ARTERY WITH SAPHENOUS VEIN HARVEST - FASCIOTOMY;  Surgeon: Chuck Hint, MD;  Location: [facility] OR;  Service: Vascular;  Laterality: Left;    No Known Allergies  Current Outpatient Medications  Medication Sig Dispense Refill  . amLODipine (NORVASC) 10 MG tablet Take 10 mg by mouth daily.    Marland Kitchen aspirin EC 81 MG tablet Take 81 mg by mouth daily.    Marland Kitchen docusate sodium (COLACE) 100 MG capsule Take 100 mg by mouth 2 (two) times daily as needed (constipation).    Marland Kitchen lisinopril-hydrochlorothiazide (PRINZIDE,ZESTORETIC) 20-25 MG tablet Take by mouth.    . traMADol (ULTRAM) 50 MG tablet Take 1 tablet (50 mg total) by mouth every 6 (six) hours as needed. 40 tablet 0  . varenicline (CHANTIX PAK) 0.5 MG  X 11 & 1 MG X 42 tablet Take one 0.5 mg tab by mouth once daily for 3 days, increase to one 0.5 mg tab twice daily for 4 days, increase to 1 mg tab twice daily.    Marland Kitchen oxyCODONE-acetaminophen (PERCOCET) 10-325 MG tablet Take 1 tablet by mouth every 4 (four) hours as needed for pain. (Patient not taking: Reported on 01/12/2019) 30 tablet 0   No current facility-administered medications for this visit.     ROS: See HPI for pertinent positives and negatives.   Physical Examination  Vitals:   01/12/19 1330  BP: (!) 147/95  Pulse: 81  Resp: 18  Temp: 97.6 F  (36.4 C)  TempSrc: Oral  SpO2: 100%  Weight: 289 lb (131.1 kg)  Height: 6' 5.5" (1.969 m)   Body mass index is 33.83 kg/m.  General: A&O x 3, WDWN, obese but fit appearing male. Gait: slight limp HEENT: No gross abnormalities.  Pulmonary: Respirations are non labored, limited air movement in all fields, no rales, rhonchi, or wheezes Cardiac: regular rhythm, no detected murmur.         Carotid Bruits Right Left   Negative Negative   Radial pulses are 2+ palpable bilaterally   Adominal aortic pulse is not palpable                         VASCULAR EXAM: Extremities without ischemic changes, without Gangrene; with open wound at left lower leg healing fasciotomy site. See photo below.  Hematoma palpated left lower thigh, medial aspect.    Left lower leg, medial aspect, granulating fasciotomy site                                                                                                            LE Pulses Right Left       FEMORAL  2+ palpable  2+ palpable        POPLITEAL  2+ palpable   not palpable       POSTERIOR TIBIAL  not palpable   2+ palpable        DORSALIS PEDIS      ANTERIOR TIBIAL not palpable  not palpable    Abdomen: soft, NT, no palpable masses. Skin: no rashes, no cellulitis, no ulcers noted. Musculoskeletal: no muscle wasting or atrophy.  Neurologic: A&O X 3; appropriate affect, Sensation is normal; MOTOR FUNCTION:  moving all extremities equally, motor strength 5/5 throughout. Speech is fluent/normal. CN 2-12 intact. Psychiatric: Thought content is normal, mood appropriate for clinical situation.     ASSESSMENT: Sean [name] is a 48 y.o. male who is status post left lower extremity revascularization(harvest of left greater saphenous vein, left above-knee popliteal to posterior tibial artery bypass with non-reversed ipsilateral greater saphenous vein, left lower extremity 4 compartment fasciotomies, and application of negative pressure  dressing) on 10-22-18 by Dr. Randie Heinz and Dr. Edilia Bo.    Hematoma at left medial distal thigh palpated and noted on duplex of left leg, has been present, anticipate slow absorption.  Left lower leg fasciotomy site is granulating well, no signs of infection, see Plan.  Left pedal pulses are palpable.   Duplex today of left leg shows no stenosis of the bypass graft, all triphasic waveforms. ABI's indicate mild disease on the right with biphasic waveforms, normal on the left with triphasic waveforms.   His atherosclerotic risk factors include 33 year history of smoking, has decreased use taking Chantix. Fortunately he does not have DM. He takes a daily 81 mg ASA. He is not taking a statin. Consider statin therapy which is associated with a greater reduction in CVD risk and improved endothelial function, will defer to PCP.    DATA  Left LE Arterial Duplex (01-12-19): +--------+--------+-----+--------+---------+--------+ RIGHT   PSV cm/sRatioStenosisWaveform Comments +--------+--------+-----+--------+---------+--------+ CFA Prox125                  triphasic         +--------+--------+-----+--------+---------+--------+   +----------+--------+-----+--------+---------+--------+ LEFT      PSV cm/sRatioStenosisWaveform Comments +----------+--------+-----+--------+---------+--------+ CFA Prox  96                   triphasic         +----------+--------+-----+--------+---------+--------+ DFA       62                   triphasic         +----------+--------+-----+--------+---------+--------+ SFA Prox  86                   triphasic         +----------+--------+-----+--------+---------+--------+ SFA Mid   79                   triphasic         +----------+--------+-----+--------+---------+--------+ SFA Distal36                   triphasicinflow   +----------+--------+-----+--------+---------+--------+    Left Graft #1:  +--------------------+--------+--------+----------+--------+                     PSV cm/sStenosisWaveform  Comments +--------------------+--------+--------+----------+--------+ Inflow              36              triphasic          +--------------------+--------+--------+----------+--------+ Proximal Anastomosis30              monophasicbrisk    +--------------------+--------+--------+----------+--------+ Proximal Graft      42              triphasic          +--------------------+--------+--------+----------+--------+ Mid Graft           37              triphasic          +--------------------+--------+--------+----------+--------+ Distal Graft        36              triphasic          +--------------------+--------+--------+----------+--------+ Distal Anastamosis  81              triphasic          +--------------------+--------+--------+----------+--------+ Outflow             160             triphasic          +--------------------+--------+--------+----------+--------+  Summary: Left Graft(s): Enlarged mass measuring 5.16 x 4.04. dst thigh  area. Patent left femoral to posterior tibial bypass graft with no evidence for restenosis.    ABI (Date: 01/12/2019): ABI Findings: +---------+------------------+-----+--------+--------+ Right    Rt Pressure (mmHg)IndexWaveformComment  +---------+------------------+-----+--------+--------+ Brachial 154                                     +---------+------------------+-----+--------+--------+ PTA      130               0.84 biphasic         +---------+------------------+-----+--------+--------+ DP       137               0.89 biphasic         +---------+------------------+-----+--------+--------+ Great Toe                               absent   +---------+------------------+-----+--------+--------+  +---------+------------------+-----+---------+-------+ Left      Lt Pressure (mmHg)IndexWaveform Comment +---------+------------------+-----+---------+-------+ Brachial 141                                     +---------+------------------+-----+---------+-------+ PTA      188               1.22 triphasic        +---------+------------------+-----+---------+-------+ DP       178               1.16 triphasic        +---------+------------------+-----+---------+-------+ Cletis MediaGreat Toe112               0.73 Normal           +---------+------------------+-----+---------+-------+  +-------+-----------+-----------+------------+------------+ ABI/TBIToday's ABIToday's TBIPrevious ABIPrevious TBI +-------+-----------+-----------+------------+------------+ Right  0.89       absent                              +-------+-----------+-----------+------------+------------+ Left   1.22       0.73                                +-------+-----------+-----------+------------+------------+   Summary: Right: Resting right ankle-brachial index indicates mild right lower extremity arterial disease. Unable to obtain toe-brachial indeces due to low amplitude of PPG waveform.  Left: Resting left ankle-brachial index is within normal range. No evidence of significant left lower extremity arterial disease. The left toe-brachial index is normal.     PLAN:  Based on the patient's vascular studies and examination, pt will return to clinic in 3 months with right LE arterial duplex and ABI's. Continue frequent short walks, and elevate feet when not walking to minimize swelling of left foot.  Continue hydrogel dressing changes by wife to left lower leg, medial aspect, fasciotomy site, until wound has completely granulated in.   He knows to notify us if he develops concerns about the circulation in his feet or legs.   I discussed in depth with the patient the nature of atherosclerosis, and emphasized the importance of maximal medical  management including strict control of blood pressure, blood glucose, and lipid levels, obtaining regular exercise, and cessation of smoking.  The patient is aware that without maximal medical management the underlying atherosclerotic disease process will  progress, limiting the benefit of any interventions.  The patient was given information about PAD including signs, symptoms, treatment, what symptoms should prompt the patient to seek immediate medical care, and risk reduction measures to take.  Charisse March, RN, MSN, FNP-C [facility] Phone: [phone]  Clinic MD: Darrick Penna  01/12/19 1:52 PM

## 2019-01-24 ENCOUNTER — Other Ambulatory Visit: Payer: Self-pay

## 2019-01-24 ENCOUNTER — Ambulatory Visit (INDEPENDENT_AMBULATORY_CARE_PROVIDER_SITE_OTHER): Payer: BLUE CROSS/BLUE SHIELD | Admitting: Physician Assistant

## 2019-01-24 VITALS — BP 156/105 | HR 99 | Temp 98.1°F | Resp 14 | Ht 77.0 in | Wt 281.0 lb

## 2019-01-24 DIAGNOSIS — Z9889 Other specified postprocedural states: Secondary | ICD-10-CM | POA: Diagnosis not present

## 2019-01-24 DIAGNOSIS — I739 Peripheral vascular disease, unspecified: Secondary | ICD-10-CM

## 2019-01-24 NOTE — Progress Notes (Signed)
HISTORY AND PHYSICAL     CC:  follow up Requesting Provider:  No ref. provider found  HPI: This is a 48 y.o. male who underwent a left AK popliteal to PTA bypass with non-reversed ipsilateral GSV on 09/21/2018 by Dr. Randie Heinz.   He returned for evaluation on 01/12/2019 and at that time, he was suspected to have a hematoma.  This was not drained as it was felt it was diffused into the tissue and could take months to resolve.  He was continuing dressing changes to the medial fasciotomy site with hydrogel.  Also at that visit, he had ABI's done with right 0.89 and left 1.22.    The pt is here for follow up.  He states that his wife uses hydrogel on his wound daily and tapes the bandage in place.  He states he has been taking sponge baths daily.  He denies having  Any fevers.  He states that his wife noticed some thick yellow drainage coming from the posterior medial portion away from the distal portion of the incision.  He states it was present last evening and this morning.  He states that his leg has been swelling since surgery.  He states that he tries to elevate it during the day, but doesn't elevate it above his heart but places a pillow under his leg.  He states his swelling is better in the am when he wakes.    The pt is not on a statin for cholesterol management.  The pt is on a daily aspirin.   Other AC:  none The pt is on CCB and ACEI for hypertension.   The pt is not diabetic.   Tobacco hx:  Current 5-10 cigarettes per day   Past Medical History:  Diagnosis Date  . Hypertension   . PVD (peripheral vascular disease) (HCC)     Past Surgical History:  Procedure Laterality Date  . EMBOLECTOMY Left 09/21/2018   Procedure: THROMBECTOMY LEFT POPLITEAL ARTERY BYPASS LEFT POPLITEAL TO TIBIAL ARTERY WITH SAPHENOUS VEIN HARVEST - FASCIOTOMY;  Surgeon: Chuck Hint, MD;  Location: [facility] OR;  Service: Vascular;  Laterality: Left;    No Known Allergies  Current Outpatient Medications   Medication Sig Dispense Refill  . amLODipine (NORVASC) 10 MG tablet Take 10 mg by mouth daily.    Marland Kitchen aspirin EC 81 MG tablet Take 81 mg by mouth daily.    Marland Kitchen docusate sodium (COLACE) 100 MG capsule Take 100 mg by mouth 2 (two) times daily as needed (constipation).    Marland Kitchen lisinopril-hydrochlorothiazide (PRINZIDE,ZESTORETIC) 20-25 MG tablet Take by mouth.    . oxyCODONE-acetaminophen (PERCOCET) 10-325 MG tablet Take 1 tablet by mouth every 4 (four) hours as needed for pain. (Patient not taking: Reported on 01/12/2019) 30 tablet 0  . traMADol (ULTRAM) 50 MG tablet Take 1 tablet (50 mg total) by mouth every 6 (six) hours as needed. 40 tablet 0  . varenicline (CHANTIX PAK) 0.5 MG X 11 & 1 MG X 42 tablet Take one 0.5 mg tab by mouth once daily for 3 days, increase to one 0.5 mg tab twice daily for 4 days, increase to 1 mg tab twice daily.     No current facility-administered medications for this visit.     Family History  Problem Relation Age of Onset  . Diabetes Father     Social History   Socioeconomic History  . Marital status: Married    Spouse name: Not on file  . Number of children: Not  on file  . Years of education: Not on file  . Highest education level: Not on file  Occupational History  . Not on file  Social Needs  . Financial resource strain: Not on file  . Food insecurity:    Worry: Not on file    Inability: Not on file  . Transportation needs:    Medical: Not on file    Non-medical: Not on file  Tobacco Use  . Smoking status: Current Every Day Smoker    Packs/day: 1.00    Types: Cigarettes  . Smokeless tobacco: Never Used  Substance and Sexual Activity  . Alcohol use: Never    Frequency: Never  . Drug use: Never  . Sexual activity: Not on file  Lifestyle  . Physical activity:    Days per week: Not on file    Minutes per session: Not on file  . Stress: Not on file  Relationships  . Social connections:    Talks on phone: Not on file    Gets together: Not on file     Attends religious service: Not on file    Active member of club or organization: Not on file    Attends meetings of clubs or organizations: Not on file    Relationship status: Not on file  . Intimate partner violence:    Fear of current or ex partner: Not on file    Emotionally abused: Not on file    Physically abused: Not on file    Forced sexual activity: Not on file  Other Topics Concern  . Not on file  Social History Narrative  . Not on file     REVIEW OF SYSTEMS:   [X]  denotes positive finding, [ ]  denotes negative finding Cardiac  Comments:  Chest pain or chest pressure:    Shortness of breath upon exertion:    Short of breath when lying flat:    Irregular heart rhythm:        Vascular    Pain in calf, thigh, or hip brought on by ambulation:    Pain in feet at night that wakes you up from your sleep:     Blood clot in your veins:    Leg swelling:  x       Pulmonary    Oxygen at home:    Productive cough:     Wheezing:         Neurologic    Sudden weakness in arms or legs:     Sudden numbness in arms or legs:     Sudden onset of difficulty speaking or slurred speech:    Temporary loss of vision in one eye:     Problems with dizziness:         Gastrointestinal    Blood in stool:     Vomited blood:         Genitourinary    Burning when urinating:     Blood in urine:        Psychiatric    Major depression:         Hematologic    Bleeding problems:    Problems with blood clotting too easily:        Skin    Rashes or ulcers: x       Constitutional    Fever or chills:      PHYSICAL EXAMINATION:  Today's Vitals   01/24/19 1408  BP: (!) 156/105  Pulse: 99  Resp: 14  Temp: 98.1 F (36.7  C)  TempSrc: Oral  SpO2: 100%  Weight: 281 lb (127.5 kg)  Height:  (1.956 m)  PainSc: 0-No pain   Body mass index is 33.32 kg/m.   General:  WDWN in NAD; vital signs documented above Gait: Not observed HENT: WNL, normocephalic Pulmonary: normal  non-labored breathing , without Rales, rhonchi,  wheezing Cardiac: regular HR, without  Murmurs without carotid bruits Skin: without rashes Vascular Exam/Pulses:  Right Left  Radial 2+ (normal) 2+ (normal)  DP  Brisk doppler signal  PT  Brisk doppler signal   Extremities: left foot is warm and well perfused with brisk doppler signals.  Below knee incision is healing but there is hardened fibrinous area over the incision.  There is no erythema or fluctuance or warmth over the area that had drainage last evening and this morning.  Unable to express any fluid.  Above knee incisions have healed nicely.   Musculoskeletal: no muscle wasting or atrophy  Neurologic: A&O X 3;  No focal weakness or paresthesias are detected Psychiatric:  The pt has Normal affect.   Non-Invasive Vascular Imaging:   None today    ASSESSMENT/PLAN:: 48 y.o. male here for follow up for left AK popliteal to PTA bypass with non-reversed ipsilateral GSV on 09/21/2018 by Dr. Randie Heinz who returns today for a wound check.    -pt with brisk doppler signals left foot.  He does have swelling and I placed a dry 4x4 gauze over his incision and wrapped with 4" ace with moderate compression.  Discussed with him to elevate his leg and wrap leg in the am with ace wrap with moderate compression and take off at night.   -pt came in today with c/o drainage that he described as thick yellow drainage.  I vigorously tried to express any fluid from the area he said drained, but I was unable to express anything out.  The area does not have any erythema or fluctuance.  There is no warmth.  He has not had any fevers.  I did not prescribe abx due to those reasons.   -I have asked him to compress and elevate and come back on 6/12 to be seen by Dr. Randie Heinz to check leg/wound.  He will call sooner should there be any changes.   -he does have some hydrogel left.  He will use this until it is gone and then dry dressing to lower leg.   -he has only been doing  a sponge bath.  I discussed with him that it is okay to shower daily with soap and water and pat dry.   -discussed smoking cessation with pt and importance. He is on Chantix and states he has decreased the amount he smokes.    Doreatha Massed, PA-C [facility] [phone]  Clinic MD:   Early

## 2019-02-03 ENCOUNTER — Other Ambulatory Visit: Payer: Self-pay

## 2019-02-03 ENCOUNTER — Ambulatory Visit (INDEPENDENT_AMBULATORY_CARE_PROVIDER_SITE_OTHER): Payer: BLUE CROSS/BLUE SHIELD | Admitting: Vascular Surgery

## 2019-02-03 ENCOUNTER — Encounter: Payer: Self-pay | Admitting: Vascular Surgery

## 2019-02-03 VITALS — BP 153/98 | HR 104 | Temp 98.3°F | Resp 18 | Ht 72.5 in | Wt 285.0 lb

## 2019-02-03 DIAGNOSIS — Z9889 Other specified postprocedural states: Secondary | ICD-10-CM

## 2019-02-03 DIAGNOSIS — I739 Peripheral vascular disease, unspecified: Secondary | ICD-10-CM | POA: Diagnosis not present

## 2019-02-03 NOTE — Progress Notes (Signed)
[name] Heizer, male   DOB: 01/09/1971, 48 y.o.   MRN: [id number]  Reason for Consult: PAD   Referred by No ref. provider found  Subjective:     HPI:  Sean [name] Macfadden is a 48 y.o. male follows up for wound check.  States he has been diagnosed with psoriasis and will undergo treatment with monoclonal antibodies for that.  Wound is otherwise healing well.  Previously had drainage but this is resolved.  Has swelling which is his chief complaint is left lower extremity.  Wound has nearly healed.  States he is unable to go back to work yet.  Past Medical History:  Diagnosis Date  . Hypertension   . PVD (peripheral vascular disease) (HCC)    Family History  Problem Relation Age of Onset  . Diabetes Father    Past Surgical History:  Procedure Laterality Date  . EMBOLECTOMY Left 09/21/2018   Procedure: THROMBECTOMY LEFT POPLITEAL ARTERY BYPASS LEFT POPLITEAL TO TIBIAL ARTERY WITH SAPHENOUS VEIN HARVEST - FASCIOTOMY;  Surgeon: Chuck Hintickson, Christopher S, MD;  [facility];  Service: Vascular;  Laterality: Left;    Short Social History:  Social History   Tobacco Use  . Smoking status: Current Every Day Smoker    Packs/day: 1.00    Types: Cigarettes  . Smokeless tobacco: Never Used  Substance Use Topics  . Alcohol use: Never    Frequency: Never    No Known Allergies  Current Outpatient Medications  Medication Sig Dispense Refill  . amLODipine (NORVASC) 10 MG tablet Take 10 mg by mouth daily.    Marland Kitchen. aspirin EC 81 MG tablet Take 81 mg by mouth daily.    Marland Kitchen. lisinopril-hydrochlorothiazide (PRINZIDE,ZESTORETIC) 20-25 MG tablet Take 2 tablets by mouth.     . varenicline (CHANTIX PAK) 0.5 MG X 11 & 1 MG X 42 tablet Take one 0.5 mg tab by mouth once daily for 3 days, increase to one 0.5 mg tab twice daily for 4 days, increase to 1 mg tab twice daily.    Marland Kitchen. docusate sodium (COLACE) 100 MG capsule Take 100 mg by mouth 2 (two) times daily as needed (constipation).    . traMADol  (ULTRAM) 50 MG tablet Take 1 tablet (50 mg total) by mouth every 6 (six) hours as needed. (Patient not taking: Reported on 02/03/2019) 40 tablet 0   No current facility-administered medications for this visit.     Review of Systems  Constitutional:  Constitutional negative. HENT: HENT negative.  Eyes: Eyes negative.  Respiratory: Respiratory negative.  Musculoskeletal: Positive for leg pain and joint pain.  Skin: Positive for rash and wound.  Neurological: Positive for numbness.  Hematologic: Hematologic/lymphatic negative.  Psychiatric: Psychiatric negative.        Objective:  Objective   Vitals:   02/03/19 1418  BP: (!) 153/98  Pulse: (!) 104  Resp: 18  Temp: 98.3 F (36.8 C)  TempSrc: Temporal  SpO2: 96%  Weight: 285 lb (129.3 kg)  Height: 6' 0.5" (1.842 m)   Body mass index is 38.12 kg/m.  Physical Exam Constitutional:      Appearance: Normal appearance.  HENT:     Head: Normocephalic.  Eyes:     Pupils: Pupils are equal, round, and reactive to light.  Cardiovascular:     Rate and Rhythm: Normal rate.     Pulses:          Dorsalis pedis pulses are 2+ on the right side.       Posterior tibial  pulses are 2+ on the right side and 2+ on the left side.  Pulmonary:     Effort: Pulmonary effort is normal.  Abdominal:     General: Abdomen is flat.     Palpations: Abdomen is soft.  Skin:    General: Skin is dry.     Comments: Scar on left medial leg is nearly healed with some heaped up skin.  There is surrounding psoriasis extends up onto the thigh and he also has psoriasis on his scalp  Neurological:     General: No focal deficit present.     Mental Status: He is alert.  Psychiatric:        Mood and Affect: Mood normal.        Thought Content: Thought content normal.        Judgment: Judgment normal.     Data: No new studies today     Assessment/Plan:     48 year old male presents for evaluation left lower extremity after bypass for acute limb  ischemia.  He will follow-up in a few more months with repeat duplex and ABIs given the decreased velocities in the bypass graft at last evaluation.  He has issues before this we can certainly see him sooner.  May possibly require angiogram in the future     Waynetta Sandy MD [facility]

## 2019-04-13 ENCOUNTER — Other Ambulatory Visit (HOSPITAL_COMMUNITY): Payer: BLUE CROSS/BLUE SHIELD

## 2019-04-13 ENCOUNTER — Encounter (HOSPITAL_COMMUNITY): Payer: BLUE CROSS/BLUE SHIELD

## 2019-04-13 ENCOUNTER — Ambulatory Visit: Payer: BLUE CROSS/BLUE SHIELD | Admitting: Family

## 2019-04-25 ENCOUNTER — Other Ambulatory Visit: Payer: Self-pay

## 2019-04-25 ENCOUNTER — Encounter (HOSPITAL_COMMUNITY): Payer: Self-pay

## 2019-04-25 ENCOUNTER — Emergency Department (HOSPITAL_COMMUNITY)
Admission: EM | Admit: 2019-04-25 | Discharge: 2019-04-25 | Disposition: A | Discharge status: Home / Self Care | Payer: BLUE CROSS/BLUE SHIELD | Attending: Emergency Medicine | Admitting: Emergency Medicine

## 2019-04-25 DIAGNOSIS — F17210 Nicotine dependence, cigarettes, uncomplicated: Secondary | ICD-10-CM | POA: Diagnosis not present

## 2019-04-25 DIAGNOSIS — M79605 Pain in left leg: Secondary | ICD-10-CM | POA: Diagnosis not present

## 2019-04-25 DIAGNOSIS — Z7982 Long term (current) use of aspirin: Secondary | ICD-10-CM | POA: Insufficient documentation

## 2019-04-25 DIAGNOSIS — R200 Anesthesia of skin: Secondary | ICD-10-CM | POA: Insufficient documentation

## 2019-04-25 DIAGNOSIS — I10 Essential (primary) hypertension: Secondary | ICD-10-CM | POA: Diagnosis not present

## 2019-04-25 DIAGNOSIS — Z79899 Other long term (current) drug therapy: Secondary | ICD-10-CM | POA: Diagnosis not present

## 2019-04-25 MED ORDER — OXYCODONE-ACETAMINOPHEN 5-325 MG PO TABS: 1.0000 | ORAL_TABLET | Freq: Four times a day (QID) | ORAL | 0 refills | Status: DC | PRN

## 2019-04-25 MED ORDER — OXYCODONE-ACETAMINOPHEN 5-325 MG PO TABS
1.0000 | ORAL_TABLET | Freq: Once | ORAL | Status: AC
  Administered 2019-04-25: 1 via ORAL
  Filled 2019-04-25: qty 1

## 2019-04-25 NOTE — ED Provider Notes (Signed)
[place] EMERGENCY DEPARTMENT Provider Note   CSN: [id number] Arrival date & time: 04/25/19  0930     History   Chief Complaint No chief complaint on file.   HPI Sean [name] is a 48 y.o. male.     HPI   48 year old male presents today with complaints of left-sided leg pain.  Patient is status post left popliteal artery bypass for acute limb ischemia in January 2020.  He notes since that time he has numbness in his left fifth and fourth toes, he also notes pain to the lower extremity.  He notes pain with ambulation to the point where he needs to use a wheelchair when he is at the grocery store.  He reports this is unchanged since the incident.  He notes he does sometimes have pain at rest.  He denies any color change to his feet or toes, no significant swelling or edema.  He was seen most recently in June 2020 by Dr. Gwenlyn Saran who has scheduled follow-up appointment this month for reevaluation.  He notes in the past he had been prescribed Ultram and Percocet and notes the Ultram did not help his symptoms currently he does not have any pain medicine at home.  Past Medical History:  Diagnosis Date  . Hypertension   . PVD (peripheral vascular disease) [facility])     Patient Active Problem List   Diagnosis Date Noted  . Ischemia of extremity 09/21/2018  . PVD (peripheral vascular disease) ([place]) 09/20/2018    Past Surgical History:  Procedure Laterality Date  . EMBOLECTOMY Left 09/21/2018   Procedure: THROMBECTOMY LEFT POPLITEAL ARTERY BYPASS LEFT POPLITEAL TO TIBIAL ARTERY WITH SAPHENOUS VEIN HARVEST - FASCIOTOMY;  Surgeon: Angelia Mould, MD;  Location: [place];  Service: Vascular;  Laterality: Left;        Home Medications    Prior to Admission medications   Medication Sig Start Date End Date Taking? Authorizing Provider  amLODipine (NORVASC) 10 MG tablet Take 10 mg by mouth daily. 11/23/18   [provider]  aspirin EC 81 MG tablet Take 81 mg by  mouth daily.    [provider]  docusate sodium (COLACE) 100 MG capsule Take 100 mg by mouth 2 (two) times daily as needed (constipation).    [provider]  lisinopril-hydrochlorothiazide (PRINZIDE,ZESTORETIC) 20-25 MG tablet Take 2 tablets by mouth.  11/02/18   [provider]  oxyCODONE-acetaminophen (PERCOCET/ROXICET) 5-325 MG tablet Take 1 tablet by mouth every 6 (six) hours as needed. 04/25/19   Dam Ashraf, Dellis Filbert, PA-C  traMADol (ULTRAM) 50 MG tablet Take 1 tablet (50 mg total) by mouth every 6 (six) hours as needed. Patient not taking: Reported on 02/03/2019 12/08/18   Nickel, Sharmon Leyden, NP    Family History Family History  Problem Relation Age of Onset  . Diabetes Father     Social History Social History   Tobacco Use  . Smoking status: Current Every Day Smoker    Packs/day: 1.00    Types: Cigarettes  . Smokeless tobacco: Never Used  Substance Use Topics  . Alcohol use: Never    Frequency: Never  . Drug use: Never     Allergies   Patient has no known allergies.   Review of Systems Review of Systems  All other systems reviewed and are negative.    Physical Exam Updated Vital Signs BP (!) 165/111 (BP Location: Right Arm)   Pulse 87   Temp 98.2 F (36.8 C) (Oral)   Resp 16  SpO2 100%   Physical Exam Vitals signs and nursing note reviewed.  Constitutional:      Appearance: He is well-developed.  HENT:     Head: Normocephalic and atraumatic.  Eyes:     General: No scleral icterus.       Right eye: No discharge.        Left eye: No discharge.     Conjunctiva/sclera: Conjunctivae normal.     Pupils: Pupils are equal, round, and reactive to light.  Neck:     Musculoskeletal: Normal range of motion.     Vascular: No JVD.     Trachea: No tracheal deviation.  Pulmonary:     Effort: Pulmonary effort is normal.     Breath sounds: No stridor.  Musculoskeletal:     Comments: Dopplerable pulses to the left posterior tibialis-no pulse  noted to the dorsalis pedis, extremities warm and well-perfused with cap refill intact  Neurological:     Mental Status: He is alert and oriented to person, place, and time.     Coordination: Coordination normal.  Psychiatric:        Behavior: Behavior normal.        Thought Content: Thought content normal.        Judgment: Judgment normal.      ED Treatments / Results  Labs (all labs ordered are listed, but only abnormal results are displayed) Labs Reviewed - No data to display  EKG None  Radiology No results found.  Procedures Procedures (including critical care time)  Medications Ordered in ED Medications  oxyCODONE-acetaminophen (PERCOCET/ROXICET) 5-325 MG per tablet 1 tablet (1 tablet Oral Given 04/25/19 1318)     Initial Impression / Assessment and Plan / ED Course  I have reviewed the triage vital signs and the nursing notes.  Pertinent labs & imaging results that were available during my care of the patient were reviewed by me and considered in my medical decision making (see chart for details).        48 year old male presents today with ongoing pain to his left lower extremity.  This is nonacute and has been present since his last visit.  He does have a warm well perfused extremity with Doppler signal.  I do not see any signs of acute limb ischemia.  Patient will be given a dose of pain medicine and discharged with a short course of pain medication.  I have encouraged him to continue outpatient follow-up with vascular surgery if he develops any new or worsening signs or symptoms he is instructed to return the emergency room immediately.  He verbalized understanding and agreement to today's plan had no further questions or concerns at the time of discharge.  Final Clinical Impressions(s) / ED Diagnoses   Final diagnoses:  Pain of left lower extremity    ED Discharge Orders         Ordered    oxyCODONE-acetaminophen (PERCOCET/ROXICET) 5-325 MG tablet  Every 6  hours PRN     04/25/19 1236           Eyvonne MechanicHedges, Abdallah Hern, PA-C 04/25/19 1841    Tegeler, Canary Brimhristopher J, MD 04/26/19 [phone]

## 2019-04-25 NOTE — ED Notes (Signed)
Patient verbalizes understanding of discharge instructions. Opportunity for questioning and answers were provided. Armband removed by staff, pt discharged from ED by self  

## 2019-04-25 NOTE — ED Triage Notes (Signed)
Patient complains of ongoing left ankle and foot pain since having DVT in February. Denies any trauma, no swelling noted, NAD

## 2019-04-25 NOTE — Discharge Instructions (Addendum)
If you experience any new or worsening signs or symptoms please return to the emergency room for evaluation. Please follow-up with your primary care provider or specialist as discussed. Please use medication prescribed only as directed and discontinue taking if you have any concerning signs or symptoms.

## 2019-04-27 ENCOUNTER — Encounter (HOSPITAL_COMMUNITY)
Admission: EM | Disposition: A | Discharge status: Home / Self Care | Payer: Self-pay | Source: Home / Self Care | Attending: Vascular Surgery

## 2019-04-27 ENCOUNTER — Emergency Department (HOSPITAL_COMMUNITY): Payer: BLUE CROSS/BLUE SHIELD

## 2019-04-27 ENCOUNTER — Inpatient Hospital Stay (HOSPITAL_COMMUNITY)
Admission: EM | Admit: 2019-04-27 | Discharge: 2019-04-29 | DRG: 254 | Disposition: A | Discharge status: Home / Self Care | Payer: BLUE CROSS/BLUE SHIELD | Attending: Surgery | Admitting: Surgery

## 2019-04-27 ENCOUNTER — Encounter (HOSPITAL_COMMUNITY): Payer: Self-pay | Admitting: Certified Registered"

## 2019-04-27 ENCOUNTER — Other Ambulatory Visit: Payer: Self-pay

## 2019-04-27 DIAGNOSIS — I10 Essential (primary) hypertension: Secondary | ICD-10-CM | POA: Diagnosis present

## 2019-04-27 DIAGNOSIS — Z20828 Contact with and (suspected) exposure to other viral communicable diseases: Secondary | ICD-10-CM | POA: Diagnosis present

## 2019-04-27 DIAGNOSIS — Z79891 Long term (current) use of opiate analgesic: Secondary | ICD-10-CM

## 2019-04-27 DIAGNOSIS — Z79899 Other long term (current) drug therapy: Secondary | ICD-10-CM

## 2019-04-27 DIAGNOSIS — Z833 Family history of diabetes mellitus: Secondary | ICD-10-CM

## 2019-04-27 DIAGNOSIS — I70262 Atherosclerosis of native arteries of extremities with gangrene, left leg: Secondary | ICD-10-CM | POA: Diagnosis not present

## 2019-04-27 DIAGNOSIS — I998 Other disorder of circulatory system: Secondary | ICD-10-CM | POA: Diagnosis present

## 2019-04-27 DIAGNOSIS — I739 Peripheral vascular disease, unspecified: Principal | ICD-10-CM | POA: Diagnosis present

## 2019-04-27 DIAGNOSIS — Z7982 Long term (current) use of aspirin: Secondary | ICD-10-CM

## 2019-04-27 DIAGNOSIS — F17210 Nicotine dependence, cigarettes, uncomplicated: Secondary | ICD-10-CM | POA: Diagnosis present

## 2019-04-27 HISTORY — PX: ANGIOPLASTY: SHX39

## 2019-04-27 HISTORY — PX: LOWER EXTREMITY ANGIOGRAM: SHX5508

## 2019-04-27 LAB — COMPREHENSIVE METABOLIC PANEL
ALT: 24 U/L (ref 0–44)
AST: 21 U/L (ref 15–41)
Albumin: 4.2 g/dL (ref 3.5–5.0)
Alkaline Phosphatase: 71 U/L (ref 38–126)
Anion gap: 11 (ref 5–15)
BUN: 10 mg/dL (ref 6–20)
CO2: 24 mmol/L (ref 22–32)
Calcium: 9.6 mg/dL (ref 8.9–10.3)
Chloride: 104 mmol/L (ref 98–111)
Creatinine, Ser: 1.11 mg/dL (ref 0.61–1.24)
GFR calc Af Amer: >60 mL/min (ref >60)
GFR calc non Af Amer: >60 mL/min (ref >60)
Glucose, Bld: 102 mg/dL — ABNORMAL HIGH (ref 70–99)
Potassium: 3.8 mmol/L (ref 3.5–5.1)
Sodium: 139 mmol/L (ref 135–145)
Total Bilirubin: 0.4 mg/dL (ref 0.3–1.2)
Total Protein: 7 g/dL (ref 6.5–8.1)

## 2019-04-27 LAB — CBC WITH DIFFERENTIAL/PLATELET
Abs Immature Granulocytes: 0.04 10*3/uL (ref 0.00–0.07)
Basophils Absolute: 0.1 10*3/uL (ref 0.0–0.1)
Basophils Relative: 1 %
Eosinophils Absolute: 0.3 10*3/uL (ref 0.0–0.5)
Eosinophils Relative: 3 %
HCT: 51.4 % (ref 39.0–52.0)
Hemoglobin: 16.5 g/dL (ref 13.0–17.0)
Immature Granulocytes: 0 %
Lymphocytes Relative: 43 %
Lymphs Abs: 4.6 10*3/uL — ABNORMAL HIGH (ref 0.7–4.0)
MCH: 26.1 pg (ref 26.0–34.0)
MCHC: 32.1 g/dL (ref 30.0–36.0)
MCV: 81.2 fL (ref 80.0–100.0)
Monocytes Absolute: 0.7 10*3/uL (ref 0.1–1.0)
Monocytes Relative: 6 %
Neutro Abs: 5 10*3/uL (ref 1.7–7.7)
Neutrophils Relative %: 47 %
Platelets: 286 10*3/uL (ref 150–400)
RBC: 6.33 MIL/uL — ABNORMAL HIGH (ref 4.22–5.81)
RDW: 16.3 % — ABNORMAL HIGH (ref 11.5–15.5)
WBC: 10.8 10*3/uL — ABNORMAL HIGH (ref 4.0–10.5)
nRBC: 0 % (ref 0.0–0.2)

## 2019-04-27 LAB — SARS CORONAVIRUS 2 BY RT PCR (HOSPITAL ORDER, PERFORMED IN ~~LOC~~ HOSPITAL LAB): SARS Coronavirus 2: NEGATIVE

## 2019-04-27 SURGERY — ANGIOGRAM, LOWER EXTREMITY
Anesthesia: Monitor Anesthesia Care

## 2019-04-27 MED ORDER — MIDAZOLAM HCL 2 MG/2ML IJ SOLN
INTRAMUSCULAR | Status: AC
  Filled 2019-04-27: qty 2

## 2019-04-27 MED ORDER — MIDAZOLAM HCL (PF) 10 MG/2ML IJ SOLN
INTRAMUSCULAR | Status: AC
  Filled 2019-04-27: qty 2

## 2019-04-27 MED ORDER — HEPARIN BOLUS VIA INFUSION
6000.0000 [IU] | Freq: Once | INTRAVENOUS | Status: AC
  Administered 2019-04-27: 6000 [IU] via INTRAVENOUS
  Filled 2019-04-27: qty 6000

## 2019-04-27 MED ORDER — HEPARIN (PORCINE) 25000 UT/250ML-% IV SOLN
1900.0000 [IU]/h | INTRAVENOUS | Status: DC
  Administered 2019-04-27: 1900 [IU]/h via INTRAVENOUS
  Filled 2019-04-27: qty 250

## 2019-04-27 MED ORDER — LIDOCAINE HCL (PF) 1 % IJ SOLN
INTRAMUSCULAR | Status: AC
  Filled 2019-04-27: qty 30

## 2019-04-27 MED ORDER — SODIUM CHLORIDE 0.9 % IV SOLN
INTRAVENOUS | Status: AC
  Filled 2019-04-27: qty 1.2

## 2019-04-27 MED ORDER — PROPOFOL 10 MG/ML IV BOLUS
INTRAVENOUS | Status: AC
  Filled 2019-04-27: qty 20

## 2019-04-27 MED ORDER — HEPARIN SODIUM (PORCINE) 5000 UNIT/ML IJ SOLN: 4000.0000 [IU] | Freq: Once | INTRAMUSCULAR | Status: DC

## 2019-04-27 MED ORDER — FENTANYL CITRATE (PF) 250 MCG/5ML IJ SOLN
INTRAMUSCULAR | Status: AC
  Filled 2019-04-27: qty 5

## 2019-04-27 SURGICAL SUPPLY — 74 items
BAG SNAP BAND KOVER 36X36 (MISCELLANEOUS) ×4 IMPLANT
BALLN STERLING OTW 3X150X150 (BALLOONS) ×4
BALLOON STERLING OTW 3X150X150 (BALLOONS) ×3 IMPLANT
BLADE SURG 11 STRL SS (BLADE) ×4 IMPLANT
CANISTER SUCT 3000ML PPV (MISCELLANEOUS) ×4 IMPLANT
CATH ANGIO 5F BER2 100CM (CATHETERS) ×4 IMPLANT
CATH ANGIO 5F BER2 65CM (CATHETERS) IMPLANT
CATH CXI SUPP ANG 2.6FR 150CM (CATHETERS) ×4 IMPLANT
CATH INFUS 135CMX50CM (CATHETERS) ×4 IMPLANT
CATH OMNI FLUSH .035X70CM (CATHETERS) IMPLANT
CATH OMNI FLUSH 5F 65CM (CATHETERS) ×4 IMPLANT
CHLORAPREP W/TINT 26 (MISCELLANEOUS) IMPLANT
CLIP VESOCCLUDE MED 24/CT (CLIP) IMPLANT
CLIP VESOCCLUDE SM WIDE 24/CT (CLIP) IMPLANT
COVER DOME SNAP 22 D (MISCELLANEOUS) ×4 IMPLANT
COVER PROBE W GEL 5X96 (DRAPES) IMPLANT
COVER SURGICAL LIGHT HANDLE (MISCELLANEOUS) ×4 IMPLANT
COVER WAND RF STERILE (DRAPES) IMPLANT
DERMABOND ADVANCED (GAUZE/BANDAGES/DRESSINGS) ×1
DERMABOND ADVANCED .7 DNX12 (GAUZE/BANDAGES/DRESSINGS) ×3 IMPLANT
DEVICE TORQUE KENDALL .025-038 (MISCELLANEOUS) IMPLANT
DRAIN CHANNEL 15F RND FF W/TCR (WOUND CARE) IMPLANT
DRAPE FEMORAL ANGIO 80X135IN (DRAPES) IMPLANT
DRSG TEGADERM 4X4.75 (GAUZE/BANDAGES/DRESSINGS) ×4 IMPLANT
ELECT REM PT RETURN 9FT ADLT (ELECTROSURGICAL) ×4
ELECTRODE REM PT RTRN 9FT ADLT (ELECTROSURGICAL) ×3 IMPLANT
EVACUATOR SILICONE 100CC (DRAIN) IMPLANT
GAUZE 4X4 16PLY RFD (DISPOSABLE) IMPLANT
GLIDEWIRE ADV .035X260CM (WIRE) ×4 IMPLANT
GLOVE BIO SURGEON STRL SZ7.5 (GLOVE) ×4 IMPLANT
GLOVE BIOGEL PI IND STRL 7.5 (GLOVE) ×12 IMPLANT
GLOVE BIOGEL PI INDICATOR 7.5 (GLOVE) ×4
GLOVE SURG SS PI 7.5 STRL IVOR (GLOVE) ×8 IMPLANT
GOWN STRL REUS W/ TWL LRG LVL3 (GOWN DISPOSABLE) ×6 IMPLANT
GOWN STRL REUS W/ TWL XL LVL3 (GOWN DISPOSABLE) ×3 IMPLANT
GOWN STRL REUS W/TWL LRG LVL3 (GOWN DISPOSABLE) ×2
GOWN STRL REUS W/TWL XL LVL3 (GOWN DISPOSABLE) ×1
GUIDEWIRE ANGLED .035X150CM (WIRE) IMPLANT
HEMOSTAT SNOW SURGICEL 2X4 (HEMOSTASIS) IMPLANT
KIT BASIN OR (CUSTOM PROCEDURE TRAY) ×4 IMPLANT
KIT ENCORE 26 ADVANTAGE (KITS) ×4 IMPLANT
KIT TURNOVER KIT B (KITS) ×4 IMPLANT
NEEDLE PERC 18GX7CM (NEEDLE) ×4 IMPLANT
NS IRRIG 1000ML POUR BTL (IV SOLUTION) ×8 IMPLANT
PACK ENDO MINOR (CUSTOM PROCEDURE TRAY) ×4 IMPLANT
PACK PERIPHERAL VASCULAR (CUSTOM PROCEDURE TRAY) IMPLANT
PACK SURGICAL SETUP 50X90 (CUSTOM PROCEDURE TRAY) IMPLANT
PACK UNIVERSAL I (CUSTOM PROCEDURE TRAY) ×4 IMPLANT
PAD ARMBOARD 7.5X6 YLW CONV (MISCELLANEOUS) ×8 IMPLANT
PROTECTION STATION PRESSURIZED (MISCELLANEOUS)
SET MICROPUNCTURE 5F STIFF (MISCELLANEOUS) ×4 IMPLANT
SHEATH AVANTI 11CM 5FR (SHEATH) IMPLANT
SHEATH PINNACLE 5F 10CM (SHEATH) ×4 IMPLANT
SHEATH PINNACLE ST 6F 45CM (SHEATH) ×4 IMPLANT
STATION PROTECTION PRESSURIZED (MISCELLANEOUS) IMPLANT
STOPCOCK MORSE 400PSI 3WAY (MISCELLANEOUS) ×4 IMPLANT
SUT MNCRL AB 4-0 PS2 18 (SUTURE) IMPLANT
SUT PROLENE 5 0 C 1 24 (SUTURE) IMPLANT
SUT PROLENE 6 0 BV (SUTURE) IMPLANT
SUT VIC AB 2-0 CT1 27 (SUTURE)
SUT VIC AB 2-0 CT1 TAPERPNT 27 (SUTURE) IMPLANT
SUT VIC AB 3-0 SH 27 (SUTURE)
SUT VIC AB 3-0 SH 27X BRD (SUTURE) IMPLANT
SYR 10ML LL (SYRINGE) ×16 IMPLANT
SYR 20ML LL LF (SYRINGE) ×4 IMPLANT
SYR 30ML LL (SYRINGE) ×4 IMPLANT
SYR MEDRAD MARK V 150ML (SYRINGE) IMPLANT
TOWEL GREEN STERILE (TOWEL DISPOSABLE) ×8 IMPLANT
TRAY FOLEY MTR SLVR 16FR STAT (SET/KITS/TRAYS/PACK) IMPLANT
TUBING HIGH PRESSURE 120CM (CONNECTOR) IMPLANT
UNDERPAD 30X30 (UNDERPADS AND DIAPERS) ×4 IMPLANT
WATER STERILE IRR 1000ML POUR (IV SOLUTION) ×4 IMPLANT
WIRE BENTSON .035X145CM (WIRE) IMPLANT
WIRE G V18X300CM (WIRE) ×4 IMPLANT

## 2019-04-27 NOTE — ED Notes (Signed)
OR consent signed by pt after Dr. Donzetta Matters explained procedure.  No questions asked

## 2019-04-27 NOTE — ED Provider Notes (Addendum)
[place] EMERGENCY DEPARTMENT Provider Note   CSN: [id number] Arrival date & time: 04/27/19  1810     History   Chief Complaint Chief Complaint  Patient presents with  . Leg Pain    HPI Sean [name] is a 48 y.o. male. Patient presents for left leg pain that has been constant since that arterial bypass surgery that was done February.  Patient states the pain is worse the past 2 days and pain is worse when his leg is hanging down or when he walks for more than 30 minutes.  Patient states he took hydrocodone at 5:30 PM and 30 minutes later the pain returned as bad as before.     HPI  Past Medical History:  Diagnosis Date  . Hypertension   . PVD (peripheral vascular disease) [facility])     Patient Active Problem List   Diagnosis Date Noted  . Ischemia of extremity 09/21/2018  . PVD (peripheral vascular disease) ([place]) 09/20/2018    Past Surgical History:  Procedure Laterality Date  . EMBOLECTOMY Left 09/21/2018   Procedure: THROMBECTOMY LEFT POPLITEAL ARTERY BYPASS LEFT POPLITEAL TO TIBIAL ARTERY WITH SAPHENOUS VEIN HARVEST - FASCIOTOMY;  Surgeon: Angelia Mould, MD;  Location: [place];  Service: Vascular;  Laterality: Left;        Home Medications    Prior to Admission medications   Medication Sig Start Date End Date Taking? Authorizing Provider  amLODipine (NORVASC) 10 MG tablet Take 10 mg by mouth daily. 11/23/18   [provider]  aspirin EC 81 MG tablet Take 81 mg by mouth daily.    [provider]  docusate sodium (COLACE) 100 MG capsule Take 100 mg by mouth 2 (two) times daily as needed (constipation).    [provider]  lisinopril-hydrochlorothiazide (PRINZIDE,ZESTORETIC) 20-25 MG tablet Take 2 tablets by mouth.  11/02/18   [provider]  oxyCODONE-acetaminophen (PERCOCET/ROXICET) 5-325 MG tablet Take 1 tablet by mouth every 6 (six) hours as needed. 04/25/19   Hedges, Dellis Filbert, PA-C  traMADol (ULTRAM) 50  MG tablet Take 1 tablet (50 mg total) by mouth every 6 (six) hours as needed. Patient not taking: Reported on 02/03/2019 12/08/18   Nickel, Sharmon Leyden, NP    Family History Family History  Problem Relation Age of Onset  . Diabetes Father     Social History Social History   Tobacco Use  . Smoking status: Current Every Day Smoker    Packs/day: 1.00    Types: Cigarettes  . Smokeless tobacco: Never Used  Substance Use Topics  . Alcohol use: Never    Frequency: Never  . Drug use: Never     Allergies   Patient has no known allergies.   Review of Systems Review of Systems  Constitutional: Negative for chills and fever.  HENT: Negative for congestion, sinus pressure and sinus pain.   Eyes: Negative for pain.  Respiratory: Negative for cough and shortness of breath.   Cardiovascular: Negative for chest pain and leg swelling.  Gastrointestinal: Negative for abdominal pain and vomiting.  Genitourinary: Negative for dysuria.  Musculoskeletal:       Left leg pain  Skin: Negative for rash.  Neurological: Negative for dizziness and headaches.     Physical Exam Updated Vital Signs BP (!) 158/117   Pulse (!) 102   Temp 98.3 F (36.8 C) (Oral)   Resp 20   Ht 6\' 5"  (1.956 m)   Wt 129.3 kg   SpO2 100%   BMI 33.80  kg/m   Physical Exam Vitals signs and nursing note reviewed.  Constitutional:      General: He is not in acute distress. HENT:     Head: Normocephalic and atraumatic.     Nose: Nose normal.     Mouth/Throat:     Mouth: Mucous membranes are moist.  Eyes:     General: No scleral icterus. Neck:     Musculoskeletal: Normal range of motion.  Cardiovascular:     Rate and Rhythm: Normal rate and regular rhythm.     Pulses:          Dorsalis pedis pulses are 2+ on the right side and 0 on the left side.       Posterior tibial pulses are 2+ on the right side and 0 on the left side.     Heart sounds: No murmur. No friction rub. No gallop.   Pulmonary:     Effort:  Pulmonary effort is normal. No respiratory distress.     Breath sounds: No wheezing, rhonchi or rales.  Abdominal:     Palpations: Abdomen is soft.     Tenderness: There is no abdominal tenderness.  Musculoskeletal:     Right lower leg: No edema.     Left lower leg: No edema.  Feet:     Comments: Unable to detect left DP and PT pulses using Doppler Skin:    General: Skin is dry.     Comments: Cap refill greater than 3 seconds left toes.  Fully healed surgical scars on the left calf.  Neurological:     Mental Status: He is alert. Mental status is at baseline.  Psychiatric:        Mood and Affect: Mood normal.        Behavior: Behavior normal.      ED Treatments / Results  Labs (all labs ordered are listed, but only abnormal results are displayed) Labs Reviewed - No data to display  EKG None  Radiology No results found.  Procedures Procedures (including critical care time)  Medications Ordered in ED Medications - No data to display   Initial Impression / Assessment and Plan / ED Course  I have reviewed the triage vital signs and the nursing notes.  Pertinent labs & imaging results that were available during my care of the patient were reviewed by me and considered in my medical decision making (see chart for details).  Presents for left leg pain.  Patient is a patient of Dr. Randie Heinz vascular surgeon who he had appointment to the ED today.  Patient has no pulses in the left lower extremity palpable or with Doppler.  Call made to Dr. Randie Heinz by Manuela Neptune.  Labs, EKG, chest x-ray done for preoperative/admission.  Dr. Randie Heinz states he is taking patient to OR. Requested labs are ordered.    Clinical Course as of Apr 26 2132  Thu Apr 27, 2019  2133 SARS Coronavirus 2 [facility] order, Performed in [facility] hospital lab) Nasopharyngeal Nasopharyngeal Swab [WF]    Clinical Course User Index [WF] Gailen Shelter, [place]   CRITICAL CARE Performed by: Gailen Shelter   Total  critical care time: 35 minutes  Critical care time was exclusive of separately billable procedures and treating other patients.  Critical care was necessary to treat or prevent imminent or life-threatening deterioration.  Critical care was time spent personally by me on the following activities: development of treatment plan with patient and/or surrogate as well as nursing, discussions with consultants,  evaluation of patient's response to treatment, examination of patient, obtaining history from patient or surrogate, ordering and performing treatments and interventions, ordering and review of laboratory studies, ordering and review of radiographic studies, pulse oximetry and re-evaluation of patient's condition.      Final Clinical Impressions(s) / ED Diagnoses   Final diagnoses:  None    ED Discharge Orders    None       Gailen ShelterFondaw, Salley Boxley S, [place] 04/27/19 2301    Arby BarrettePfeiffer, Marcy, MD 04/29/19 1751    Solon AugustaFondaw, Makynna Manocchio RayleS, PA 05/13/19 [phone]    Arby BarrettePfeiffer, Marcy, MD 05/21/19 1651

## 2019-04-27 NOTE — ED Notes (Signed)
Dr. Donzetta Matters in to assess pt.

## 2019-04-27 NOTE — Progress Notes (Addendum)
ANTICOAGULATION CONSULT NOTE  Pharmacy Consult for heparin Indication: arterial occlusion  Heparin Dosing Weight: 116.7 kg  Labs: Recent Labs    04/27/19 2140  HGB 16.5  HCT 51.4  PLT 286  CREATININE 1.11    Assessment: 42 yom presenting with leg pain constant since arterial bypass surgery in 09/2018. Pharmacy consulted to dose heparin for arterial occlusion. Not on anticoagulation PTA. Vascular Surgery planning intervention scheduled for tonight. CBC pending. No active bleed issues documented.  Goal of Therapy:  Heparin level 0.3-0.7 units/ml Monitor platelets by anticoagulation protocol: Yes   Plan:  Heparin 6000 unit bolus Start heparin at 1900 units/h 6h heparin level Daily heparin level/CBC Monitor s/sx bleeding F/u plans post-Vascular Surgery intervention scheduled for 9/3 PM  Elicia Lamp, PharmD, BCPS Clinical Pharmacist 04/27/2019 10:22 PM

## 2019-04-27 NOTE — ED Triage Notes (Signed)
Pt presents with ongoing Left ankle, foot and leg pain since surgery for a DVT in February. Seen here Tuesday for the same dc'd home to follow up with Dr. Donzetta Matters. Spouse called Dr. Donzetta Matters tyoday he advised him to come to ED as he is on call at [facility] today.

## 2019-04-27 NOTE — H&P (Signed)
HP  History of Present Illness: This is a 48 y.o. male in January and went right above-knee popliteal artery bypass 2 and posterior tibial artery for acute on chronic left lower extremity ischemia.  He also had 4 compartment fasciotomies.  Wounds have subsequently healed.  Has been maintained on aspirin.  Continues to smoke daily.  States 2 weeks ago began having worsened pain left foot.  Was seen in the ER few days ago was noted of signals in the posterior tibial artery.  Now states pain is so bad he cannot get out of bed.  Has been taking Percocet with no help.  States he can still feel his foot but that it feels weak cannot move his left fourth and fifth toe at this time.  Past Medical History:  Diagnosis Date  . Hypertension   . PVD (peripheral vascular disease) ([place])     Past Surgical History:  Procedure Laterality Date  . EMBOLECTOMY Left 09/21/2018   Procedure: THROMBECTOMY LEFT POPLITEAL ARTERY BYPASS LEFT POPLITEAL TO TIBIAL ARTERY WITH SAPHENOUS VEIN HARVEST - FASCIOTOMY;  Surgeon: Angelia Mould, MD;  Location: [place];  Service: Vascular;  Laterality: Left;    No Known Allergies  Prior to Admission medications   Medication Sig Start Date End Date Taking? Authorizing Provider  amLODipine (NORVASC) 10 MG tablet Take 10 mg by mouth daily. 11/23/18  Yes [provider]  aspirin EC 81 MG tablet Take 81 mg by mouth daily.   Yes [provider]  lisinopril-hydrochlorothiazide (PRINZIDE,ZESTORETIC) 20-25 MG tablet Take 2 tablets by mouth daily.  11/02/18  Yes [provider]  oxyCODONE-acetaminophen (PERCOCET/ROXICET) 5-325 MG tablet Take 1 tablet by mouth every 6 (six) hours as needed. Patient taking differently: Take 1 tablet by mouth every 6 (six) hours as needed for moderate pain.  04/25/19  Yes Hedges, Dellis Filbert, PA-C    Social History   Socioeconomic History  . Marital status: Married    Spouse name: Not on file  . Number of children: Not on file   . Years of education: Not on file  . Highest education level: Not on file  Occupational History  . Not on file  Social Needs  . Financial resource strain: Not on file  . Food insecurity    Worry: Not on file    Inability: Not on file  . Transportation needs    Medical: Not on file    Non-medical: Not on file  Tobacco Use  . Smoking status: Current Every Day Smoker    Packs/day: 1.00    Types: Cigarettes  . Smokeless tobacco: Never Used  Substance and Sexual Activity  . Alcohol use: Never    Frequency: Never  . Drug use: Never  . Sexual activity: Not on file  Lifestyle  . Physical activity    Days per week: Not on file    Minutes per session: Not on file  . Stress: Not on file  Relationships  . Social Herbalist on phone: Not on file    Gets together: Not on file    Attends religious service: Not on file    Active member of club or organization: Not on file    Attends meetings of clubs or organizations: Not on file    Relationship status: Not on file  . Intimate partner violence    Fear of current or ex partner: Not on file    Emotionally abused: Not on file    Physically abused: Not on  file    Forced sexual activity: Not on file  Other Topics Concern  . Not on file  Social History Narrative  . Not on file     Family History  Problem Relation Age of Onset  . Diabetes Father     ROS: Left foot and leg pain as above  Physical Examination  Vitals:   04/27/19 1816  BP: (!) 158/117  Pulse: (!) 102  Resp: 20  Temp: 98.3 F (36.8 C)  SpO2: 100%   Body mass index is 33.8 kg/m.  General: No acute distress HENT: WNL, normocephalic Pulmonary: normal non-labored breathing Cardiac bilateral common femoral pulses are palpable There are no PT DP or peroneal signals on the left side Abdomen: soft, NT/ND, no masses Extremities: Left foot is significantly cooler to touch than right.  There are no palpable pulses below the femoral on the left  Neurologic: He is awake alert and oriented Sensation the left foot is intact  CBC    Component Value Date/Time   WBC 10.8 (H) 04/27/2019 2140   RBC 6.33 (H) 04/27/2019 2140   HGB 16.5 04/27/2019 2140   HCT 51.4 04/27/2019 2140   PLT 286 04/27/2019 2140   MCV 81.2 04/27/2019 2140   MCH 26.1 04/27/2019 2140   MCHC 32.1 04/27/2019 2140   RDW 16.3 (H) 04/27/2019 2140   LYMPHSABS PENDING 04/27/2019 2140   MONOABS PENDING 04/27/2019 2140   EOSABS PENDING 04/27/2019 2140   BASOSABS PENDING 04/27/2019 2140    BMET    Component Value Date/Time   NA 139 04/27/2019 2140   K 3.8 04/27/2019 2140   CL 104 04/27/2019 2140   CO2 24 04/27/2019 2140   GLUCOSE 102 (H) 04/27/2019 2140   BUN 10 04/27/2019 2140   CREATININE 1.11 04/27/2019 2140   CALCIUM 9.6 04/27/2019 2140   GFRNONAA >60 04/27/2019 2140   GFRAA >60 04/27/2019 2140    COAGS: No results found for: INR, PROTIME   Non-Invasive Vascular Imaging:   No studies  ASSESSMENT/PLAN: This is a 48 y.o. male with previous left above-knee popliteal to posterior tibial artery bypass for what appeared to be at the time acute on chronic lower extremity ischemia and also had 4 compartment fasciotomies.  Recently has bypass demonstrated some decreased velocities but was patent with preserved signals at the foot.  Now has 2 weeks of pain and cold left lower extremity he does not have any signals in the left foot.  Will need to proceed urgently to the operating room tonight.  Heparin drip started in the ER.  We will plan left lower extremity angiogram possible lysis possible open intervention.  I discussed the risk benefits alternatives with the patient he demonstrates good understanding  Ellana Kawa C. Randie Heinzain, MD [facility] [place] Office: [phone] Pager: [phone]

## 2019-04-27 NOTE — ED Notes (Signed)
Pt to OR.

## 2019-04-28 ENCOUNTER — Emergency Department (HOSPITAL_COMMUNITY): Payer: BLUE CROSS/BLUE SHIELD | Admitting: Anesthesiology

## 2019-04-28 ENCOUNTER — Encounter (HOSPITAL_COMMUNITY)
Admission: EM | Disposition: A | Discharge status: Home / Self Care | Payer: Self-pay | Source: Home / Self Care | Attending: Vascular Surgery

## 2019-04-28 ENCOUNTER — Encounter (HOSPITAL_COMMUNITY): Payer: Self-pay | Admitting: Vascular Surgery

## 2019-04-28 DIAGNOSIS — Z7982 Long term (current) use of aspirin: Secondary | ICD-10-CM | POA: Diagnosis not present

## 2019-04-28 DIAGNOSIS — Z833 Family history of diabetes mellitus: Secondary | ICD-10-CM | POA: Diagnosis not present

## 2019-04-28 DIAGNOSIS — Z79899 Other long term (current) drug therapy: Secondary | ICD-10-CM | POA: Diagnosis not present

## 2019-04-28 DIAGNOSIS — Z79891 Long term (current) use of opiate analgesic: Secondary | ICD-10-CM | POA: Diagnosis not present

## 2019-04-28 DIAGNOSIS — I70262 Atherosclerosis of native arteries of extremities with gangrene, left leg: Secondary | ICD-10-CM | POA: Diagnosis not present

## 2019-04-28 DIAGNOSIS — F17210 Nicotine dependence, cigarettes, uncomplicated: Secondary | ICD-10-CM | POA: Diagnosis present

## 2019-04-28 DIAGNOSIS — I739 Peripheral vascular disease, unspecified: Secondary | ICD-10-CM

## 2019-04-28 DIAGNOSIS — I10 Essential (primary) hypertension: Secondary | ICD-10-CM | POA: Diagnosis present

## 2019-04-28 DIAGNOSIS — I998 Other disorder of circulatory system: Secondary | ICD-10-CM | POA: Diagnosis present

## 2019-04-28 DIAGNOSIS — Z20828 Contact with and (suspected) exposure to other viral communicable diseases: Secondary | ICD-10-CM | POA: Diagnosis present

## 2019-04-28 DIAGNOSIS — Z01810 Encounter for preprocedural cardiovascular examination: Secondary | ICD-10-CM | POA: Diagnosis not present

## 2019-04-28 HISTORY — PX: PERIPHERAL VASCULAR INTERVENTION: CATH118257

## 2019-04-28 HISTORY — DX: Peripheral vascular disease, unspecified: I73.9

## 2019-04-28 HISTORY — PX: PERIPHERAL VASCULAR THROMBECTOMY: CATH118306

## 2019-04-28 HISTORY — DX: Other disorder of circulatory system: I99.8

## 2019-04-28 LAB — CBC
HCT: 52.5 % — ABNORMAL HIGH (ref 39.0–52.0)
Hemoglobin: 17.5 g/dL — ABNORMAL HIGH (ref 13.0–17.0)
MCH: 26.6 pg (ref 26.0–34.0)
MCHC: 33.3 g/dL (ref 30.0–36.0)
MCV: 79.9 fL — ABNORMAL LOW (ref 80.0–100.0)
Platelets: 168 10*3/uL (ref 150–400)
RBC: 6.57 MIL/uL — ABNORMAL HIGH (ref 4.22–5.81)
RDW: 17.1 % — ABNORMAL HIGH (ref 11.5–15.5)
WBC: 11.6 10*3/uL — ABNORMAL HIGH (ref 4.0–10.5)
nRBC: 0.2 % (ref 0.0–0.2)

## 2019-04-28 LAB — SURGICAL PCR SCREEN
MRSA, PCR: NEGATIVE
Staphylococcus aureus: POSITIVE — AB

## 2019-04-28 LAB — HEPARIN LEVEL (UNFRACTIONATED): Heparin Unfractionated: 0.53 IU/mL (ref 0.30–0.70)

## 2019-04-28 LAB — FIBRINOGEN: Fibrinogen: 369 mg/dL (ref 210–475)

## 2019-04-28 SURGERY — PERIPHERAL VASCULAR THROMBECTOMY
Anesthesia: LOCAL | Laterality: Left

## 2019-04-28 MED ORDER — METOPROLOL TARTRATE 5 MG/5ML IV SOLN
5.0000 mg | Freq: Four times a day (QID) | INTRAVENOUS | Status: DC
  Administered 2019-04-28 – 2019-04-29 (×5): 5 mg via INTRAVENOUS
  Filled 2019-04-28 (×5): qty 5

## 2019-04-28 MED ORDER — IODIXANOL 320 MG/ML IV SOLN
INTRAVENOUS | Status: DC | PRN
  Administered 2019-04-28: 90 mL via INTRA_ARTERIAL

## 2019-04-28 MED ORDER — SODIUM CHLORIDE 0.9 % IV SOLN
0.5000 mg/h | INTRAVENOUS | Status: DC
  Administered 2019-04-28 (×2): 0.5 mg/h
  Filled 2019-04-28 (×3): qty 10

## 2019-04-28 MED ORDER — NITROGLYCERIN IN D5W 200-5 MCG/ML-% IV SOLN
INTRAVENOUS | Status: AC
  Filled 2019-04-28: qty 250

## 2019-04-28 MED ORDER — LIDOCAINE HCL (PF) 1 % IJ SOLN
INTRAMUSCULAR | Status: AC
  Filled 2019-04-28: qty 30

## 2019-04-28 MED ORDER — LACTATED RINGERS IV SOLN
INTRAVENOUS | Status: DC | PRN
  Administered 2019-04-27: via INTRAVENOUS

## 2019-04-28 MED ORDER — HYDROMORPHONE HCL 1 MG/ML IJ SOLN
0.5000 mg | INTRAMUSCULAR | Status: DC | PRN
  Administered 2019-04-28: 0.5 mg via INTRAVENOUS

## 2019-04-28 MED ORDER — HEPARIN (PORCINE) IN NACL 1000-0.9 UT/500ML-% IV SOLN
INTRAVENOUS | Status: DC | PRN
  Administered 2019-04-28: 500 mL

## 2019-04-28 MED ORDER — SODIUM CHLORIDE 0.9% FLUSH
3.0000 mL | Freq: Two times a day (BID) | INTRAVENOUS | Status: DC
  Administered 2019-04-29: 3 mL via INTRAVENOUS

## 2019-04-28 MED ORDER — MIDAZOLAM HCL 2 MG/2ML IJ SOLN
1.0000 mg | INTRAMUSCULAR | Status: DC | PRN
  Administered 2019-04-28 (×2): 1 mg via INTRAVENOUS
  Filled 2019-04-28 (×2): qty 2

## 2019-04-28 MED ORDER — SODIUM CHLORIDE 0.9% FLUSH: 3.0000 mL | INTRAVENOUS | Status: DC | PRN

## 2019-04-28 MED ORDER — SODIUM CHLORIDE (PF) 0.9 % IJ SOLN
INTRAVENOUS | Status: DC | PRN
  Administered 2019-04-28: 100 mL via INTRAMUSCULAR

## 2019-04-28 MED ORDER — FENTANYL CITRATE (PF) 100 MCG/2ML IJ SOLN
INTRAMUSCULAR | Status: AC
  Filled 2019-04-28: qty 2

## 2019-04-28 MED ORDER — LABETALOL HCL 5 MG/ML IV SOLN
INTRAVENOUS | Status: AC
  Filled 2019-04-28: qty 4

## 2019-04-28 MED ORDER — LABETALOL HCL 5 MG/ML IV SOLN
5.0000 mg | INTRAVENOUS | Status: AC | PRN
  Administered 2019-04-28 (×2): 5 mg via INTRAVENOUS

## 2019-04-28 MED ORDER — MORPHINE SULFATE (PF) 4 MG/ML IV SOLN
5.0000 mg | INTRAVENOUS | Status: DC | PRN
  Administered 2019-04-28 (×5): 5 mg via INTRAVENOUS
  Filled 2019-04-28 (×5): qty 2

## 2019-04-28 MED ORDER — NITROGLYCERIN 1 MG/10 ML FOR IR/CATH LAB
INTRA_ARTERIAL | Status: AC
  Filled 2019-04-28: qty 10

## 2019-04-28 MED ORDER — HYDRALAZINE HCL 20 MG/ML IJ SOLN
5.0000 mg | INTRAMUSCULAR | Status: DC | PRN
  Administered 2019-04-29: 5 mg via INTRAVENOUS
  Filled 2019-04-28: qty 1

## 2019-04-28 MED ORDER — CLOPIDOGREL BISULFATE 75 MG PO TABS
300.0000 mg | ORAL_TABLET | Freq: Once | ORAL | Status: AC
  Administered 2019-04-28: 16:00:00 300 mg via ORAL
  Filled 2019-04-28: qty 4

## 2019-04-28 MED ORDER — SODIUM CHLORIDE 0.9% FLUSH: 3.0000 mL | Freq: Two times a day (BID) | INTRAVENOUS | Status: DC

## 2019-04-28 MED ORDER — LIDOCAINE HCL 1 % IJ SOLN
INTRAMUSCULAR | Status: DC | PRN
  Administered 2019-04-28: 30 mL

## 2019-04-28 MED ORDER — SODIUM CHLORIDE 0.9 % IV SOLN
INTRAVENOUS | Status: AC
  Filled 2019-04-28: qty 1.2

## 2019-04-28 MED ORDER — MIDAZOLAM HCL 2 MG/2ML IJ SOLN
INTRAMUSCULAR | Status: DC | PRN
  Administered 2019-04-28: 1 mg via INTRAVENOUS

## 2019-04-28 MED ORDER — HEPARIN (PORCINE) IN NACL 1000-0.9 UT/500ML-% IV SOLN
INTRAVENOUS | Status: AC
  Filled 2019-04-28: qty 1000

## 2019-04-28 MED ORDER — CLOPIDOGREL BISULFATE 75 MG PO TABS
75.0000 mg | ORAL_TABLET | Freq: Every day | ORAL | Status: DC
  Administered 2019-04-29: 08:00:00 75 mg via ORAL
  Filled 2019-04-28: qty 1

## 2019-04-28 MED ORDER — NITROGLYCERIN 1 MG/10 ML FOR IR/CATH LAB
INTRA_ARTERIAL | Status: DC | PRN
  Administered 2019-04-28: 200 ug via INTRA_ARTERIAL
  Administered 2019-04-28: 100 ug via INTRA_ARTERIAL
  Administered 2019-04-28: 400 ug via INTRA_ARTERIAL

## 2019-04-28 MED ORDER — HYDRALAZINE HCL 20 MG/ML IJ SOLN
5.0000 mg | INTRAMUSCULAR | Status: AC | PRN
  Administered 2019-04-28 (×2): 5 mg via INTRAVENOUS
  Filled 2019-04-28: qty 1
  Filled 2019-04-28: qty 0.25

## 2019-04-28 MED ORDER — ASPIRIN EC 81 MG PO TBEC
81.0000 mg | DELAYED_RELEASE_TABLET | Freq: Every day | ORAL | Status: DC
  Administered 2019-04-29: 81 mg via ORAL
  Filled 2019-04-28 (×2): qty 1

## 2019-04-28 MED ORDER — ONDANSETRON HCL 4 MG/2ML IJ SOLN: 4.0000 mg | Freq: Four times a day (QID) | INTRAMUSCULAR | Status: DC | PRN

## 2019-04-28 MED ORDER — HEPARIN (PORCINE) 25000 UT/250ML-% IV SOLN
1000.0000 [IU]/h | INTRAVENOUS | Status: DC
  Administered 2019-04-28: 1000 [IU]/h via INTRA_ARTERIAL
  Filled 2019-04-28: qty 250

## 2019-04-28 MED ORDER — ONDANSETRON HCL 4 MG/2ML IJ SOLN
INTRAMUSCULAR | Status: DC | PRN
  Administered 2019-04-28: 4 mg via INTRAVENOUS

## 2019-04-28 MED ORDER — SODIUM CHLORIDE 0.9 % IV SOLN
INTRAVENOUS | Status: DC | PRN
  Administered 2019-04-28: 500 mL

## 2019-04-28 MED ORDER — AMLODIPINE BESYLATE 10 MG PO TABS
10.0000 mg | ORAL_TABLET | Freq: Every day | ORAL | Status: DC
  Administered 2019-04-29: 10 mg via ORAL
  Filled 2019-04-28 (×2): qty 1

## 2019-04-28 MED ORDER — HEPARIN SODIUM (PORCINE) 1000 UNIT/ML IJ SOLN
INTRAMUSCULAR | Status: DC | PRN
  Administered 2019-04-28: 6000 [IU] via INTRAVENOUS

## 2019-04-28 MED ORDER — LABETALOL HCL 5 MG/ML IV SOLN: 10.0000 mg | INTRAVENOUS | Status: DC | PRN

## 2019-04-28 MED ORDER — SODIUM CHLORIDE 0.9 % IV SOLN
INTRAVENOUS | Status: DC
  Administered 2019-04-28: 10:00:00 via INTRAVENOUS

## 2019-04-28 MED ORDER — FENTANYL CITRATE (PF) 250 MCG/5ML IJ SOLN
INTRAMUSCULAR | Status: DC | PRN
  Administered 2019-04-28 (×3): 50 ug via INTRAVENOUS

## 2019-04-28 MED ORDER — SODIUM CHLORIDE 0.9 % IV SOLN: 250.0000 mL | INTRAVENOUS | Status: DC | PRN

## 2019-04-28 MED ORDER — CHLORHEXIDINE GLUCONATE CLOTH 2 % EX PADS
6.0000 | MEDICATED_PAD | Freq: Every day | CUTANEOUS | Status: DC
  Administered 2019-04-28 – 2019-04-29 (×2): 6 via TOPICAL

## 2019-04-28 MED ORDER — HEPARIN (PORCINE) 25000 UT/250ML-% IV SOLN
1300.0000 [IU]/h | INTRAVENOUS | Status: DC
  Administered 2019-04-28: 23:00:00 1000 [IU]/h via INTRAVENOUS
  Filled 2019-04-28: qty 250

## 2019-04-28 MED ORDER — HYDROMORPHONE HCL 1 MG/ML IJ SOLN
0.5000 mg | INTRAMUSCULAR | Status: DC | PRN
  Administered 2019-04-28 (×2): 0.5 mg via INTRAVENOUS
  Filled 2019-04-28: qty 1

## 2019-04-28 MED ORDER — HYDRALAZINE HCL 20 MG/ML IJ SOLN
INTRAMUSCULAR | Status: AC
  Filled 2019-04-28: qty 1

## 2019-04-28 MED ORDER — HYDROMORPHONE HCL 1 MG/ML IJ SOLN
INTRAMUSCULAR | Status: AC
  Filled 2019-04-28: qty 1

## 2019-04-28 MED ORDER — HEPARIN SODIUM (PORCINE) 1000 UNIT/ML IJ SOLN
INTRAMUSCULAR | Status: AC
  Filled 2019-04-28: qty 1

## 2019-04-28 MED ORDER — DEXMEDETOMIDINE HCL 200 MCG/2ML IV SOLN
INTRAVENOUS | Status: DC | PRN
  Administered 2019-04-28: 8 ug via INTRAVENOUS
  Administered 2019-04-28 (×2): 12 ug via INTRAVENOUS
  Administered 2019-04-28 (×3): 8 ug via INTRAVENOUS

## 2019-04-28 MED ORDER — MUPIROCIN 2 % EX OINT
TOPICAL_OINTMENT | Freq: Two times a day (BID) | CUTANEOUS | Status: DC
  Administered 2019-04-28 – 2019-04-29 (×3): via NASAL
  Filled 2019-04-28 (×3): qty 22

## 2019-04-28 MED ORDER — LABETALOL HCL 5 MG/ML IV SOLN
5.0000 mg | Freq: Once | INTRAVENOUS | Status: AC
  Administered 2019-04-28: 03:00:00 5 mg via INTRAVENOUS

## 2019-04-28 MED ORDER — ACETAMINOPHEN 325 MG PO TABS
650.0000 mg | ORAL_TABLET | ORAL | Status: DC | PRN
  Administered 2019-04-28: 650 mg via ORAL
  Filled 2019-04-28: qty 2

## 2019-04-28 MED ORDER — SODIUM CHLORIDE 0.9 % IV SOLN: INTRAVENOUS | Status: AC

## 2019-04-28 MED ORDER — METOPROLOL TARTRATE 5 MG/5ML IV SOLN: 2.0000 mg | INTRAVENOUS | Status: DC | PRN

## 2019-04-28 MED ORDER — FENTANYL CITRATE (PF) 100 MCG/2ML IJ SOLN
25.0000 ug | INTRAMUSCULAR | Status: DC | PRN
  Administered 2019-04-28 (×3): 50 ug via INTRAVENOUS

## 2019-04-28 SURGICAL SUPPLY — 13 items
BALLN STERLI SL OTW 2.5X80X150 (BALLOONS) ×3
BALLN STERLING OTW 3X220X150 (BALLOONS) ×3
BALLOON STERLING OTW 3X220X150 (BALLOONS) IMPLANT
BALLOON STRLNG OTW 2.5X80X150 (BALLOONS) IMPLANT
CLOSURE MYNX CONTROL 6F/7F (Vascular Products) ×1 IMPLANT
KIT ENCORE 26 ADVANTAGE (KITS) ×1 IMPLANT
KIT PV (KITS) ×1 IMPLANT
SHEATH PINNACLE 6F 10CM (SHEATH) ×1 IMPLANT
SYR MEDRAD MARK V 150ML (SYRINGE) ×1 IMPLANT
TRANSDUCER W/STOPCOCK (MISCELLANEOUS) ×1 IMPLANT
TRAY PV CATH (CUSTOM PROCEDURE TRAY) ×1 IMPLANT
WIRE BENTSON .035X145CM (WIRE) ×1 IMPLANT
WIRE G V18X300CM (WIRE) ×1 IMPLANT

## 2019-04-28 NOTE — Transfer of Care (Signed)
Immediate Anesthesia Transfer of Care Note  Patient: Sean [name]  Procedure(s) Performed: LEFT LOWER EXTREMITY ANGIOGRAM (Left ) Balloon Angioplasty of left posterior tibial artery (Left ) Placement Of Lysis Catheter  Patient Location: PACU  Anesthesia Type:General  Level of Consciousness: awake, alert  and oriented  Airway & Oxygen Therapy: Patient Spontanous Breathing  Post-op Assessment: Report given to RN and Post -op Vital signs reviewed and stable  Post vital signs: Reviewed and stable  Last Vitals:  Vitals Value Taken Time  BP 151/104 04/28/19 0146  Temp    Pulse 71 04/28/19 0152  Resp 12 04/28/19 0152  SpO2 96 % 04/28/19 0152  Vitals shown include unvalidated device data.  Last Pain:  Vitals:   04/27/19 1817  TempSrc:   PainSc: 9          Complications: No apparent anesthesia complications

## 2019-04-28 NOTE — Progress Notes (Signed)
Orthopedic Tech Progress Note Patient Details:  Sean [name] 31-Mar-1971 [id number] Received a page from the secretary requesting a knee immobilizer for patient. Went an applied it while both RNs were at bedside  Ortho Devices Type of Ortho Device: Knee Immobilizer Ortho Device/Splint Location: LRE Ortho Device/Splint Interventions: Adjustment, Application, Ordered   Post Interventions Patient Tolerated: Well Instructions Provided: Care of device, Adjustment of device   Janit Pagan 04/28/2019, 8:03 AM

## 2019-04-28 NOTE — Progress Notes (Signed)
0.5mg  Dilaudid wasted in sink with Cyd Silence, RN as witness (Pyxis not showing removal under pt's profile, Pharmacy aware).

## 2019-04-28 NOTE — Op Note (Signed)
Patient name: Puneet Masoner MRN: [id number] DOB: Jan 12, 1971 Sex: male  04/28/2019 Pre-operative Diagnosis: Acute on chronic left lower extremity limb ischemia Post-operative diagnosis:  Same Surgeon:  Marty Heck, MD Procedure Performed: 1.  Thrombolytics catheter check of the left lower extremity 2.  Left lower extremity arteriogram with selection of third order branches 3.  Left posterior tibial artery angioplasty (3 mm x 220 mm Sterling) 4.  Left posterior tibial artery angioplasty at the ankle (2.5 mm x 80 mm Sterling) 5.  Mynx closure of the right common femoral artery  Indications: Patient is a 48 year old male who presented last night with acute on chronic limb ischemia of his left lower extremity.  He previously had a left above-knee popliteal artery to posterior tibial artery bypass sewn end-to-end distally.  He had a UniFuse thrombolytics catheter placed down the bypass into the posterior tibial artery last night and presents for catheter check today.  He understands risks and benefits including risk of limb loss.  Findings:  I placed a V 18 down the UniFuse catheter into the posterior tibial artery distally and ultimately removed the UniFuse catheter.  Initial arteriogram via injection of the sheath showed filling of the above-knee to posterior tibial bypass that was very sluggish.  There was no outflow and no identifiable posterior tibial in the mid to distal calf and no outflow in the foot.  Ultimately I ballooned the entire posterior tibial artery with a 3 mm Sterling from the bypass down to just above the ankle and shot another picture that showed inline flow down the posterior tibial artery.  There seemed to be an additional disease segment of posterior tibial at the ankle and I used a smaller 2.5 mm Sterling to treat this.  On final injection there was inline flow down the posterior tibial with filling of the plantar arch and flow was much more brisk.  He states his leg  feels better.  He has a brisk posterior tibial signal in the left foot now.  The thrombolytics catheter as well as the sheath were all removed and a mynx closure was placed in the right groin.    Procedure:  The patient was identified in the holding area and taken to room 8.  The patient was then placed supine on the table and prepped and draped in the usual sterile fashion.  The existing right femoral sheath as well as thrombolytics catheter were prepped in the field.  A time out was called.  I initially remove the inner cannula of the UniFuse catheter.  I placed a V 18 wire down UniFuse catheter and into the posterior tibial under fluoroscopic guidance.  I then remove the UniFuse catheter.  I got staged imaging via hand-injection of the sheath over the aortic bifurcation imaging the left leg bypass as well as the posterior tibial.  As noted above there was filling in the bypass but there was no outflow and contrast appeared to cut off at the posterior tibial anastomosis distally.  Ultimately elected to angioplasty all this.  I selected a 3 mm x 220 mm Sterling after given additional 6000 units of IV heparin.  I placed this all the way down to just above the ankle and ballooned the entire posterior tibial into the bypass for 2 minutes at nominal pressure.  Another injection showed now inline flow with filling of the posterior tibial and the bypass.  There did appear to be an area of diseased posterior tibial right at the ankle.  I then advanced my wire further down and used a 2.5 mm Sterling and angioplastied this to nominal pressure for 2 minutes.  I gave additional nitroglycerin throughout the case.  Final injection there was inline flow down the posterior tibial that was very brisk with filling of the plantar arch.  Patient stated his foot felt better.  I did not see any dissection or other flow-limiting lesion that required stenting.  I went down checked his left foot and he had a very brisk posterior tibial  signal.  That point in time I used a Bentson wire and exchanged for a short 6 French sheath in the right groin.  A mynx closure device was deployed.     Cephus Shellinghristopher J. Athen Riel, MD [facility] [place] Office: [phone] Pager: [phone]  Cephus Shellinghristopher J Idella Lamontagne

## 2019-04-28 NOTE — Progress Notes (Signed)
  Progress Note    04/28/2019 8:16 AM 1 Day Post-Op  Subjective:  Having left foot pain today  Vitals:   04/28/19 0500 04/28/19 0650  BP: (!) 146/121 (!) 165/90  Pulse:    Resp: 19   Temp:    SpO2: 96%     Physical Exam: aaox3 Non labored respirations Abdomen is soft Right groin soft with sheath in place Left foot continues to be cold there is a posterior tibial signal Strong palpable left popliteal pulse  CBC    Component Value Date/Time   WBC 11.6 (H) 04/28/2019 0327   RBC 6.57 (H) 04/28/2019 0327   HGB 17.5 (H) 04/28/2019 0327   HCT 52.5 (H) 04/28/2019 0327   PLT 168 04/28/2019 0327   MCV 79.9 (L) 04/28/2019 0327   MCH 26.6 04/28/2019 0327   MCHC 33.3 04/28/2019 0327   RDW 17.1 (H) 04/28/2019 0327   LYMPHSABS 4.6 (H) 04/27/2019 2140   MONOABS 0.7 04/27/2019 2140   EOSABS 0.3 04/27/2019 2140   BASOSABS 0.1 04/27/2019 2140    BMET    Component Value Date/Time   NA 139 04/27/2019 2140   K 3.8 04/27/2019 2140   CL 104 04/27/2019 2140   CO2 24 04/27/2019 2140   GLUCOSE 102 (H) 04/27/2019 2140   BUN 10 04/27/2019 2140   CREATININE 1.11 04/27/2019 2140   CALCIUM 9.6 04/27/2019 2140   GFRNONAA >60 04/27/2019 2140   GFRAA >60 04/27/2019 2140    INR No results found for: INR   Intake/Output Summary (Last 24 hours) at 04/28/2019 0816 Last data filed at 04/28/2019 0500 Gross per 24 hour  Intake 983.96 ml  Output 470 ml  Net 513.96 ml     Assessment/plan:  48 y.o. male is s/p lytic catheter placement balloon angioplasty of his distal  anastomosis.  Foot is cold but there is a signal in the posterior tibial today.  Return to [facility] lab for leg recheck later today.  Patient remain n.p.o.  I discussed with patient's wife Estill Bamberg this is a limb threatening situation.  Brandon C. Donzetta Matters, MD [facility] [place] Office: [phone] Pager: [phone]  04/28/2019 8:16 AM

## 2019-04-28 NOTE — Progress Notes (Signed)
Patient arrived the unit from cath lab on a stretcher,assessment completed see flowsheet, placed on tele ccmd notified, patient oriented to room and staff, bed in lowest position call bell within reach will continue to monitor 

## 2019-04-28 NOTE — Progress Notes (Signed)
Unable to doppler Left PT pulse, absent left  pedal pulse( not new) Left popliteal pulse present, left leg warm, Brabham MD notified, no new order received, pain relieved by Morphine 5mg  PRN will continue to monitor.

## 2019-04-28 NOTE — Progress Notes (Signed)
[facility] for heparin Indication: arterial occlusion  Heparin Dosing Weight: 116.7 kg  Labs: Recent Labs    04/27/19 2140 04/28/19 0327  HGB 16.5 17.5*  HCT 51.4 52.5*  PLT 286 168  HEPARINUNFRC  --  0.53  CREATININE 1.11  --     Assessment: 89 yom presenting with leg pain constant since arterial bypass surgery in 09/2018. Pharmacy consulted to dose heparin for arterial occlusion. Not on anticoagulation PTA.  S/p vascular procedure - > heparin to resume tonight Still receiving TPA  Goal of Therapy:  Heparin level 0.3-0.7 units/ml Monitor platelets by anticoagulation protocol: Yes   Plan:  Heparin to restart at 1000 units / hr starting tonight at 22:30 pm AM heparin level TPA infusing - follow CBC and fibrinogen levels  Thank you Anette Guarneri, PharmD 04/28/2019 2:21 PM

## 2019-04-28 NOTE — Anesthesia Preprocedure Evaluation (Signed)
Anesthesia Evaluation  Patient identified by MRN, date of birth, ID band Patient awake    Reviewed: Allergy & Precautions, NPO status , Patient's Chart, lab work & pertinent test results  Airway Mallampati: II  TM Distance: >3 FB     Dental   Pulmonary Current Smoker,    breath sounds clear to auscultation       Cardiovascular hypertension, + Peripheral Vascular Disease   Rhythm:Regular Rate:Normal     Neuro/Psych    GI/Hepatic negative GI ROS, Neg liver ROS,   Endo/Other  negative endocrine ROS  Renal/GU negative Renal ROS     Musculoskeletal   Abdominal   Peds  Hematology   Anesthesia Other Findings   Reproductive/Obstetrics                             Anesthesia Physical Anesthesia Plan  ASA: III  Anesthesia Plan: General and MAC   Post-op Pain Management:    Induction:   PONV Risk Score and Plan: 1 and Ondansetron and Midazolam  Airway Management Planned: Simple Face Mask, Nasal Cannula and Oral ETT  Additional Equipment:   Intra-op Plan:   Post-operative Plan:   Informed Consent: I have reviewed the patients History and Physical, chart, labs and discussed the procedure including the risks, benefits and alternatives for the proposed anesthesia with the patient or authorized representative who has indicated his/her understanding and acceptance.     Dental advisory given  Plan Discussed with: CRNA and Anesthesiologist  Anesthesia Plan Comments:         Anesthesia Quick Evaluation

## 2019-04-28 NOTE — Anesthesia Postprocedure Evaluation (Signed)
Anesthesia Post Note  Patient: Sean [name]  Procedure(s) Performed: LEFT LOWER EXTREMITY ANGIOGRAM (Left ) Balloon Angioplasty of left posterior tibial artery (Left ) Placement Of Lysis Catheter     Patient location during evaluation: PACU Anesthesia Type: MAC Level of consciousness: awake Pain management: satisfactory to patient Vital Signs Assessment: post-procedure vital signs reviewed and stable Respiratory status: spontaneous breathing Cardiovascular status: stable Postop Assessment: no apparent nausea or vomiting Anesthetic complications: no    Last Vitals:  Vitals:   04/28/19 0500 04/28/19 0650  BP: (!) 146/121 (!) 165/90  Pulse:    Resp: 19   Temp:    SpO2: 96%     Last Pain:  Vitals:   04/28/19 0400  TempSrc: Oral  PainSc:                  Kaytelyn Glore

## 2019-04-28 NOTE — Op Note (Signed)
    Patient name: Sean [name] MRN: [id number] DOB: October 02, 1970 Sex: male  04/28/2019 Pre-operative Diagnosis: Acute on chronic left lower extremity ischemia Post-operative diagnosis:  Same Surgeon:  Eda Paschal. Donzetta Matters, MD Procedure Performed: 1.  Ultrasound-guided cannulation right common femoral artery 2.  Aortogram left lower extremity runoff 3.  Balloon angioplasty of left posterior tibial artery and bypass with 3 mm balloon 4.  Placement of 50 cm treatment length UniFuse catheter  Indications: 48 year old male has undergone a acute left above-knee popliteal artery bypass to his posterior tibial artery which is an end-to-end bypass distally.  He denies 2 weeks of left foot pain there are no signals in his left foot suggesting his bypass has occluded.  He is now indicated for angiogram possible endovascular versus open intervention  Findings: The aorta and proximal left lower extremity did not have any flow-limiting stenosis.  There is possible flow-limiting stenosis of the takeoff of the bypass but contrast does freely into the bypass.  Contrast hangs up in the bypass itself.  After balloon angioplasty distally there is still contrast hanging up in the bypass appears to be some amount of acute thrombus distally.  I elected to place a UniFuse catheter patient will need lytic recheck in the next 24 to 48 hours.   Procedure:  The patient was identified in the holding area and taken to the operating room where monitored anesthesia was induced.  He was gently prepped and draped in the right groin and entire left lower extremity usual fashion antibiotics were administered timeout was called.  We began using ultrasound to identify the right common femoral artery this was cannulated with micropuncture needle followed by wire and sheath.  Glidewire advantage was placed followed by 5 [place] sheath.  We placed an Omni catheter level of L1 performed aortogram.  We crossed bifurcation perform left lower extremity  angiography.  With the above findings we placed a long 6 [place] sheath.  I used first Glidewire advantage to get to the level of the bypass performed angiography no contrast was passed through.  I then placed a V 18 distally through the bypass CXI catheter distally confirmed the posterior tibial artery was patent and actually quite large.  I then performed balloon angioplasty of the distal bypass anastomosis back into the bypass with 3 mm balloon.  Contrast filling up in the bypass.  It did appear to be some acute clot distally.  I elected to place a UniFuse catheter which was placed across the bypass anastomosis.  It ended across the proximal anastomosis.  Will be hooked up to 0.5 mg/h of TPA and heparin will be flushed the sheath.  Patient will be scheduled for lytic recheck in the next 24 to 48 hours.  He tolerated procedure without immediate complication.  Contrast: 65 cc   [name] C. Donzetta Matters, MD [facility] [place] Office: [phone] Pager: [phone]

## 2019-04-29 ENCOUNTER — Other Ambulatory Visit: Payer: Self-pay | Admitting: Surgery

## 2019-04-29 ENCOUNTER — Other Ambulatory Visit: Payer: Self-pay | Admitting: Physician Assistant

## 2019-04-29 ENCOUNTER — Inpatient Hospital Stay (HOSPITAL_COMMUNITY): Payer: BLUE CROSS/BLUE SHIELD

## 2019-04-29 DIAGNOSIS — Z01810 Encounter for preprocedural cardiovascular examination: Secondary | ICD-10-CM

## 2019-04-29 LAB — HEPARIN LEVEL (UNFRACTIONATED): Heparin Unfractionated: <0.1 IU/mL — ABNORMAL LOW (ref 0.30–0.70)

## 2019-04-29 LAB — CBC
HCT: 44.9 % (ref 39.0–52.0)
Hemoglobin: 14.8 g/dL (ref 13.0–17.0)
MCH: 26.4 pg (ref 26.0–34.0)
MCHC: 33 g/dL (ref 30.0–36.0)
MCV: 80.2 fL (ref 80.0–100.0)
Platelets: 204 10*3/uL (ref 150–400)
RBC: 5.6 MIL/uL (ref 4.22–5.81)
RDW: 15.8 % — ABNORMAL HIGH (ref 11.5–15.5)
WBC: 9.4 10*3/uL (ref 4.0–10.5)
nRBC: 0 % (ref 0.0–0.2)

## 2019-04-29 MED ORDER — OXYCODONE-ACETAMINOPHEN 5-325 MG PO TABS: 2.0000 | ORAL_TABLET | ORAL | 0 refills | Status: DC | PRN

## 2019-04-29 MED ORDER — CLOPIDOGREL BISULFATE 75 MG PO TABS: 75.0000 mg | ORAL_TABLET | Freq: Every day | ORAL | 2 refills | Status: AC

## 2019-04-29 MED ORDER — RIVAROXABAN (XARELTO) VTE STARTER PACK (15 & 20 MG): ORAL_TABLET | ORAL | 0 refills | Status: DC

## 2019-04-29 MED ORDER — HEPARIN BOLUS VIA INFUSION
3500.0000 [IU] | Freq: Once | INTRAVENOUS | Status: AC
  Administered 2019-04-29: 3500 [IU] via INTRAVENOUS
  Filled 2019-04-29: qty 3500

## 2019-04-29 MED ORDER — OXYCODONE-ACETAMINOPHEN 5-325 MG PO TABS: 2.0000 | ORAL_TABLET | Freq: Four times a day (QID) | ORAL | 0 refills | Status: DC | PRN

## 2019-04-29 NOTE — Care Management (Signed)
[facility] reports that patient's copay is $0 and it is ready for him to pick up.  Patient has 30 day free and copay cards.

## 2019-04-29 NOTE — Discharge Instructions (Signed)
° °  [facility] [place] ° °Discharge Instructions ° °Lower Extremity Angiogram; Angioplasty/Stenting ° °Please refer to the following instructions for your post-procedure care. Your surgeon or physician assistant will discuss any changes with you. ° °Activity ° °Avoid lifting more than 8 pounds (1 gallons of milk) for 72 hours (3 days) after your procedure. You may walk as much as you can tolerate. It's OK to drive after 72 hours. ° °Bathing/Showering ° °You may shower the day after your procedure. If you have a bandage, you may remove it at 24- 48 hours. Clean your incision site with mild soap and water. Pat the area dry with a clean towel. ° °Diet ° °Resume your pre-procedure diet. There are no special food restrictions following this procedure. All patients with peripheral vascular disease should follow a low fat/low cholesterol diet. In order to heal from your surgery, it is CRITICAL to get adequate nutrition. Your body requires vitamins, minerals, and protein. Vegetables are the best source of vitamins and minerals. Vegetables also provide the perfect balance of protein. Processed food has little nutritional value, so try to avoid this. ° °Medications ° °Resume taking all of your medications unless your doctor tells you not to. If your incision is causing pain, you may take over-the-counter pain relievers such as acetaminophen (Tylenol) ° °Follow Up ° °Follow up will be arranged at the time of your procedure. You may have an office visit scheduled or may be scheduled for surgery. Ask your surgeon if you have any questions. ° °Please call us immediately for any of the following conditions: °•Severe or worsening pain your legs or feet at rest or with walking. °•Increased pain, redness, drainage at your groin puncture site. °•Fever of 101 degrees or higher. °•If you have any mild or slow bleeding from your puncture site: lie down, apply firm constant pressure over the area with a piece of  gauze or a clean wash cloth for 30 minutes- no peeking!, call 911 right away if you are still bleeding after 30 minutes, or if the bleeding is heavy and unmanageable. ° °Reduce your risk factors of vascular disease: ° °Stop smoking. If you would like help call [facility] at 1-800-QUIT-NOW (1-800-784-8669) or [facility] at 336-586-4000. °Manage your cholesterol °Maintain a desired weight °Control your diabetes °Keep your blood pressure down ° °If you have any questions, please call the office at [phone] ° °

## 2019-04-29 NOTE — Progress Notes (Signed)
VASCULAR LAB PRELIMINARY  PRELIMINARY  PRELIMINARY  PRELIMINARY  Duplex of arterial graft completed.    Preliminary report:  See CV proc for preliminary results.   Jailey Booton, RVT 04/29/2019, 12:56 PM

## 2019-04-29 NOTE — Progress Notes (Addendum)
  Progress Note    04/29/2019 8:34 AM 1 Day Post-Op  Subjective:  L foot feeling better since heparin started last night; minimal pain overnight   Vitals:   04/29/19 0727 04/29/19 0745  BP: (!) 167/101 138/83  Pulse:  84  Resp:  17  Temp:  98.4 F (36.9 C)  SpO2:  97%   Physical Exam: Lungs:  Non labored Extremities:  L PT signal soft by doppler Neurologic: A&O  CBC    Component Value Date/Time   WBC 9.4 04/29/2019 0615   RBC 5.60 04/29/2019 0615   HGB 14.8 04/29/2019 0615   HCT 44.9 04/29/2019 0615   PLT 204 04/29/2019 0615   MCV 80.2 04/29/2019 0615   MCH 26.4 04/29/2019 0615   MCHC 33.0 04/29/2019 0615   RDW 15.8 (H) 04/29/2019 0615   LYMPHSABS 4.6 (H) 04/27/2019 2140   MONOABS 0.7 04/27/2019 2140   EOSABS 0.3 04/27/2019 2140   BASOSABS 0.1 04/27/2019 2140    BMET    Component Value Date/Time   NA 139 04/27/2019 2140   K 3.8 04/27/2019 2140   CL 104 04/27/2019 2140   CO2 24 04/27/2019 2140   GLUCOSE 102 (H) 04/27/2019 2140   BUN 10 04/27/2019 2140   CREATININE 1.11 04/27/2019 2140   CALCIUM 9.6 04/27/2019 2140   GFRNONAA >60 04/27/2019 2140   GFRAA >60 04/27/2019 2140    INR No results found for: INR   Intake/Output Summary (Last 24 hours) at 04/29/2019 0834 Last data filed at 04/29/2019 0730 Gross per 24 hour  Intake 1334.34 ml  Output 1150 ml  Net 184.34 ml     Assessment/Plan:  48 y.o. male is s/p thrombolysis LLE with angioplasty of PTA 1 Day Post-Op   Rest pain resolved with heparin overnight L PTA signal by doppler slightly improved R groin unremarkable; monitor H&H   Dagoberto Ligas, PA-C [facility] [phone] 04/29/2019 8:34 AM  I agree with the above.  I have seen and evaluated to the patient.  His left PT signal is improved this am.  We will check duplex of bypass this am.  He wants to go homet o his 48th birthday party today.  I will start [place] in addition to DAPT  Annamarie Major

## 2019-04-29 NOTE — Progress Notes (Signed)
[place] for heparin Indication: arterial occlusion  Heparin Dosing Weight: 116.7 kg  Labs: Recent Labs    04/27/19 2140 04/28/19 0327 04/29/19 0615  HGB 16.5 17.5* 14.8  HCT 51.4 52.5* 44.9  PLT 286 168 204  HEPARINUNFRC  --  0.53  --   CREATININE 1.11  --   --     Assessment: 17 yom presenting with leg pain constant since arterial bypass surgery in 09/2018. Pharmacy consulted to dose heparin for arterial occlusion. Not on anticoagulation PTA.  S/p vascular procedure with heparin resumed evening of 9/4. TPA is discontinued.   9/5 HL subtherapeutic at <0.1 on 1000 units/hr. H&H decreased but are normal at 14.8/44.9. Platelets are stable at 204. Per nursing, no signs of bleeding, pt only has one line and heparin was paused briefly to administer IV metoprolol at 0558, with HL drawn at 0615. Will still bolus and increase infusion rate but will use slightly lower dose increase for infusion rate due to therapeutic heparin level yesterday.    Goal of Therapy:  Heparin level 0.3-0.7 units/ml Monitor platelets by anticoagulation protocol: Yes   Plan:  Bolus heparin 3500 units (30 units/kg) Increase heparin infusion to 1300 units/hour (~3 units/kg dose increase) Check 6 hour heparin level  Monitor s/sx of bleeding, H&H, and plts.     Thank you,   Eddie Candle, PharmD PGY-1 Pharmacy Resident

## 2019-04-30 NOTE — Discharge Summary (Signed)
Physician Discharge Summary   [name] [id number] 48 y.o. August 14, 1971  Admit date: 04/27/2019  Discharge date and time: 04/29/2019  2:32 PM   Admitting Physician: Waynetta Sandy, MD   Discharge Physician: Dr. Trula Slade  Admission Diagnoses: Ischemia of left lower extremity [I99.8]  Discharge Diagnoses: same  Admission Condition: poor  Discharged Condition: fair  Indication for Admission: ischemic left lower extremity  Hospital Course: Sean [name] is a 48 year old male who came into the emergency department with an ischemic left lower extremity with history of femoral to popliteal bypass with vein.  He underwent arteriogram by Dr. Donzetta Matters which demonstrated a patent bypass however occluded outflow.  He placed a lysis catheter for TPA infusion and patient was admitted to the ICU postoperatively.  He was taken back to the [facility] lab by Dr. Carlis Abbott for recheck lysis.  This also included balloon angioplasty of bypass outflow including posterior tibial artery.  Postoperatively he was continued on IV heparin.  POD #1 arterial duplex demonstrated a patent bypass graft with monophasic flow in the posterior tibial artery.  He will be prescribed Plavix in addition to his daily aspirin.  He will also be prescribed Xarelto starter pack.  He will follow-up with Dr. Donzetta Matters in office in about 2 to 3 weeks.  He will be discharged home in stable condition.  Consults: None  Treatments: surgery: Left lower extremity percutaneous thrombolysis of vein graft by Dr. Donzetta Matters 04/27/19.  Recheck lysis and balloon angioplasty of bypass outflow including posterior tibial artery by Dr. Carlis Abbott 04/28/19  Discharge Exam: see progress note 04/29/19 Vitals:   04/29/19 1145 04/29/19 1200  BP: (!) 158/99   Pulse: 81 97  Resp: 18 (!) 21  Temp: 98.4 F (36.9 C)   SpO2: 96% 94%     Disposition: home  Patient Instructions:  Allergies as of 04/29/2019   No Known Allergies     Medication List    TAKE  these medications   amLODipine 10 MG tablet Commonly known as: NORVASC Take 10 mg by mouth daily.   aspirin EC 81 MG tablet Take 81 mg by mouth daily.   clopidogrel 75 MG tablet Commonly known as: PLAVIX Take 1 tablet (75 mg total) by mouth daily with breakfast.   lisinopril-hydrochlorothiazide 20-25 MG tablet Commonly known as: ZESTORETIC Take 2 tablets by mouth daily.   Rivaroxaban 15 & 20 MG Tbpk Take as directed on package: Start with one 15mg  tablet by mouth twice a day with food. On Day 22, switch to one 20mg  tablet once a day with food. Notes to patient: Take as directed       Activity: activity as tolerated Diet: regular diet Wound Care: none needed  Follow-up with Dr. Donzetta Matters in 2 weeks.  SignedDagoberto Ligas 04/30/2019 9:01 AM

## 2019-05-02 ENCOUNTER — Encounter (HOSPITAL_COMMUNITY): Payer: Self-pay | Admitting: Vascular Surgery

## 2019-05-03 ENCOUNTER — Other Ambulatory Visit: Payer: Self-pay

## 2019-05-03 DIAGNOSIS — I739 Peripheral vascular disease, unspecified: Secondary | ICD-10-CM

## 2019-05-04 ENCOUNTER — Telehealth (HOSPITAL_COMMUNITY): Payer: Self-pay

## 2019-05-04 NOTE — Telephone Encounter (Signed)

## 2019-05-05 ENCOUNTER — Other Ambulatory Visit: Payer: Self-pay

## 2019-05-05 ENCOUNTER — Encounter (HOSPITAL_COMMUNITY): Payer: BLUE CROSS/BLUE SHIELD

## 2019-05-05 ENCOUNTER — Telehealth: Payer: Self-pay

## 2019-05-05 ENCOUNTER — Inpatient Hospital Stay (HOSPITAL_COMMUNITY)
Admission: AD | Admit: 2019-05-05 | Discharge: 2019-05-16 | DRG: 272 | Disposition: A | Discharge status: Rehabilitation Facility | Payer: BLUE CROSS/BLUE SHIELD | Source: Ambulatory Visit | Attending: Vascular Surgery | Admitting: Vascular Surgery

## 2019-05-05 ENCOUNTER — Ambulatory Visit: Payer: BLUE CROSS/BLUE SHIELD | Admitting: Vascular Surgery

## 2019-05-05 ENCOUNTER — Ambulatory Visit (INDEPENDENT_AMBULATORY_CARE_PROVIDER_SITE_OTHER): Payer: BLUE CROSS/BLUE SHIELD | Admitting: Physician Assistant

## 2019-05-05 VITALS — BP 145/102 | HR 106 | Temp 97.7°F | Resp 20 | Ht 77.0 in | Wt 285.0 lb

## 2019-05-05 DIAGNOSIS — Z79899 Other long term (current) drug therapy: Secondary | ICD-10-CM

## 2019-05-05 DIAGNOSIS — Z7902 Long term (current) use of antithrombotics/antiplatelets: Secondary | ICD-10-CM

## 2019-05-05 DIAGNOSIS — I10 Essential (primary) hypertension: Secondary | ICD-10-CM | POA: Diagnosis present

## 2019-05-05 DIAGNOSIS — Z23 Encounter for immunization: Secondary | ICD-10-CM | POA: Diagnosis not present

## 2019-05-05 DIAGNOSIS — Z7982 Long term (current) use of aspirin: Secondary | ICD-10-CM

## 2019-05-05 DIAGNOSIS — I779 Disorder of arteries and arterioles, unspecified: Secondary | ICD-10-CM | POA: Diagnosis not present

## 2019-05-05 DIAGNOSIS — I743 Embolism and thrombosis of arteries of the lower extremities: Secondary | ICD-10-CM | POA: Diagnosis present

## 2019-05-05 DIAGNOSIS — I998 Other disorder of circulatory system: Secondary | ICD-10-CM | POA: Diagnosis present

## 2019-05-05 DIAGNOSIS — G546 Phantom limb syndrome with pain: Secondary | ICD-10-CM | POA: Diagnosis present

## 2019-05-05 DIAGNOSIS — M79675 Pain in left toe(s): Secondary | ICD-10-CM | POA: Diagnosis present

## 2019-05-05 DIAGNOSIS — Z20828 Contact with and (suspected) exposure to other viral communicable diseases: Secondary | ICD-10-CM | POA: Diagnosis present

## 2019-05-05 DIAGNOSIS — I70262 Atherosclerosis of native arteries of extremities with gangrene, left leg: Secondary | ICD-10-CM | POA: Diagnosis not present

## 2019-05-05 DIAGNOSIS — I739 Peripheral vascular disease, unspecified: Secondary | ICD-10-CM | POA: Diagnosis present

## 2019-05-05 LAB — COMPREHENSIVE METABOLIC PANEL
ALT: 24 U/L (ref 0–44)
AST: 22 U/L (ref 15–41)
Albumin: 3.8 g/dL (ref 3.5–5.0)
Alkaline Phosphatase: 61 U/L (ref 38–126)
Anion gap: 13 (ref 5–15)
BUN: <5 mg/dL — ABNORMAL LOW (ref 6–20)
CO2: 21 mmol/L — ABNORMAL LOW (ref 22–32)
Calcium: 9.1 mg/dL (ref 8.9–10.3)
Chloride: 103 mmol/L (ref 98–111)
Creatinine, Ser: 1.04 mg/dL (ref 0.61–1.24)
GFR calc Af Amer: >60 mL/min (ref >60)
GFR calc non Af Amer: >60 mL/min (ref >60)
Glucose, Bld: 143 mg/dL — ABNORMAL HIGH (ref 70–99)
Potassium: 3.8 mmol/L (ref 3.5–5.1)
Sodium: 137 mmol/L (ref 135–145)
Total Bilirubin: 0.5 mg/dL (ref 0.3–1.2)
Total Protein: 6.4 g/dL — ABNORMAL LOW (ref 6.5–8.1)

## 2019-05-05 LAB — CBC
HCT: 45.3 % (ref 39.0–52.0)
Hemoglobin: 14.9 g/dL (ref 13.0–17.0)
MCH: 26.5 pg (ref 26.0–34.0)
MCHC: 32.9 g/dL (ref 30.0–36.0)
MCV: 80.6 fL (ref 80.0–100.0)
Platelets: 296 10*3/uL (ref 150–400)
RBC: 5.62 MIL/uL (ref 4.22–5.81)
RDW: 15.2 % (ref 11.5–15.5)
WBC: 9.3 10*3/uL (ref 4.0–10.5)
nRBC: 0 % (ref 0.0–0.2)

## 2019-05-05 LAB — PROTIME-INR
INR: 1.2 (ref 0.8–1.2)
Prothrombin Time: 15.2 seconds (ref 11.4–15.2)

## 2019-05-05 MED ORDER — ALUM & MAG HYDROXIDE-SIMETH 200-200-20 MG/5ML PO SUSP: 15.0000 mL | ORAL | Status: DC | PRN

## 2019-05-05 MED ORDER — METOPROLOL TARTRATE 5 MG/5ML IV SOLN: 2.0000 mg | INTRAVENOUS | Status: DC | PRN

## 2019-05-05 MED ORDER — POTASSIUM CHLORIDE CRYS ER 20 MEQ PO TBCR
20.0000 meq | EXTENDED_RELEASE_TABLET | Freq: Once | ORAL | Status: AC
  Administered 2019-05-05: 40 meq via ORAL
  Filled 2019-05-05: qty 2

## 2019-05-05 MED ORDER — ONDANSETRON HCL 4 MG/2ML IJ SOLN: 4.0000 mg | Freq: Four times a day (QID) | INTRAMUSCULAR | Status: DC | PRN

## 2019-05-05 MED ORDER — PANTOPRAZOLE SODIUM 40 MG PO TBEC
40.0000 mg | DELAYED_RELEASE_TABLET | Freq: Every day | ORAL | Status: DC
  Administered 2019-05-05 – 2019-05-16 (×12): 40 mg via ORAL
  Filled 2019-05-05 (×12): qty 1

## 2019-05-05 MED ORDER — BISACODYL 5 MG PO TBEC
5.0000 mg | DELAYED_RELEASE_TABLET | Freq: Every day | ORAL | Status: DC | PRN
  Administered 2019-05-06 – 2019-05-10 (×3): 5 mg via ORAL
  Filled 2019-05-05 (×3): qty 1

## 2019-05-05 MED ORDER — HYDROMORPHONE 1 MG/ML IV SOLN
INTRAVENOUS | Status: DC
  Administered 2019-05-05: 1 mg via INTRAVENOUS
  Administered 2019-05-06: 0.3 mg via INTRAVENOUS
  Administered 2019-05-06: 0.6 mg via INTRAVENOUS
  Filled 2019-05-05: qty 30

## 2019-05-05 MED ORDER — HYDRALAZINE HCL 20 MG/ML IJ SOLN
5.0000 mg | INTRAMUSCULAR | Status: DC | PRN
  Administered 2019-05-06: 09:00:00 5 mg via INTRAVENOUS
  Filled 2019-05-05: qty 1

## 2019-05-05 MED ORDER — SENNOSIDES-DOCUSATE SODIUM 8.6-50 MG PO TABS
1.0000 | ORAL_TABLET | Freq: Every evening | ORAL | Status: DC | PRN
  Administered 2019-05-10: 10:00:00 1 via ORAL
  Filled 2019-05-05: qty 1

## 2019-05-05 MED ORDER — SODIUM CHLORIDE 0.9 % IV SOLN: 250.0000 mL | INTRAVENOUS | Status: DC | PRN

## 2019-05-05 MED ORDER — LABETALOL HCL 5 MG/ML IV SOLN: 10.0000 mg | INTRAVENOUS | Status: DC | PRN

## 2019-05-05 MED ORDER — NALOXONE HCL 0.4 MG/ML IJ SOLN: 0.4000 mg | INTRAMUSCULAR | Status: DC | PRN

## 2019-05-05 MED ORDER — DIPHENHYDRAMINE HCL 12.5 MG/5ML PO ELIX: 12.5000 mg | ORAL_SOLUTION | Freq: Four times a day (QID) | ORAL | Status: DC | PRN

## 2019-05-05 MED ORDER — HEPARIN (PORCINE) 25000 UT/250ML-% IV SOLN
1850.0000 [IU]/h | INTRAVENOUS | Status: DC
  Administered 2019-05-06 – 2019-05-08 (×5): 1850 [IU]/h via INTRAVENOUS
  Filled 2019-05-05 (×6): qty 250

## 2019-05-05 MED ORDER — SODIUM CHLORIDE 0.9% FLUSH
3.0000 mL | Freq: Two times a day (BID) | INTRAVENOUS | Status: DC
  Administered 2019-05-05 – 2019-05-15 (×14): 3 mL via INTRAVENOUS

## 2019-05-05 MED ORDER — SODIUM CHLORIDE 0.9% FLUSH: 9.0000 mL | INTRAVENOUS | Status: DC | PRN

## 2019-05-05 MED ORDER — DIPHENHYDRAMINE HCL 50 MG/ML IJ SOLN: 12.5000 mg | Freq: Four times a day (QID) | INTRAMUSCULAR | Status: DC | PRN

## 2019-05-05 MED ORDER — DOCUSATE SODIUM 100 MG PO CAPS
100.0000 mg | ORAL_CAPSULE | Freq: Two times a day (BID) | ORAL | Status: DC
  Administered 2019-05-05 – 2019-05-15 (×20): 100 mg via ORAL
  Filled 2019-05-05 (×21): qty 1

## 2019-05-05 MED ORDER — SODIUM CHLORIDE 0.9% FLUSH: 3.0000 mL | INTRAVENOUS | Status: DC | PRN

## 2019-05-05 NOTE — Progress Notes (Signed)
ANTICOAGULATION CONSULT NOTE - Initial Consult  Pharmacy Consult for Heparin Indication: VTE treatment  No Known Allergies  Patient Measurements:   Heparin Dosing Weight: 116.7 kg  Vital Signs: Temp: 98 F (36.7 C) (09/11 1929) Temp Source: Oral (09/11 1929) BP: 148/99 (09/11 1929) Pulse Rate: 111 (09/11 1929)  Labs: No results for input(s): HGB, HCT, PLT, APTT, LABPROT, INR, HEPARINUNFRC, HEPRLOWMOCWT, CREATININE, CKTOTAL, CKMB, TROPONINIHS in the last 72 hours.  Estimated Creatinine Clearance: 118.5 mL/min (by C-G formula based on SCr of 1.11 mg/dL).   Medical History: Past Medical History:  Diagnosis Date  . Hypertension   . PVD (peripheral vascular disease) (HCC)     Assessment: 48 y.o.malewith ischemic toes on the left foot and severe pain.  Patient was discharged on Plavix and Xarelto on 9/5 for bypass and angioplasty of the posteriori tibial artery.  Pharmacy is consulted to start systemic anticoagulation with a heparin drip.  Patient took his last dose of Xarelto in the am on 9/11.  Systemic anticoagulation should be initiated 24 hours after the last rivaroxaban dose.  Will start heparin drip at 0800 on 9/12.  Will initially need to monitor aPTT and HL due to the presence of the rivaroxaban in the patient's system.  Goal of Therapy:  Heparin level 0.3-0.7 units/ml  Target aPTT 66 - 102 secs Monitor platelets by anticoagulation protocol: Yes   Plan:  D/c Xarelto Start Heparin at 0800 on  9/12 at 1850 units/hr Draw an aPTT and HL 8 hours after heparin is started Monitor qam aPTT and HL and CBC while on therapy  Alanda Slim, PharmD, [facility] Clinical Pharmacist Please see AMION for all Pharmacists' Contact Phone Numbers 05/05/2019, 8:26 PM

## 2019-05-05 NOTE — Telephone Encounter (Signed)
Patients wife called and said that he is having severe pain in his feet and she needs to have something done because it is so bad. She said that his toes are cold and he is not able to move them. Asked if he was taking the Percocet that was given to him earlier this past week and she said that he was but it is not touching the pain.   Appt made for pt to come in to be seen.  York Cerise, CMA

## 2019-05-05 NOTE — Progress Notes (Signed)
POST OPERATIVE OFFICE NOTE    CC:  F/u for surgery  HPI:  This is a 48 y.o. male who is s/p Left lower extremity percutaneous thrombolysis of vein graft by Dr. Randie Heinzain 04/27/19.  Recheck lysis and balloon angioplasty of bypass outflow including posterior tibial artery by Dr. Chestine Sporelark 04/28/19.  09/21/18 Left above-knee popliteal to posterior tibial artery bypass with non-reversed ipsilateral greater saphenous vein with 4 compartment fasciotomies by Dr. Randie Heinzain.    He comes to our office with reports of sever pain in his toes that keeps him from sleeping and ADL's. He states that the oxycodone does not relieve his pain at all.    He reports left foot all toes are throbbing with pain and no position change helps.  He denise fever and chills.  He continues to take Plavix and xarelto daily.     No Known Allergies  Current Outpatient Medications  Medication Sig Dispense Refill  . amLODipine (NORVASC) 10 MG tablet Take 10 mg by mouth daily.    Marland Kitchen. aspirin EC 81 MG tablet Take 81 mg by mouth daily.    . clopidogrel (PLAVIX) 75 MG tablet Take 1 tablet (75 mg total) by mouth daily with breakfast. 30 tablet 2  . gabapentin (NEURONTIN) 100 MG capsule Take by mouth.    Marland Kitchen. lisinopril-hydrochlorothiazide (PRINZIDE,ZESTORETIC) 20-25 MG tablet Take 2 tablets by mouth daily.     . Rivaroxaban 15 & 20 MG TBPK Take as directed on package: Start with one 15mg  tablet by mouth twice a day with food. On Day 22, switch to one 20mg  tablet once a day with food. 51 each 0  . sildenafil (REVATIO) 20 MG tablet Take up to 4 tablets 30 minutes prior to sexual activity as needed for erectile dysfunction.  Maximum daily dose: 5 tablets (100 mg total).    Marland Kitchen. oxyCODONE-acetaminophen (PERCOCET) 5-325 MG tablet Take 2 tablets by mouth every 6 (six) hours as needed for up to 30 doses for severe pain. (Patient not taking: Reported on 05/05/2019) 30 tablet 0   No current facility-administered medications for this visit.      ROS:  See HPI  Vitals:   05/05/19 1417  BP: (!) 145/102  Pulse: (!) 106  Resp: 20  Temp: 97.7 F (36.5 C)  SpO2: 97%    Physical Exam:    Incision:  Below popliteal area incision is healing well s/p thrombectomy. Extremities: Dusky dark discoloration to all 5 toes on the left foot.  Toes are cool to touch without open wounds.  Doppler PT to the ankle no pedal pulses.  Neuro: minimal toe motion with pain.   Abdomen:  Soft, +BS Heart RRR Lungs non labored breathing  Assessment/Plan:  This is a 48 y.o. male with ischemic toes on the left foot and sever pain s/p: Left lower extremity percutaneous thrombolysis of vein graft by Dr. Randie Heinzain 04/27/19.  Recheck lysis and balloon angioplasty of bypass outflow including posterior tibial artery by Dr. Chestine Sporelark 04/28/19.  09/21/18 Left above-knee popliteal to posterior tibial artery bypass with non-reversed ipsilateral greater saphenous vein with 4 compartment fasciotomies by Dr. Randie Heinzain.    He is in sever pain and will be admitted for pain control and we discussed the possibility of amputation.  Im not sure if toe amputations or TMA will heal he may have to consider BKA.  I will let DR. Randie HeinzCain review his chart and make a decision.  I will hold his Xarelto and place him on Heparin.  [name] PA-C [facility] [phone]  Clinic MD:  Donzetta Matters

## 2019-05-05 NOTE — H&P (Signed)
  HP    CC:  pain in all of left toes  HPI:  This is a 48 y.o. male who is s/p Left lower extremity percutaneous thrombolysis of vein graft by Dr. Donzetta Matters 04/27/19. Recheck lysis and balloon angioplasty of bypass outflow including posterior tibial artery by Dr. Carlis Abbott 04/28/19.  09/21/18 Left above-knee popliteal to posterior tibial artery bypass with non-reversed ipsilateral greater saphenous vein with 4 compartment fasciotomies by Dr. Donzetta Matters.               He comes to our office with reports of sever pain in his toes that keeps him from sleeping and ADL's. He states that the oxycodone does not relieve his pain at all.               He reports left foot all toes are throbbing with pain and no position change helps.  He denise fever and chills.  He continues to take Plavix and xarelto daily.     No Known Allergies        Current Outpatient Medications  Medication Sig Dispense Refill  . amLODipine (NORVASC) 10 MG tablet Take 10 mg by mouth daily.    Marland Kitchen aspirin EC 81 MG tablet Take 81 mg by mouth daily.    . clopidogrel (PLAVIX) 75 MG tablet Take 1 tablet (75 mg total) by mouth daily with breakfast. 30 tablet 2  . gabapentin (NEURONTIN) 100 MG capsule Take by mouth.    Marland Kitchen lisinopril-hydrochlorothiazide (PRINZIDE,ZESTORETIC) 20-25 MG tablet Take 2 tablets by mouth daily.     . Rivaroxaban 15 & 20 MG TBPK Take as directed on package: Start with one 15mg  tablet by mouth twice a day with food. On Day 22, switch to one 20mg  tablet once a day with food. 51 each 0  . sildenafil (REVATIO) 20 MG tablet Take up to 4 tablets 30 minutes prior to sexual activity as needed for erectile dysfunction.  Maximum daily dose: 5 tablets (100 mg total).    Marland Kitchen oxyCODONE-acetaminophen (PERCOCET) 5-325 MG tablet Take 2 tablets by mouth every 6 (six) hours as needed for up to 30 doses for severe pain. (Patient not taking: Reported on 05/05/2019) 30 tablet 0   No current facility-administered medications for this  visit.      ROS:  See HPI    Vitals:   05/05/19 1417  BP: (!) 145/102  Pulse: (!) 106  Resp: 20  Temp: 97.7 F (36.5 C)  SpO2: 97%    Physical Exam:    Incision:  Below popliteal area incision is healing well s/p thrombectomy. Extremities: Dusky dark discoloration to all 5 toes on the left foot.  Toes are cool to touch without open wounds.  Doppler PT to the ankle no pedal pulses.  Neuro: minimal toe motion with pain.   Abdomen:  Soft, +BS Heart RRR Lungs non labored breathing  Assessment/Plan:  This is a 48 y.o. male with ischemic toes on the left foot and severe pain. Toes are warm on physical exam. Will repeat duplex and ABI this weekend. Possibly will need repeat angiography early next week with stenting of the outflow. Heparin gtt and hold xarelto for now. High risk for tma or bka on this admission.   Crystallee Werden C. Donzetta Matters, MD [facility] [place] Office: [phone] Pager: [phone]

## 2019-05-06 ENCOUNTER — Inpatient Hospital Stay (HOSPITAL_COMMUNITY): Payer: BLUE CROSS/BLUE SHIELD

## 2019-05-06 ENCOUNTER — Encounter (HOSPITAL_COMMUNITY): Payer: Self-pay | Admitting: *Deleted

## 2019-05-06 DIAGNOSIS — I739 Peripheral vascular disease, unspecified: Secondary | ICD-10-CM

## 2019-05-06 LAB — URINALYSIS, ROUTINE W REFLEX MICROSCOPIC
Bacteria, UA: NONE SEEN
Bilirubin Urine: NEGATIVE
Glucose, UA: NEGATIVE mg/dL
Ketones, ur: NEGATIVE mg/dL
Leukocytes,Ua: NEGATIVE
Nitrite: NEGATIVE
Protein, ur: NEGATIVE mg/dL
Specific Gravity, Urine: 1.018 (ref 1.005–1.030)
pH: 5 (ref 5.0–8.0)

## 2019-05-06 LAB — HEPARIN LEVEL (UNFRACTIONATED): Heparin Unfractionated: 0.91 IU/mL — ABNORMAL HIGH (ref 0.30–0.70)

## 2019-05-06 LAB — APTT: aPTT: 88 seconds — ABNORMAL HIGH (ref 24–36)

## 2019-05-06 MED ORDER — OXYCODONE-ACETAMINOPHEN 5-325 MG PO TABS
1.0000 | ORAL_TABLET | ORAL | Status: DC | PRN
  Administered 2019-05-06 – 2019-05-09 (×8): 1 via ORAL
  Administered 2019-05-10: 2 via ORAL
  Administered 2019-05-10 – 2019-05-11 (×2): 1 via ORAL
  Administered 2019-05-14: 2 via ORAL
  Administered 2019-05-15 (×2): 1 via ORAL
  Administered 2019-05-15 (×2): 2 via ORAL
  Administered 2019-05-15: 1 via ORAL
  Administered 2019-05-15 – 2019-05-16 (×3): 2 via ORAL
  Filled 2019-05-06: qty 2
  Filled 2019-05-06 (×5): qty 1
  Filled 2019-05-06: qty 2
  Filled 2019-05-06 (×2): qty 1
  Filled 2019-05-06 (×2): qty 2
  Filled 2019-05-06: qty 1
  Filled 2019-05-06 (×3): qty 2
  Filled 2019-05-06 (×5): qty 1

## 2019-05-06 MED ORDER — DIPHENHYDRAMINE HCL 50 MG/ML IJ SOLN: 12.5000 mg | Freq: Four times a day (QID) | INTRAMUSCULAR | Status: DC | PRN

## 2019-05-06 MED ORDER — ONDANSETRON HCL 4 MG/2ML IJ SOLN
4.0000 mg | Freq: Four times a day (QID) | INTRAMUSCULAR | Status: DC | PRN
  Administered 2019-05-06 – 2019-05-07 (×2): 4 mg via INTRAVENOUS
  Filled 2019-05-06 (×2): qty 2

## 2019-05-06 MED ORDER — CLOPIDOGREL BISULFATE 75 MG PO TABS
75.0000 mg | ORAL_TABLET | Freq: Every day | ORAL | Status: DC
  Administered 2019-05-06 – 2019-05-16 (×10): 75 mg via ORAL
  Filled 2019-05-06 (×10): qty 1

## 2019-05-06 MED ORDER — GABAPENTIN 100 MG PO CAPS
100.0000 mg | ORAL_CAPSULE | Freq: Three times a day (TID) | ORAL | Status: DC
  Administered 2019-05-06 – 2019-05-14 (×26): 100 mg via ORAL
  Filled 2019-05-06 (×26): qty 1

## 2019-05-06 MED ORDER — DIPHENHYDRAMINE HCL 12.5 MG/5ML PO ELIX: 12.5000 mg | ORAL_SOLUTION | Freq: Four times a day (QID) | ORAL | Status: DC | PRN

## 2019-05-06 MED ORDER — HYDROMORPHONE 1 MG/ML IV SOLN
INTRAVENOUS | Status: DC
  Administered 2019-05-06: 5 mg via INTRAVENOUS
  Administered 2019-05-06: 5 mL via INTRAVENOUS
  Administered 2019-05-06: 3.8 mg via INTRAVENOUS
  Administered 2019-05-07: 30 mg via INTRAVENOUS
  Administered 2019-05-07: 5 mg via INTRAVENOUS
  Administered 2019-05-07: 8 mg via INTRAVENOUS
  Administered 2019-05-07: 6.5 mg via INTRAVENOUS
  Administered 2019-05-07: 3 mg via INTRAVENOUS
  Administered 2019-05-08: 13 mg via INTRAVENOUS
  Administered 2019-05-08: 2 mg via INTRAVENOUS
  Administered 2019-05-08: 30 mg via INTRAVENOUS
  Administered 2019-05-08: 8 mg via INTRAVENOUS
  Administered 2019-05-08: 4 mg via INTRAVENOUS
  Administered 2019-05-08: 2.5 mg via INTRAVENOUS
  Administered 2019-05-09: 30 mg via INTRAVENOUS
  Administered 2019-05-09 (×2): 3 mg via INTRAVENOUS
  Administered 2019-05-10: 2.4 mg via INTRAVENOUS
  Administered 2019-05-10 (×2): 30 mg via INTRAVENOUS
  Administered 2019-05-11: 0.3 mg via INTRAVENOUS
  Administered 2019-05-11: 0.6 mg via INTRAVENOUS
  Filled 2019-05-06 (×3): qty 30

## 2019-05-06 MED ORDER — SODIUM CHLORIDE 0.9% FLUSH: 9.0000 mL | INTRAVENOUS | Status: DC | PRN

## 2019-05-06 MED ORDER — AMLODIPINE BESYLATE 10 MG PO TABS
10.0000 mg | ORAL_TABLET | Freq: Every day | ORAL | Status: DC
  Administered 2019-05-06 – 2019-05-16 (×11): 10 mg via ORAL
  Filled 2019-05-06 (×11): qty 1

## 2019-05-06 MED ORDER — PNEUMOCOCCAL VAC POLYVALENT 25 MCG/0.5ML IJ INJ
0.5000 mL | INJECTION | INTRAMUSCULAR | Status: AC
  Administered 2019-05-07: 0.5 mL via INTRAMUSCULAR
  Filled 2019-05-06: qty 0.5

## 2019-05-06 MED ORDER — INFLUENZA VAC SPLIT QUAD 0.5 ML IM SUSY
0.5000 mL | PREFILLED_SYRINGE | INTRAMUSCULAR | Status: DC
  Filled 2019-05-06: qty 0.5

## 2019-05-06 MED ORDER — NALOXONE HCL 0.4 MG/ML IJ SOLN: 0.4000 mg | INTRAMUSCULAR | Status: DC | PRN

## 2019-05-06 MED ORDER — METHOCARBAMOL 1000 MG/10ML IJ SOLN
500.0000 mg | Freq: Four times a day (QID) | INTRAVENOUS | Status: DC | PRN
  Administered 2019-05-06 – 2019-05-15 (×2): 500 mg via INTRAVENOUS
  Filled 2019-05-06 (×2): qty 500

## 2019-05-06 MED ORDER — ASPIRIN EC 81 MG PO TBEC
81.0000 mg | DELAYED_RELEASE_TABLET | Freq: Every day | ORAL | Status: DC
  Administered 2019-05-06 – 2019-05-16 (×11): 81 mg via ORAL
  Filled 2019-05-06 (×11): qty 1

## 2019-05-06 NOTE — Progress Notes (Signed)
Dr. Donzetta Matters at bedside to evaluate pt. Noted LLE with old incision from prior surgery, healing scar(various stages). Noted L foot dorsal pedis pulse not palpable. All five toes have a dusky appearance and are cool. Pt c/o pain rated 9/10. PCA set up/iniated per MD order. Per pharmacy  hep gtt to start on   05/06/19 at 08:00am. See mar for clarification.  Dayshift nurse updated.

## 2019-05-06 NOTE — Progress Notes (Signed)
Pt is c/o severe pain to left foot, no relief from Dilaudid PCA. Dr Donzetta Matters notified, pain meds adjusted. 1600 Pt's pain control is better this afternoon.

## 2019-05-06 NOTE — Progress Notes (Signed)
ANTICOAGULATION CONSULT NOTE  Pharmacy Consult for Heparin Indication: VTE treatment  No Known Allergies  Patient Measurements: Height: 6\' 5"  (195.6 cm) Weight: 285 lb 0.9 oz (129.3 kg) IBW/kg (Calculated) : 89.1 Heparin Dosing Weight: 116.7 kg  Vital Signs: Temp: 98.4 F (36.9 C) (09/12 1431) Temp Source: Oral (09/12 1431) BP: 143/98 (09/12 1431) Pulse Rate: 89 (09/12 1431)  Labs: Recent Labs    05/05/19 2055 05/06/19 1513  HGB 14.9  --   HCT 45.3  --   PLT 296  --   APTT  --  88*  LABPROT 15.2  --   INR 1.2  --   HEPARINUNFRC  --  0.91*  CREATININE 1.04  --     Estimated Creatinine Clearance: 126.4 mL/min (by C-G formula based on SCr of 1.04 mg/dL).   Medical History: Past Medical History:  Diagnosis Date  . Hypertension   . PVD (peripheral vascular disease) (HCC)     Assessment: 48 y.o.malewith ischemic toes on the left foot and severe pain.  Patient was discharged on Plavix and Xarelto on 9/5 for bypass and angioplasty of the posteriori tibial artery.  Pharmacy is consulted to start systemic anticoagulation with a heparin drip.  Patient took his last dose of Xarelto in the am on 9/11.  Systemic anticoagulation should be initiated 24 hours after the last rivaroxaban dose.  Started heparin drip at 0800 on 9/12.   Will initially need to monitor aPTT and HL due to the presence of the rivaroxaban in the patient's system.  Initial heparin level falsely elevated (HL 0.91) given recent Xarelto, aPTT came back therapeutic at 88. Hgb 14.9, plt 296. No s/sx of bleeding or infusion issues.   Goal of Therapy:  Heparin level 0.3-0.7 units/ml  Target aPTT 66 - 102 secs Monitor platelets by anticoagulation protocol: Yes   Plan:  Continue heparin gtt at 1850 units/hr Monitor qam aPTT and HL and CBC while on therapy  Antonietta Jewel, PharmD, BCCCP Clinical Pharmacist  Phone: [phone]  Please check AMION for all [place] phone numbers After 10:00 PM, call [place] [phone] 05/06/2019, 3:58 PM

## 2019-05-06 NOTE — Plan of Care (Signed)
  Problem: Pain Managment: Goal: General experience of comfort will improve Outcome: Progressing   

## 2019-05-06 NOTE — Progress Notes (Signed)
  Progress Note    05/06/2019 11:20 AM * No surgery found *  Subjective: Having left-sided toe pain of all toes  Vitals:   05/06/19 0745 05/06/19 0821  BP: (!) 159/111 (!) 155/101  Pulse: 73 68  Resp: 16   Temp: 98.3 F (36.8 C)   SpO2: 100%     Physical Exam: Awake alert oriented On the respirations Abdomen is soft He has a palpable left popliteal pulse Foot is warm toes have minimal motor but sensation is intact  CBC    Component Value Date/Time   WBC 9.3 05/05/2019 2055   RBC 5.62 05/05/2019 2055   HGB 14.9 05/05/2019 2055   HCT 45.3 05/05/2019 2055   PLT 296 05/05/2019 2055   MCV 80.6 05/05/2019 2055   MCH 26.5 05/05/2019 2055   MCHC 32.9 05/05/2019 2055   RDW 15.2 05/05/2019 2055   LYMPHSABS 4.6 (H) 04/27/2019 2140   MONOABS 0.7 04/27/2019 2140   EOSABS 0.3 04/27/2019 2140   BASOSABS 0.1 04/27/2019 2140    BMET    Component Value Date/Time   NA 137 05/05/2019 2055   K 3.8 05/05/2019 2055   CL 103 05/05/2019 2055   CO2 21 (L) 05/05/2019 2055   GLUCOSE 143 (H) 05/05/2019 2055   BUN <5 (L) 05/05/2019 2055   CREATININE 1.04 05/05/2019 2055   CALCIUM 9.1 05/05/2019 2055   GFRNONAA >60 05/05/2019 2055   GFRAA >60 05/05/2019 2055    INR    Component Value Date/Time   INR 1.2 05/05/2019 2055     Intake/Output Summary (Last 24 hours) at 05/06/2019 1120 Last data filed at 05/06/2019 0900 Gross per 24 hour  Intake 933 ml  Output 650 ml  Net 283 ml     Assessment/plan:  48 y.o. male is s/p intervention of left lower extremity thrombosed bypass.  Bypass appears patent by physical exam with a warm foot and a palpable popliteal pulse.  We will get a duplex and ABIs today.  Likely will need repeat angiogram to evaluate foot flow possibly going to need transmetatarsal amputation versus below-knee amputation for pain control.  Donda Friedli C. Donzetta Matters, MD [facility] [place] Office: [phone] Pager: [phone]  05/06/2019 11:20 AM

## 2019-05-06 NOTE — Progress Notes (Signed)
VASCULAR LAB PRELIMINARY  PRELIMINARY  PRELIMINARY  PRELIMINARY  ABIs  completed.    Preliminary report:  See CV proc for preliminary results.   Yalissa Fink, RVT 05/06/2019, 7:10 PM

## 2019-05-07 LAB — APTT: aPTT: 101 seconds — ABNORMAL HIGH (ref 24–36)

## 2019-05-07 LAB — CBC
HCT: 44.7 % (ref 39.0–52.0)
Hemoglobin: 14.5 g/dL (ref 13.0–17.0)
MCH: 26.7 pg (ref 26.0–34.0)
MCHC: 32.4 g/dL (ref 30.0–36.0)
MCV: 82.2 fL (ref 80.0–100.0)
Platelets: 301 10*3/uL (ref 150–400)
RBC: 5.44 MIL/uL (ref 4.22–5.81)
RDW: 15.6 % — ABNORMAL HIGH (ref 11.5–15.5)
WBC: 9.2 10*3/uL (ref 4.0–10.5)
nRBC: 0 % (ref 0.0–0.2)

## 2019-05-07 LAB — HEPARIN LEVEL (UNFRACTIONATED): Heparin Unfractionated: 0.94 IU/mL — ABNORMAL HIGH (ref 0.30–0.70)

## 2019-05-07 LAB — SARS CORONAVIRUS 2 (TAT 6-24 HRS): SARS Coronavirus 2: NEGATIVE

## 2019-05-07 NOTE — Plan of Care (Signed)

## 2019-05-07 NOTE — Plan of Care (Signed)

## 2019-05-07 NOTE — Progress Notes (Signed)
ANTICOAGULATION CONSULT NOTE  Pharmacy Consult for Heparin Indication: VTE treatment  No Known Allergies  Patient Measurements: Height: 6\' 5"  (195.6 cm) Weight: 285 lb 0.9 oz (129.3 kg) IBW/kg (Calculated) : 89.1 Heparin Dosing Weight: 116.7 kg  Vital Signs: Temp: 98.4 F (36.9 C) (09/13 0459) Temp Source: Oral (09/13 0459) BP: 137/99 (09/13 0459) Pulse Rate: 92 (09/13 0459)  Labs: Recent Labs    05/05/19 2055 05/06/19 1513 05/07/19 0351  HGB 14.9  --  14.5  HCT 45.3  --  44.7  PLT 296  --  301  APTT  --  88* 101*  LABPROT 15.2  --   --   INR 1.2  --   --   HEPARINUNFRC  --  0.91* 0.94*  CREATININE 1.04  --   --     Estimated Creatinine Clearance: 126.4 mL/min (by C-G formula based on SCr of 1.04 mg/dL).   Medical History: Past Medical History:  Diagnosis Date  . Hypertension   . PVD (peripheral vascular disease) (HCC)     Assessment: 48 y.o.malewith ischemic toes on the left foot and severe pain.  Patient was discharged on Plavix and Xarelto on 9/5 for bypass and angioplasty of the posteriori tibial artery.  Pharmacy is consulted to start systemic anticoagulation with a heparin drip. Patient took his last dose of Xarelto in the am on 9/11.  Systemic anticoagulation should be initiated 24 hours after the last rivaroxaban dose.  Started heparin drip at 0800 on 9/12. Will initially need to monitor aPTT and HL due to the presence of the rivaroxaban in the patient's system.  Initial heparin level falsely elevated (HL 0.91) given recent Xarelto, aPTT came back therapeutic at 88 yesterday afternoon.   Today it aPTT returned 101 with heparin level also elevated at 0.91; HL falsely elevated and aPTT within therapeutic window.  Hgb 14.5, plt 301. No s/sx of bleeding or infusion issues.   Goal of Therapy:  Heparin level 0.3-0.7 units/ml  Target aPTT 66 - 102 secs Monitor platelets by anticoagulation protocol: Yes   Plan:  Continue heparin gtt at 1850  units/hr Monitor qam aPTT and HL and CBC while on therapy  Thank you for the interesting consult and for involving pharmacy in this patient's care.  Tamela Gammon, PharmD 05/07/2019 7:28 AM PGY-2 Pharmacy Administration Resident Direct Phone: [phone] Please check [email or web address] for unit-specific pharmacist phone numbers

## 2019-05-07 NOTE — Progress Notes (Signed)
  Progress Note    05/07/2019 10:49 AM   Subjective: Still having pain in all of his left toes  Vitals:   05/07/19 0837 05/07/19 0844  BP:    Pulse:    Resp: 16 18  Temp:    SpO2: 99% 99%    Physical Exam: Awake alert oriented Nonlabored respirations On the left he does have a palpable popliteal pulse in his foot is warm although toes are exquisitely tender to palpation  CBC    Component Value Date/Time   WBC 9.2 05/07/2019 0351   RBC 5.44 05/07/2019 0351   HGB 14.5 05/07/2019 0351   HCT 44.7 05/07/2019 0351   PLT 301 05/07/2019 0351   MCV 82.2 05/07/2019 0351   MCH 26.7 05/07/2019 0351   MCHC 32.4 05/07/2019 0351   RDW 15.6 (H) 05/07/2019 0351   LYMPHSABS 4.6 (H) 04/27/2019 2140   MONOABS 0.7 04/27/2019 2140   EOSABS 0.3 04/27/2019 2140   BASOSABS 0.1 04/27/2019 2140    BMET    Component Value Date/Time   NA 137 05/05/2019 2055   K 3.8 05/05/2019 2055   CL 103 05/05/2019 2055   CO2 21 (L) 05/05/2019 2055   GLUCOSE 143 (H) 05/05/2019 2055   BUN <5 (L) 05/05/2019 2055   CREATININE 1.04 05/05/2019 2055   CALCIUM 9.1 05/05/2019 2055   GFRNONAA >60 05/05/2019 2055   GFRAA >60 05/05/2019 2055    INR    Component Value Date/Time   INR 1.2 05/05/2019 2055     Intake/Output Summary (Last 24 hours) at 05/07/2019 1049 Last data filed at 05/07/2019 0900 Gross per 24 hour  Intake 1275.74 ml  Output 1000 ml  Net 275.74 ml     Assessment/plan:  48 y.o. male is s/p lysis of thrombosed bypass and balloon angioplasty.  I reviewed duplex and ABIs which demonstrate threatened bypass graft with significantly decreased velocities proximally.  He does have a palpable left popliteal pulse.  We will plan to perform lower extremity angiography with possible intervention tomorrow.  Unfortunately it appears he is likely thrombosed the small vessels of his toes and will likely require at least transmetatarsal amputation during this hospitalization for pain relief.  Will  evaluate with angiography tomorrow plan amputation accordingly.   Brandon C. Donzetta Matters, MD [facility] [place] Office: [phone] Pager: [phone]  05/07/2019 10:49 AM

## 2019-05-07 NOTE — Plan of Care (Signed)
  Problem: Education: Goal: Knowledge of General Education information will improve Description: Including pain rating scale, medication(s)/side effects and non-pharmacologic comfort measures Outcome: Progressing   Problem: Clinical Measurements: Goal: Ability to maintain clinical measurements within normal limits will improve Outcome: Progressing   

## 2019-05-08 ENCOUNTER — Encounter (HOSPITAL_COMMUNITY)
Admission: AD | Disposition: A | Discharge status: Rehabilitation Facility | Payer: Self-pay | Source: Ambulatory Visit | Attending: Vascular Surgery

## 2019-05-08 ENCOUNTER — Encounter (HOSPITAL_COMMUNITY): Payer: Self-pay | Admitting: *Deleted

## 2019-05-08 DIAGNOSIS — I70262 Atherosclerosis of native arteries of extremities with gangrene, left leg: Secondary | ICD-10-CM

## 2019-05-08 HISTORY — PX: PERIPHERAL VASCULAR BALLOON ANGIOPLASTY: CATH118281

## 2019-05-08 HISTORY — PX: ABDOMINAL AORTOGRAM W/LOWER EXTREMITY: CATH118223

## 2019-05-08 HISTORY — PX: PERIPHERAL VASCULAR THROMBECTOMY: CATH118306

## 2019-05-08 LAB — BASIC METABOLIC PANEL
Anion gap: 8 (ref 5–15)
BUN: 7 mg/dL (ref 6–20)
CO2: 30 mmol/L (ref 22–32)
Calcium: 9.2 mg/dL (ref 8.9–10.3)
Chloride: 101 mmol/L (ref 98–111)
Creatinine, Ser: 0.9 mg/dL (ref 0.61–1.24)
GFR calc Af Amer: >60 mL/min (ref >60)
GFR calc non Af Amer: >60 mL/min (ref >60)
Glucose, Bld: 109 mg/dL — ABNORMAL HIGH (ref 70–99)
Potassium: 4 mmol/L (ref 3.5–5.1)
Sodium: 139 mmol/L (ref 135–145)

## 2019-05-08 LAB — CBC
HCT: 45.1 % (ref 39.0–52.0)
Hemoglobin: 14.5 g/dL (ref 13.0–17.0)
MCH: 26.7 pg (ref 26.0–34.0)
MCHC: 32.2 g/dL (ref 30.0–36.0)
MCV: 82.9 fL (ref 80.0–100.0)
Platelets: 301 10*3/uL (ref 150–400)
RBC: 5.44 MIL/uL (ref 4.22–5.81)
RDW: 15.3 % (ref 11.5–15.5)
WBC: 8.8 10*3/uL (ref 4.0–10.5)
nRBC: 0 % (ref 0.0–0.2)

## 2019-05-08 LAB — HEPARIN LEVEL (UNFRACTIONATED): Heparin Unfractionated: 0.64 IU/mL (ref 0.30–0.70)

## 2019-05-08 LAB — APTT: aPTT: 84 seconds — ABNORMAL HIGH (ref 24–36)

## 2019-05-08 SURGERY — ABDOMINAL AORTOGRAM W/LOWER EXTREMITY
Anesthesia: LOCAL

## 2019-05-08 MED ORDER — HEPARIN (PORCINE) IN NACL 1000-0.9 UT/500ML-% IV SOLN
INTRAVENOUS | Status: DC | PRN
  Administered 2019-05-08 (×2): 500 mL

## 2019-05-08 MED ORDER — LIDOCAINE HCL (PF) 1 % IJ SOLN
INTRAMUSCULAR | Status: AC
  Filled 2019-05-08: qty 30

## 2019-05-08 MED ORDER — SODIUM CHLORIDE 0.9% FLUSH: 3.0000 mL | INTRAVENOUS | Status: DC | PRN

## 2019-05-08 MED ORDER — LABETALOL HCL 5 MG/ML IV SOLN: 10.0000 mg | INTRAVENOUS | Status: DC | PRN

## 2019-05-08 MED ORDER — MORPHINE SULFATE (PF) 2 MG/ML IV SOLN: 2.0000 mg | INTRAVENOUS | Status: DC | PRN

## 2019-05-08 MED ORDER — SODIUM CHLORIDE 0.9 % IV SOLN
INTRAVENOUS | Status: DC
  Administered 2019-05-08 – 2019-05-09 (×3): via INTRAVENOUS

## 2019-05-08 MED ORDER — ONDANSETRON HCL 4 MG/2ML IJ SOLN: 4.0000 mg | Freq: Four times a day (QID) | INTRAMUSCULAR | Status: DC | PRN

## 2019-05-08 MED ORDER — LIDOCAINE HCL (PF) 1 % IJ SOLN
INTRAMUSCULAR | Status: DC | PRN
  Administered 2019-05-08: 18 mL via INTRADERMAL

## 2019-05-08 MED ORDER — SODIUM CHLORIDE 0.9% FLUSH
3.0000 mL | Freq: Two times a day (BID) | INTRAVENOUS | Status: DC
  Administered 2019-05-08: 3 mL via INTRAVENOUS

## 2019-05-08 MED ORDER — MIDAZOLAM HCL 2 MG/2ML IJ SOLN
INTRAMUSCULAR | Status: DC | PRN
  Administered 2019-05-08 (×2): 1 mg via INTRAVENOUS

## 2019-05-08 MED ORDER — IODIXANOL 320 MG/ML IV SOLN
INTRAVENOUS | Status: DC | PRN
  Administered 2019-05-08: 40 mL via INTRA_ARTERIAL

## 2019-05-08 MED ORDER — SODIUM CHLORIDE 0.9 % IV SOLN: INTRAVENOUS | Status: AC

## 2019-05-08 MED ORDER — SODIUM CHLORIDE 0.9 % IV SOLN: 250.0000 mL | INTRAVENOUS | Status: DC | PRN

## 2019-05-08 MED ORDER — HEPARIN (PORCINE) 25000 UT/250ML-% IV SOLN
1950.0000 [IU]/h | INTRAVENOUS | Status: DC
  Administered 2019-05-08: 1850 [IU]/h via INTRAVENOUS
  Filled 2019-05-08: qty 250

## 2019-05-08 MED ORDER — ACETAMINOPHEN 325 MG PO TABS: 650.0000 mg | ORAL_TABLET | ORAL | Status: DC | PRN

## 2019-05-08 MED ORDER — HYDRALAZINE HCL 20 MG/ML IJ SOLN: 5.0000 mg | INTRAMUSCULAR | Status: DC | PRN

## 2019-05-08 MED ORDER — HEPARIN SODIUM (PORCINE) 1000 UNIT/ML IJ SOLN
INTRAMUSCULAR | Status: AC
  Filled 2019-05-08: qty 2

## 2019-05-08 MED ORDER — MIDAZOLAM HCL 2 MG/2ML IJ SOLN
INTRAMUSCULAR | Status: AC
  Filled 2019-05-08: qty 2

## 2019-05-08 MED ORDER — OXYCODONE HCL 5 MG PO TABS
5.0000 mg | ORAL_TABLET | ORAL | Status: DC | PRN
  Administered 2019-05-08 – 2019-05-09 (×3): 5 mg via ORAL
  Filled 2019-05-08 (×3): qty 1

## 2019-05-08 MED ORDER — HEPARIN (PORCINE) IN NACL 1000-0.9 UT/500ML-% IV SOLN
INTRAVENOUS | Status: AC
  Filled 2019-05-08: qty 1000

## 2019-05-08 MED ORDER — HEPARIN SODIUM (PORCINE) 1000 UNIT/ML IJ SOLN
INTRAMUSCULAR | Status: DC | PRN
  Administered 2019-05-08: 12000 [IU] via INTRAVENOUS

## 2019-05-08 SURGICAL SUPPLY — 22 items
BALLN STERLING OTW 3X220X150 (BALLOONS) ×3
BALLOON STERLING OTW 3X220X150 (BALLOONS) ×2 IMPLANT
CANISTER PENUMBRA ENGINE (MISCELLANEOUS) ×3 IMPLANT
CATH CXI SUPP 2.6F 150 ST (CATHETERS) ×3 IMPLANT
CATH INDIGO 3 ST-TIP 150CM (CATHETERS) ×3 IMPLANT
CATH INDIGO CAT6 KIT (CATHETERS) ×3 IMPLANT
CATH OMNI FLUSH 5F 65CM (CATHETERS) ×3 IMPLANT
CATH TEMPO AQUA 5F 100CM (CATHETERS) ×3 IMPLANT
CLOSURE MYNX CONTROL 6F/7F (Vascular Products) ×3 IMPLANT
GLIDEWIRE ADV .035X260CM (WIRE) ×3 IMPLANT
KIT ENCORE 26 ADVANTAGE (KITS) ×3 IMPLANT
KIT MICROPUNCTURE NIT STIFF (SHEATH) ×3 IMPLANT
KIT PV (KITS) ×3 IMPLANT
SHEATH PINNACLE 5F 10CM (SHEATH) ×3 IMPLANT
SHEATH PINNACLE 6F 10CM (SHEATH) ×3 IMPLANT
SHEATH PROBE COVER 6X72 (BAG) ×3 IMPLANT
SHEATH SHUTTLE SELECT 6F (SHEATH) ×3 IMPLANT
SYR MEDRAD MARK V 150ML (SYRINGE) ×3 IMPLANT
TRANSDUCER W/STOPCOCK (MISCELLANEOUS) ×3 IMPLANT
TRAY PV CATH (CUSTOM PROCEDURE TRAY) ×3 IMPLANT
WIRE BENTSON .035X145CM (WIRE) ×3 IMPLANT
WIRE G V18X300CM (WIRE) ×3 IMPLANT

## 2019-05-08 NOTE — Op Note (Addendum)
    Patient name: Sean [name] MRN: [id number] DOB: 1971-01-11 Sex: male  05/08/2019 Pre-operative Diagnosis: Acute left lower extremity ischemia Post-operative diagnosis:  Same Surgeon:  Erlene Quan C. Donzetta Matters, MD Procedure Performed: 1.  Ultrasound-guided cannulation right common femoral artery 2.  Left lower extremity angiogram 3.  Penumbra thrombectomy left bypass and posterior tibial artery with CAT 6 and CAT 3 catheters 4.  Balloon angioplasty left posterior tibial artery with 3 mm balloon 5.  Moderate sedation with fentanyl and Versed for 73 minutes 6.  Mynx device closure right common femoral artery  Indications: 48 year old male with a left above-knee to posterior tibial artery bypass that was performed emergently less than 1 year ago.  He recently thrombosed his bypass underwent lysis now has rest pain again and duplex demonstrates patency of his bypass.  He is indicated for repeat angiogram.  Findings: Posterior tibial artery distally was occluded.  This appeared to likely be subacute thrombus as wires and catheters passed easily.  I attempted penumbra thrombectomy initially with CAT6 but it was too short to reach all the way down I then used the CAT3 device but could only returned minimal clot. I have offered to take patient to the operating room for open thrombectomy.  At this time he desires no further procedures today I will plan for possible open thrombectomy versus primary below-knee amputation tomorrow in the operating room.  I will allow him to eat today he can be n.p.o. past midnight.  Procedure:  The patient was identified in the holding area and taken to room 8.  The patient was then placed supine on the table and prepped and draped in the usual sterile fashion.  A time out was called.  Ultrasound was used to evaluate the right common femoral artery which was noted to be patent.  The area was anesthetized 1% lidocaine cannulated micropuncture needle with direct ultrasound  visualization.  An image was saved the permanent record.  I placed a micropuncture sheath followed by 5 French sheath over a Bentson wire.  I then crossed the bifurcation using Omni catheter and Glidewire advantage.  I placed the Omni catheter down into the left SFA then used straight catheter to level the bypass from left lower extremity angiography.  Patient was fully heparinized and a long 6 French sheath was placed.  We crossed using V 18 and CXI catheter all the way to the level of the foot.  We confirmed intraluminal access.  I then used penumbra initially CAT 6 but then had to switch to CAT3 given the patient's height.  We were able to return minimal clot ultimately could not get the vessel open.  Attempted balloon angioplasty had no further runoff.  I also gave nitroglycerin 100 mcg this also did not open up the vessel.  Arm of the wire performed angiography vessel was still occluded.  I elected to terminate the procedure given the patient is undergone lysis in the past I did not elect to re-lyse him overnight.  I will consider him for open thrombectomy or primary below-knee amputation tomorrow.  Allow him to eat tonight he can be n.p.o. past midnight.  We exchanged for short 6 French sheath deployed a minx device which deployed well.  He tolerated procedure well without immediate complication  Contrast: 40 cc  Brandon C. Donzetta Matters, MD [facility] Office: [phone] Pager: [phone]

## 2019-05-08 NOTE — Progress Notes (Signed)
ANTICOAGULATION CONSULT NOTE  Pharmacy Consult for Heparin Indication: VTE treatment  No Known Allergies  Patient Measurements: Height: 6\' 5"  (195.6 cm) Weight: 285 lb 0.9 oz (129.3 kg) IBW/kg (Calculated) : 89.1 Heparin Dosing Weight: 116.7 kg  Vital Signs: Temp: 97.7 F (36.5 C) (09/14 0342) Temp Source: Oral (09/14 0342) BP: 152/109 (09/14 0342) Pulse Rate: 88 (09/14 0342)  Labs: Recent Labs    05/05/19 2055 05/06/19 1513 05/07/19 0351 05/08/19 0451  HGB 14.9  --  14.5 14.5  HCT 45.3  --  44.7 45.1  PLT 296  --  301 301  APTT  --  88* 101* 84*  LABPROT 15.2  --   --   --   INR 1.2  --   --   --   HEPARINUNFRC  --  0.91* 0.94* 0.64  CREATININE 1.04  --   --  0.90    Estimated Creatinine Clearance: 146.1 mL/min (by C-G formula based on SCr of 0.9 mg/dL).   Medical History: Past Medical History:  Diagnosis Date  . Hypertension   . PVD (peripheral vascular disease) (HCC)     Assessment: 48 y.o.malewith ischemic toes on the left foot and severe pain.  Patient was discharged on Plavix and Xarelto on 9/5 for bypass and angioplasty of the posteriori tibial artery.  Pharmacy is consulted to start systemic anticoagulation with a heparin drip. Patient took his last dose of Xarelto in the am on 9/11.  Systemic anticoagulation should be initiated 24 hours after the last rivaroxaban dose.  Started heparin drip at 0800 on 9/12. Will initially need to monitor aPTT and HL due to the presence of the rivaroxaban in the patient's system.  Today aPTT returned 84 with heparin level at 0.64; aPTT and heparin level both within therapeutic window (heparin level close to correlating with aPTT).  Hgb 14.5, plt 301. No s/sx of bleeding or infusion issues.   Goal of Therapy:  Heparin level 0.3-0.7 units/ml  Target aPTT 66 - 102 secs Monitor platelets by anticoagulation protocol: Yes   Plan:  Continue heparin gtt at 1850 units/hr Monitor qam  HL and CBC while on therapy  Rhythm Wigfall  A. Levada Dy, PharmD, BCPS, FNKF Clinical Pharmacist [place] Please utilize Am[facility] for appropriate phone number to reach the unit pharmacist ([place])

## 2019-05-08 NOTE — Progress Notes (Signed)
[place] for Heparin Indication: acute limb ischemia  No Known Allergies  Patient Measurements: Height: 6\' 5"  (195.6 cm) Weight: 285 lb 0.9 oz (129.3 kg) IBW/kg (Calculated) : 89.1 Heparin Dosing Weight: 116.7 kg  Vital Signs: Temp: 98.1 F (36.7 C) (09/14 0923) Temp Source: Oral (09/14 0923) BP: 130/100 (09/14 1608) Pulse Rate: 91 (09/14 1608)  Labs: Recent Labs    05/05/19 2055 05/06/19 1513 05/07/19 0351 05/08/19 0451  HGB 14.9  --  14.5 14.5  HCT 45.3  --  44.7 45.1  PLT 296  --  301 301  APTT  --  88* 101* 84*  LABPROT 15.2  --   --   --   INR 1.2  --   --   --   HEPARINUNFRC  --  0.91* 0.94* 0.64  CREATININE 1.04  --   --  0.90    Estimated Creatinine Clearance: 146.1 mL/min (by C-G formula based on SCr of 0.9 mg/dL).   Medical History: Past Medical History:  Diagnosis Date  . Hypertension   . PVD (peripheral vascular disease) (HCC)     Assessment: 48 y.o.malewith ischemic toes on the left foot and severe pain.  Patient was discharged on Plavix and Xarelto on 9/5 for bypass and angioplasty of the posteriori tibial artery.  Patient took his last dose of Xarelto in the am on 9/11.  He is now s/p cath (failed balloon angioplasty and thrombectomy) and to restart heparin 4 hrs post sheath (removed ~ 3:45pm). Plans are for open thrombectomy or primary below-knee amputation on 9/15   Goal of Therapy:  Heparin level 0.3-0.7 units/ml  Monitor platelets by anticoagulation protocol: Yes   Plan:  -restart heparin at 8pm at 1850 units/hr -Heparin level in 6 hours and daily wth CBC daily  Hildred Laser, PharmD Clinical Pharmacist **Pharmacist phone directory can now be found on [email or web address] ([email or web address]).  Listed under [place].

## 2019-05-09 ENCOUNTER — Encounter (HOSPITAL_COMMUNITY)
Admission: AD | Disposition: A | Discharge status: Rehabilitation Facility | Payer: Self-pay | Source: Ambulatory Visit | Attending: Vascular Surgery

## 2019-05-09 ENCOUNTER — Inpatient Hospital Stay (HOSPITAL_COMMUNITY): Payer: BLUE CROSS/BLUE SHIELD | Admitting: Certified Registered Nurse Anesthetist

## 2019-05-09 ENCOUNTER — Encounter (HOSPITAL_COMMUNITY): Payer: Self-pay | Admitting: Vascular Surgery

## 2019-05-09 DIAGNOSIS — I70262 Atherosclerosis of native arteries of extremities with gangrene, left leg: Secondary | ICD-10-CM

## 2019-05-09 HISTORY — PX: TRANSMETATARSAL AMPUTATION: SHX6197

## 2019-05-09 LAB — SURGICAL PCR SCREEN
MRSA, PCR: NEGATIVE
Staphylococcus aureus: NEGATIVE

## 2019-05-09 LAB — CBC
HCT: 40 % (ref 39.0–52.0)
Hemoglobin: 13.4 g/dL (ref 13.0–17.0)
MCH: 26.9 pg (ref 26.0–34.0)
MCHC: 33.5 g/dL (ref 30.0–36.0)
MCV: 80.3 fL (ref 80.0–100.0)
Platelets: 269 10*3/uL (ref 150–400)
RBC: 4.98 MIL/uL (ref 4.22–5.81)
RDW: 15 % (ref 11.5–15.5)
WBC: 9.3 10*3/uL (ref 4.0–10.5)
nRBC: 0 % (ref 0.0–0.2)

## 2019-05-09 LAB — BASIC METABOLIC PANEL
Anion gap: 11 (ref 5–15)
BUN: 8 mg/dL (ref 6–20)
CO2: 25 mmol/L (ref 22–32)
Calcium: 9 mg/dL (ref 8.9–10.3)
Chloride: 102 mmol/L (ref 98–111)
Creatinine, Ser: 0.79 mg/dL (ref 0.61–1.24)
GFR calc Af Amer: >60 mL/min (ref >60)
GFR calc non Af Amer: >60 mL/min (ref >60)
Glucose, Bld: 117 mg/dL — ABNORMAL HIGH (ref 70–99)
Potassium: 4 mmol/L (ref 3.5–5.1)
Sodium: 138 mmol/L (ref 135–145)

## 2019-05-09 LAB — HEPARIN LEVEL (UNFRACTIONATED): Heparin Unfractionated: 0.28 IU/mL — ABNORMAL LOW (ref 0.30–0.70)

## 2019-05-09 SURGERY — AMPUTATION, FOOT, TRANSMETATARSAL
Anesthesia: Monitor Anesthesia Care | Site: Leg Lower | Laterality: Left

## 2019-05-09 MED ORDER — ONDANSETRON HCL 4 MG/2ML IJ SOLN
INTRAMUSCULAR | Status: AC
  Filled 2019-05-09: qty 4

## 2019-05-09 MED ORDER — DEXAMETHASONE SODIUM PHOSPHATE 10 MG/ML IJ SOLN
INTRAMUSCULAR | Status: DC | PRN
  Administered 2019-05-09: 5 mg via INTRAVENOUS

## 2019-05-09 MED ORDER — FENTANYL CITRATE (PF) 100 MCG/2ML IJ SOLN
50.0000 ug | Freq: Once | INTRAMUSCULAR | Status: AC
  Administered 2019-05-09: 12:00:00 50 ug via INTRAVENOUS

## 2019-05-09 MED ORDER — MAGNESIUM SULFATE 2 GM/50ML IV SOLN: 2.0000 g | Freq: Every day | INTRAVENOUS | Status: DC | PRN

## 2019-05-09 MED ORDER — SODIUM CHLORIDE 0.9 % IV SOLN
INTRAVENOUS | Status: DC
  Administered 2019-05-09 – 2019-05-12 (×3): via INTRAVENOUS

## 2019-05-09 MED ORDER — ACETAMINOPHEN 10 MG/ML IV SOLN: 1000.0000 mg | Freq: Once | INTRAVENOUS | Status: DC | PRN

## 2019-05-09 MED ORDER — HYDRALAZINE HCL 20 MG/ML IJ SOLN: 5.0000 mg | INTRAMUSCULAR | Status: DC | PRN

## 2019-05-09 MED ORDER — MIDAZOLAM HCL 2 MG/2ML IJ SOLN
2.0000 mg | Freq: Once | INTRAMUSCULAR | Status: AC
  Administered 2019-05-09: 12:00:00 2 mg via INTRAVENOUS

## 2019-05-09 MED ORDER — HEPARIN SODIUM (PORCINE) 5000 UNIT/ML IJ SOLN
5000.0000 [IU] | Freq: Three times a day (TID) | INTRAMUSCULAR | Status: DC
  Administered 2019-05-10 – 2019-05-16 (×18): 5000 [IU] via SUBCUTANEOUS
  Filled 2019-05-09 (×17): qty 1

## 2019-05-09 MED ORDER — ACETAMINOPHEN 160 MG/5ML PO SOLN: 1000.0000 mg | Freq: Once | ORAL | Status: DC | PRN

## 2019-05-09 MED ORDER — MIDAZOLAM HCL 2 MG/2ML IJ SOLN
INTRAMUSCULAR | Status: AC
  Filled 2019-05-09: qty 2

## 2019-05-09 MED ORDER — HEPARIN SODIUM (PORCINE) 1000 UNIT/ML IJ SOLN
INTRAMUSCULAR | Status: AC
  Filled 2019-05-09: qty 1

## 2019-05-09 MED ORDER — FENTANYL CITRATE (PF) 100 MCG/2ML IJ SOLN: 25.0000 ug | INTRAMUSCULAR | Status: DC | PRN

## 2019-05-09 MED ORDER — FENTANYL CITRATE (PF) 250 MCG/5ML IJ SOLN
INTRAMUSCULAR | Status: AC
  Filled 2019-05-09: qty 5

## 2019-05-09 MED ORDER — DEXTROSE 5 % IV SOLN
3.0000 g | Freq: Once | INTRAVENOUS | Status: AC
  Administered 2019-05-09: 3 g via INTRAVENOUS
  Filled 2019-05-09: qty 3

## 2019-05-09 MED ORDER — LACTATED RINGERS IV SOLN
INTRAVENOUS | Status: DC
  Administered 2019-05-09: 12:00:00 via INTRAVENOUS

## 2019-05-09 MED ORDER — GUAIFENESIN-DM 100-10 MG/5ML PO SYRP
15.0000 mL | ORAL_SOLUTION | ORAL | Status: DC | PRN
  Administered 2019-05-10: 15 mL via ORAL
  Filled 2019-05-09: qty 15

## 2019-05-09 MED ORDER — DEXAMETHASONE SODIUM PHOSPHATE 10 MG/ML IJ SOLN
INTRAMUSCULAR | Status: AC
  Filled 2019-05-09: qty 2

## 2019-05-09 MED ORDER — POTASSIUM CHLORIDE CRYS ER 20 MEQ PO TBCR: 20.0000 meq | EXTENDED_RELEASE_TABLET | Freq: Every day | ORAL | Status: DC | PRN

## 2019-05-09 MED ORDER — OXYCODONE HCL 5 MG PO TABS: 5.0000 mg | ORAL_TABLET | Freq: Once | ORAL | Status: DC | PRN

## 2019-05-09 MED ORDER — 0.9 % SODIUM CHLORIDE (POUR BTL) OPTIME
TOPICAL | Status: DC | PRN
  Administered 2019-05-09: 2000 mL

## 2019-05-09 MED ORDER — ONDANSETRON HCL 4 MG/2ML IJ SOLN
INTRAMUSCULAR | Status: DC | PRN
  Administered 2019-05-09: 4 mg via INTRAVENOUS

## 2019-05-09 MED ORDER — FENTANYL CITRATE (PF) 100 MCG/2ML IJ SOLN
INTRAMUSCULAR | Status: AC
  Administered 2019-05-09: 50 ug via INTRAVENOUS
  Filled 2019-05-09: qty 2

## 2019-05-09 MED ORDER — PROTAMINE SULFATE 10 MG/ML IV SOLN
INTRAVENOUS | Status: AC
  Filled 2019-05-09: qty 5

## 2019-05-09 MED ORDER — PROPOFOL 10 MG/ML IV BOLUS
INTRAVENOUS | Status: DC | PRN
  Administered 2019-05-09: 30 mg via INTRAVENOUS

## 2019-05-09 MED ORDER — MIDAZOLAM HCL 2 MG/2ML IJ SOLN
INTRAMUSCULAR | Status: AC
  Administered 2019-05-09: 2 mg via INTRAVENOUS
  Filled 2019-05-09: qty 2

## 2019-05-09 MED ORDER — ACETAMINOPHEN 500 MG PO TABS: 1000.0000 mg | ORAL_TABLET | Freq: Once | ORAL | Status: DC | PRN

## 2019-05-09 MED ORDER — LIDOCAINE-EPINEPHRINE (PF) 1.5 %-1:200000 IJ SOLN
INTRAMUSCULAR | Status: DC | PRN
  Administered 2019-05-09 (×2): 10 mL via PERINEURAL

## 2019-05-09 MED ORDER — BUPIVACAINE HCL (PF) 0.5 % IJ SOLN
INTRAMUSCULAR | Status: DC | PRN
  Administered 2019-05-09 (×2): 20 mL

## 2019-05-09 MED ORDER — CEFAZOLIN SODIUM-DEXTROSE 2-4 GM/100ML-% IV SOLN
2.0000 g | Freq: Three times a day (TID) | INTRAVENOUS | Status: AC
  Administered 2019-05-09 – 2019-05-10 (×2): 2 g via INTRAVENOUS
  Filled 2019-05-09 (×2): qty 100

## 2019-05-09 MED ORDER — OXYCODONE HCL 5 MG/5ML PO SOLN: 5.0000 mg | Freq: Once | ORAL | Status: DC | PRN

## 2019-05-09 MED ORDER — PROPOFOL 500 MG/50ML IV EMUL
INTRAVENOUS | Status: DC | PRN
  Administered 2019-05-09: 100 ug/kg/min via INTRAVENOUS

## 2019-05-09 MED ORDER — LABETALOL HCL 5 MG/ML IV SOLN: 10.0000 mg | INTRAVENOUS | Status: DC | PRN

## 2019-05-09 MED ORDER — PHENOL 1.4 % MT LIQD: 1.0000 | OROMUCOSAL | Status: DC | PRN

## 2019-05-09 SURGICAL SUPPLY — 40 items
BLADE AVERAGE 25X9 (BLADE) ×2 IMPLANT
BLADE SAW SGTL 81X20 HD (BLADE) IMPLANT
BNDG CONFORM 3 STRL LF (GAUZE/BANDAGES/DRESSINGS) IMPLANT
BNDG ELASTIC 4X5.8 VLCR STR LF (GAUZE/BANDAGES/DRESSINGS) ×3 IMPLANT
BNDG GAUZE ELAST 4 BULKY (GAUZE/BANDAGES/DRESSINGS) ×2 IMPLANT
CANISTER SUCT 3000ML PPV (MISCELLANEOUS) ×2 IMPLANT
COVER SURGICAL LIGHT HANDLE (MISCELLANEOUS) ×2 IMPLANT
COVER WAND RF STERILE (DRAPES) ×2 IMPLANT
CUFF TOURN SGL QUICK 24 (TOURNIQUET CUFF) ×1
CUFF TRNQT CYL 24X4X16.5-23 (TOURNIQUET CUFF) IMPLANT
DRAPE HALF SHEET 40X57 (DRAPES) ×2 IMPLANT
DRSG ADAPTIC 3X8 NADH LF (GAUZE/BANDAGES/DRESSINGS) ×2 IMPLANT
ELECT REM PT RETURN 9FT ADLT (ELECTROSURGICAL) ×2
ELECTRODE REM PT RTRN 9FT ADLT (ELECTROSURGICAL) ×1 IMPLANT
GAUZE SPONGE 4X4 12PLY STRL (GAUZE/BANDAGES/DRESSINGS) ×2 IMPLANT
GAUZE SPONGE 4X4 12PLY STRL LF (GAUZE/BANDAGES/DRESSINGS) ×1 IMPLANT
GLOVE BIO SURGEON STRL SZ7.5 (GLOVE) ×2 IMPLANT
GOWN STRL REUS W/ TWL LRG LVL3 (GOWN DISPOSABLE) ×2 IMPLANT
GOWN STRL REUS W/ TWL XL LVL3 (GOWN DISPOSABLE) ×1 IMPLANT
GOWN STRL REUS W/TWL LRG LVL3 (GOWN DISPOSABLE) ×2
GOWN STRL REUS W/TWL XL LVL3 (GOWN DISPOSABLE) ×1
KIT BASIN OR (CUSTOM PROCEDURE TRAY) ×2 IMPLANT
KIT TURNOVER KIT B (KITS) ×2 IMPLANT
NDL HYPO 25GX1X1/2 BEV (NEEDLE) IMPLANT
NEEDLE HYPO 25GX1X1/2 BEV (NEEDLE) IMPLANT
NS IRRIG 1000ML POUR BTL (IV SOLUTION) ×2 IMPLANT
PACK GENERAL/GYN (CUSTOM PROCEDURE TRAY) ×2 IMPLANT
PAD ARMBOARD 7.5X6 YLW CONV (MISCELLANEOUS) ×4 IMPLANT
SPECIMEN JAR SMALL (MISCELLANEOUS) ×2 IMPLANT
SUT ETHILON 3 0 PS 1 (SUTURE) ×1 IMPLANT
SUT SILK 2 0 (SUTURE) ×1
SUT SILK 2-0 18XBRD TIE 12 (SUTURE) IMPLANT
SUT VIC AB 2-0 CT1 18 (SUTURE) ×4 IMPLANT
SUT VIC AB 2-0 CT1 27 (SUTURE) ×1
SUT VIC AB 2-0 CT1 TAPERPNT 27 (SUTURE) IMPLANT
SYR CONTROL 10ML LL (SYRINGE) IMPLANT
TOWEL GREEN STERILE (TOWEL DISPOSABLE) ×4 IMPLANT
TOWEL GREEN STERILE FF (TOWEL DISPOSABLE) ×2 IMPLANT
UNDERPAD 30X30 (UNDERPADS AND DIAPERS) ×2 IMPLANT
WATER STERILE IRR 1000ML POUR (IV SOLUTION) ×2 IMPLANT

## 2019-05-09 NOTE — Anesthesia Preprocedure Evaluation (Signed)
Anesthesia Evaluation  Patient identified by MRN, date of birth, ID band Patient awake    Reviewed: Allergy & Precautions, NPO status , Patient's Chart, lab work & pertinent test results  History of Anesthesia Complications Negative for: history of anesthetic complications  Airway Mallampati: III  TM Distance: >3 FB Neck ROM: Full    Dental  (+) Dental Advisory Given   Pulmonary neg shortness of breath, neg recent URI, Current SmokerPatient did not abstain from smoking.,    breath sounds clear to auscultation       Cardiovascular hypertension, Pt. on medications (-) angina+ Peripheral Vascular Disease  (-) Past MI  Rhythm:Regular     Neuro/Psych negative neurological ROS     GI/Hepatic negative GI ROS, Neg liver ROS,   Endo/Other  negative endocrine ROS  Renal/GU negative Renal ROS     Musculoskeletal negative musculoskeletal ROS (+)   Abdominal   Peds  Hematology negative hematology ROS (+)   Anesthesia Other Findings   Reproductive/Obstetrics                             Anesthesia Physical Anesthesia Plan  ASA: III  Anesthesia Plan: MAC and Regional   Post-op Pain Management:    Induction:   PONV Risk Score and Plan: 0 and Treatment may vary due to age or medical condition and Propofol infusion  Airway Management Planned: Nasal Cannula  Additional Equipment:   Intra-op Plan:   Post-operative Plan:   Informed Consent: I have reviewed the patients History and Physical, chart, labs and discussed the procedure including the risks, benefits and alternatives for the proposed anesthesia with the patient or authorized representative who has indicated his/her understanding and acceptance.     Dental advisory given  Plan Discussed with: CRNA and Surgeon  Anesthesia Plan Comments:         Anesthesia Quick Evaluation

## 2019-05-09 NOTE — Progress Notes (Signed)
Inpatient Rehabilitation-Admissions Coordinator   Consult received. Will await therapy evaluations to determine if pt has IP Rehab needs.   Will follow.   Jhonnie Garner, OTR/L  Rehab Admissions Coordinator  [phone] 05/09/2019 5:03 PM

## 2019-05-09 NOTE — Progress Notes (Signed)
PCA dilaudid stopped per Henrine Screws, Rn  .  Attempted to waste in pyxis but unable to find last pull. Wasted med in Chief Operating Officer (10ml) with Ailene Rud, RN and Lourena Simmonds, RN.

## 2019-05-09 NOTE — Anesthesia Procedure Notes (Signed)
Anesthesia Regional Block: Popliteal block   Pre-Anesthetic Checklist: ,, timeout performed, Correct Patient, Correct Site, Correct Laterality, Correct Procedure, Correct Position, site marked, Risks and benefits discussed,  Surgical consent,  Pre-op evaluation,  At surgeon's request and post-op pain management  Laterality: Left and Lower  Prep: chloraprep       Needles:  Injection technique: Single-shot     Needle Length: 9cm  Needle Gauge: 22     Additional Needles: Arrow StimuQuik ECHO Echogenic Stimulating PNB Needle  Procedures:,,,, ultrasound used (permanent image in chart),,,,  Narrative:  Start time: 05/09/2019 11:44 AM End time: 05/09/2019 11:58 AM Injection made incrementally with aspirations every 5 mL.  Performed by: Personally  Anesthesiologist: Oleta Mouse, MD

## 2019-05-09 NOTE — Anesthesia Procedure Notes (Signed)
Anesthesia Regional Block: Femoral nerve block   Pre-Anesthetic Checklist: ,, timeout performed, Correct Patient, Correct Site, Correct Laterality, Correct Procedure, Correct Position, site marked, Risks and benefits discussed,  Surgical consent,  Pre-op evaluation,  At surgeon's request and post-op pain management  Laterality: Left and Lower  Prep: chloraprep       Needles:  Injection technique: Single-shot     Needle Length: 9cm  Needle Gauge: 22     Additional Needles: Arrow StimuQuik ECHO Echogenic Stimulating PNB Needle  Procedures:,,,, ultrasound used (permanent image in chart),,,,  Narrative:  Start time: 05/09/2019 11:44 AM End time: 05/09/2019 11:58 AM Injection made incrementally with aspirations every 5 mL.  Performed by: Personally  Anesthesiologist: Oleta Mouse, MD

## 2019-05-09 NOTE — Op Note (Signed)
    Patient name: Sean [name] MRN: [id number] DOB: 1971-08-06 Sex: male  05/09/2019 Pre-operative Diagnosis: Non-reconstructable left lower extremity vascular disease Post-operative diagnosis:  Same Surgeon:  Erlene Quan C. Donzetta Matters, MD Assistant: Arlee Muslim, PA Procedure Performed:  Left low knee amputation  Indications: 48 year old male has undergone multiple left lower extremity revascularizations including bypass followed by lysis followed by attempted mechanical thrombectomy today prior to this.  He is now indicated for below-knee amputation.  We discussed risk benefits alternatives and he agrees to proceed.  Findings: There is adequate bleeding in the wound bed to suggest healing potential.   Procedure:  The patient was identified in the holding area and taken to the operating room where is placed supine operative table.  MAC anesthesia was induced.  A block of been placed prior to presentation to the OR.  Antibiotics were administered.  Tourniquet was placed above the knee the leg was exsanguinated and tourniquet was inflated 250 mmHg.  We began with the two thirds one third incision.  We completed the incision down to the bone with electrocautery.  Tibia was transected with Gigli saw.  Fibula was divided with bone clipper.  Posterior flap was created with amputation knife.  All exposed blood vessels were clamped tourniquet was allowed down.  Nerve was pulled on tension divided sharply and tied off with Vicryl suture.  We obtain hemostasis we irrigated the wound.  Bone was smoothed with a rasp.  We reapproximated the fascia of the flaps with interrupted 2-0 Vicryl suture.  Staples were placed to level the skin.  A dry dressing was placed.  He was awakened anesthesia having tolerated procedure without immediate complication.  All counts were correct at completion.  EBL 50 cc  Brandon C. Donzetta Matters, MD [facility] Office: [phone] Pager: [phone]

## 2019-05-09 NOTE — Progress Notes (Signed)
  Progress Note    05/09/2019 11:30 AM Day of Surgery  Subjective: Still having left foot pain  Vitals:   05/09/19 0852 05/09/19 1025  BP:  (!) 140/110  Pulse:    Resp: 11   Temp:    SpO2:      Physical Exam: Awake alert oriented On the respirations Left foot is warm with exquisite pain in the toes  CBC    Component Value Date/Time   WBC 9.3 05/09/2019 0420   RBC 4.98 05/09/2019 0420   HGB 13.4 05/09/2019 0420   HCT 40.0 05/09/2019 0420   PLT 269 05/09/2019 0420   MCV 80.3 05/09/2019 0420   MCH 26.9 05/09/2019 0420   MCHC 33.5 05/09/2019 0420   RDW 15.0 05/09/2019 0420   LYMPHSABS 4.6 (H) 04/27/2019 2140   MONOABS 0.7 04/27/2019 2140   EOSABS 0.3 04/27/2019 2140   BASOSABS 0.1 04/27/2019 2140    BMET    Component Value Date/Time   NA 138 05/09/2019 0420   K 4.0 05/09/2019 0420   CL 102 05/09/2019 0420   CO2 25 05/09/2019 0420   GLUCOSE 117 (H) 05/09/2019 0420   BUN 8 05/09/2019 0420   CREATININE 0.79 05/09/2019 0420   CALCIUM 9.0 05/09/2019 0420   GFRNONAA >60 05/09/2019 0420   GFRAA >60 05/09/2019 0420    INR    Component Value Date/Time   INR 1.2 05/05/2019 2055     Intake/Output Summary (Last 24 hours) at 05/09/2019 1130 Last data filed at 05/09/2019 0900 Gross per 24 hour  Intake 2138.68 ml  Output 2200 ml  Net -61.32 ml     Assessment/plan:  49 y.o. male is s/p left lower extremity angiogram.  I have offered the patient open thrombectomy from posterior tibial approach versus below-knee amputation.  He is elected to below-knee amputation which we will proceed with in the OR today.  Consent signed.  Brandon C. Donzetta Matters, MD [facility] Office: [phone] Pager: [phone]  05/09/2019 11:30 AM

## 2019-05-09 NOTE — Progress Notes (Signed)
ANTICOAGULATION CONSULT NOTE  Pharmacy Consult for Heparin Indication: acute limb ischemia  No Known Allergies  Patient Measurements: Height: 6\' 5"  (195.6 cm) Weight: 285 lb 0.9 oz (129.3 kg) IBW/kg (Calculated) : 89.1 Heparin Dosing Weight: 116.7 kg  Vital Signs: Temp: 98.2 F (36.8 C) (09/15 0000) Temp Source: Oral (09/15 0000) BP: 154/84 (09/15 0257) Pulse Rate: 81 (09/15 0257)  Labs: Recent Labs    05/06/19 1513  05/07/19 0351 05/08/19 0451 05/09/19 0420  HGB  --    < > 14.5 14.5 13.4  HCT  --   --  44.7 45.1 40.0  PLT  --   --  301 301 269  APTT 88*  --  101* 84*  --   HEPARINUNFRC 0.91*  --  0.94* 0.64 0.28*  CREATININE  --   --   --  0.90  --    < > = values in this interval not displayed.    Estimated Creatinine Clearance: 146.1 mL/min (by C-G formula based on SCr of 0.9 mg/dL).   Medical History: Past Medical History:  Diagnosis Date  . Hypertension   . PVD (peripheral vascular disease) (HCC)     Assessment: 48 y.o.malewith ischemic toes on the left foot and severe pain.  Patient was discharged on Plavix and Xarelto on 9/5 for bypass and angioplasty of the posteriori tibial artery.  Patient took his last dose of Xarelto in the am on 9/11.  He is now s/p cath (failed balloon angioplasty and thrombectomy) and to restart heparin 4 hrs post sheath (removed ~ 3:45pm). Plans are for open thrombectomy or primary below-knee amputation on 9/15  9/15 AM update:  heparin level just below goal, no issues per RN  Goal of Therapy:  Heparin level 0.3-0.7 units/ml  Monitor platelets by anticoagulation protocol: Yes   Plan:  -Inc heparin to 1950 units/hr -1300 heparin level  Narda Bonds, PharmD, BCPS Clinical Pharmacist Phone: [phone]

## 2019-05-09 NOTE — Transfer of Care (Signed)
Immediate Anesthesia Transfer of Care Note  Patient: Sean [name]  Procedure(s) Performed: Left Below Knee Amputation (Left Leg Lower)  Patient Location: PACU  Anesthesia Type:MAC  Level of Consciousness: drowsy and patient cooperative  Airway & Oxygen Therapy: Patient Spontanous Breathing and Patient connected to nasal cannula oxygen  Post-op Assessment: Report given to RN, Post -op Vital signs reviewed and stable and Patient moving all extremities X 4  Post vital signs: Reviewed and stable  Last Vitals:  Vitals Value Taken Time  BP 120/72 05/09/19 1323  Temp    Pulse 78 05/09/19 1323  Resp 10 05/09/19 1324  SpO2 97 % 05/09/19 1323  Vitals shown include unvalidated device data.  Last Pain:  Vitals:   05/09/19 0852  TempSrc:   PainSc: 8       Patients Stated Pain Goal: 0 (18/56/31 4970)  Complications: No apparent anesthesia complications

## 2019-05-10 ENCOUNTER — Encounter (HOSPITAL_COMMUNITY): Payer: Self-pay | Admitting: Vascular Surgery

## 2019-05-10 LAB — CBC
HCT: 39.7 % (ref 39.0–52.0)
Hemoglobin: 13.6 g/dL (ref 13.0–17.0)
MCH: 27.5 pg (ref 26.0–34.0)
MCHC: 34.3 g/dL (ref 30.0–36.0)
MCV: 80.2 fL (ref 80.0–100.0)
Platelets: 272 10*3/uL (ref 150–400)
RBC: 4.95 MIL/uL (ref 4.22–5.81)
RDW: 14.8 % (ref 11.5–15.5)
WBC: 11.3 10*3/uL — ABNORMAL HIGH (ref 4.0–10.5)
nRBC: 0 % (ref 0.0–0.2)

## 2019-05-10 LAB — BASIC METABOLIC PANEL
Anion gap: 13 (ref 5–15)
BUN: 11 mg/dL (ref 6–20)
CO2: 19 mmol/L — ABNORMAL LOW (ref 22–32)
Calcium: 9.1 mg/dL (ref 8.9–10.3)
Chloride: 105 mmol/L (ref 98–111)
Creatinine, Ser: 0.87 mg/dL (ref 0.61–1.24)
GFR calc Af Amer: >60 mL/min (ref >60)
GFR calc non Af Amer: >60 mL/min (ref >60)
Glucose, Bld: 131 mg/dL — ABNORMAL HIGH (ref 70–99)
Potassium: 4.6 mmol/L (ref 3.5–5.1)
Sodium: 137 mmol/L (ref 135–145)

## 2019-05-10 NOTE — Care Management (Signed)
05-10-19 1415 CM received referral to speak with patient about financials. Patient had questions regarding previous hospitalization costs and now cost from this hospitalization. CM did make patient aware that the health system will assist him with developing a payment plan for this visit and the previous visit. Patient had concerns regarding disability. He had filed before and was denied- with the ability to re-file. CM did make patient aware that he can contact DSS to see if he is eligible for Medicaid. CM will continue to monitor for additional transition of care needs. Bethena Roys, RN,BSN Case Manager [phone]

## 2019-05-10 NOTE — Progress Notes (Signed)
MEWS 1 secondary to respiratory rate, sensor had become unhooked.

## 2019-05-10 NOTE — Plan of Care (Signed)

## 2019-05-10 NOTE — Progress Notes (Signed)
Patient's pain has been more controlled tonight. Patient is pushing PCA button as needed and to prevent pain from getting to a higher level.  I will keep monitoring patient.

## 2019-05-10 NOTE — Progress Notes (Signed)
Occupational Therapy Evaluation Patient Details Name: Sean PalmaClarence Kallman MRN: [id number] DOB: 10/07/1970 Today's Date: 05/10/2019    History of Present Illness 48 year old male has undergone multiple left lower extremity revascularizations including bypass followed by lysis followed by attempted mechanical thrombectomy today prior to this.  S/p left BKA   Clinical Impression   PTA, pt was living at home with his wife, dad, and children, pt reports he was independent with ADL/IADL and functional mobility with use of RW recently for pain management after previous LLE procedures above. Pt s/p LLE BKA, currently requires modA for sit<>stand and minA for stand-pivot transfer to recliner. Educated pt on importance of positioning, elevation, and wrapping residual limb. Educated pt on grieving process associated with amputations and phantom limb sensation and pain. Educated pt on desensitization strategies as well. Due to decline in current level of function, pt would benefit from acute OT to address established goals to facilitate safe D/C to venue listed below. At this time, recommend CIR follow-up. Will continue to follow acutely.     Follow Up Recommendations  CIR    Equipment Recommendations  3 in 1 bedside commode    Recommendations for Other Services       Precautions / Restrictions Precautions Precautions: Fall Restrictions Weight Bearing Restrictions: No      Mobility Bed Mobility Overal bed mobility: Needs Assistance Bed Mobility: Supine to Sit     Supine to sit: Min guard     General bed mobility comments: minguard for safety  Transfers Overall transfer level: Needs assistance Equipment used: Rolling walker (2 wheeled) Transfers: Sit to/from [employer] CorporationStand;Stand Pivot Transfers Sit to Stand: Mod assist Stand pivot transfers: Min assist       General transfer comment: modA for powerup from surface, minA for stability during stand-pivot transfer    Balance Overall balance  assessment: Needs assistance Sitting-balance support: No upper extremity supported;Feet supported Sitting balance-Leahy Scale: Good     Standing balance support: Bilateral upper extremity supported;During functional activity Standing balance-Leahy Scale: Poor Standing balance comment: reliant on BUE support                           ADL either performed or assessed with clinical judgement   ADL Overall ADL's : Needs assistance/impaired Eating/Feeding: Set up;Sitting   Grooming: Set up;Sitting   Upper Body Bathing: Min guard;Sitting   Lower Body Bathing: Minimal assistance;Sit to/from stand;Moderate assistance   Upper Body Dressing : Min guard;Sitting   Lower Body Dressing: Moderate assistance;Sit to/from stand   Toilet Transfer: Minimal assistance;Stand-pivot;RW StatisticianToilet Transfer Details (indicate cue type and reason): simulated from EOB to recliner Toileting- Clothing Manipulation and Hygiene: Moderate assistance;Sit to/from stand       Functional mobility during ADLs: Moderate assistance;Rolling walker General ADL Comments: educated pt on importance of wrapping residual limb in preparation for prosthetic     Vision         Perception     Praxis      Pertinent Vitals/Pain Pain Assessment: Faces Faces Pain Scale: Hurts whole lot Pain Location: L residual limb Pain Descriptors / Indicators: Aching;Grimacing;Guarding Pain Intervention(s): Limited activity within patient's tolerance;Monitored during session     Hand Dominance Right   Extremity/Trunk Assessment Upper Extremity Assessment Upper Extremity Assessment: Overall WFL for tasks assessed   Lower Extremity Assessment Lower Extremity Assessment: LLE deficits/detail LLE Deficits / Details: s/p BKA; increased pain with knee flexion;educate pt on importance of limb wrapping, positioning, and desensitization to assist with  phantom limb pain/sensation    Cervical / Trunk Assessment Cervical / Trunk  Assessment: Normal   Communication Communication Communication: No difficulties   Cognition Arousal/Alertness: Awake/alert Behavior During Therapy: WFL for tasks assessed/performed Overall Cognitive Status: Within Functional Limits for tasks assessed                                     General Comments  vital signs within normal range throughout session;educated pt on importance of positioning and limb wrapping in preparation for prosthetic;    Exercises     Shoulder Instructions      Home Living Family/patient expects to be discharged to:: Private residence Living Arrangements: Spouse/significant other Available Help at Discharge: Available 24 hours/day Type of Home: House Home Access: Stairs to enter [employer] of Steps: 3 Entrance Stairs-Rails: Can reach both Home Layout: Two level;1/2 bath on main level Alternate Level Stairs-Number of Steps: 15 Alternate Level Stairs-Rails: Can reach both Bathroom Shower/Tub: Tub/shower unit;Walk-in shower   Bathroom Toilet: Standard     Home Equipment: Walker - 2 wheels   Additional Comments: Pt states he will live on 1st floor and sleep in the recliner      Prior Functioning/Environment Level of Independence: Independent        Comments: takes care of grand-children (in their teens)        OT Problem List: Decreased activity tolerance;Impaired balance (sitting and/or standing);Decreased safety awareness;Decreased knowledge of precautions;Decreased knowledge of use of DME or AE;Pain      OT Treatment/Interventions: Therapeutic exercise;Self-care/ADL training;Energy conservation;Therapeutic activities;Patient/family education;Balance training    OT Goals(Current goals can be found in the care plan section) Acute Rehab OT Goals Patient Stated Goal: to get stronger and go home OT Goal Formulation: With patient Time For Goal Achievement: 05/24/19 Potential to Achieve Goals: Good ADL Goals Pt  Will Perform Grooming: with modified independence Pt Will Perform Upper Body Dressing: with modified independence Pt Will Perform Lower Body Dressing: with modified independence;sit to/from stand Pt Will Transfer to Toilet: with modified independence;ambulating;stand pivot transfer  OT Frequency: Min 2X/week   Barriers to D/C: Inaccessible home environment  pt has 4 steps to enter his house       Co-evaluation              AM-PAC OT "6 Clicks" Daily Activity     Outcome Measure Help from another person eating meals?: None Help from another person taking care of personal grooming?: A Little Help from another person toileting, which includes using toliet, bedpan, or urinal?: A Lot Help from another person bathing (including washing, rinsing, drying)?: A Little Help from another person to put on and taking off regular upper body clothing?: A Little Help from another person to put on and taking off regular lower body clothing?: A Lot 6 Click Score: 17   End of Session Equipment Utilized During Treatment: Gait belt;Rolling walker Nurse Communication: Mobility status  Activity Tolerance: Patient tolerated treatment well Patient left: in chair;with call bell/phone within reach  OT Visit Diagnosis: Unsteadiness on feet (R26.81);Other abnormalities of gait and mobility (R26.89);Pain Pain - Right/Left: Left Pain - part of body: Leg                Time: 1225-1301 OT Time Calculation (min): 36 min Charges:  OT General Charges $OT Visit: 1 Visit OT Evaluation $OT Eval Moderate Complexity: 1 Mod OT Treatments $Self Care/Home Management : 8-22  mins  Dorinda Hill OTR/L Acute Rehabilitation Services Office: [place] 05/10/2019, 3:05 PM

## 2019-05-10 NOTE — Progress Notes (Addendum)
  Progress Note    05/10/2019 8:22 AM 1 Day Post-Op  Subjective:  Says his pain is better controlled with PCA  Tm 99.1 now 99 HR 70's-100's ST 767'M-094'B systolic 096% [id number]  Vitals:   05/10/19 0726 05/10/19 0805  BP:  (!) 168/108  Pulse:  89  Resp: 16 14  Temp:  99 F (37.2 C)  SpO2: 98% 100%    Physical Exam: Incisions:  Bandage in place with mild bloody drainage.    CBC    Component Value Date/Time   WBC 11.3 (H) 05/10/2019 0357   RBC 4.95 05/10/2019 0357   HGB 13.6 05/10/2019 0357   HCT 39.7 05/10/2019 0357   PLT 272 05/10/2019 0357   MCV 80.2 05/10/2019 0357   MCH 27.5 05/10/2019 0357   MCHC 34.3 05/10/2019 0357   RDW 14.8 05/10/2019 0357   LYMPHSABS 4.6 (H) 04/27/2019 2140   MONOABS 0.7 04/27/2019 2140   EOSABS 0.3 04/27/2019 2140   BASOSABS 0.1 04/27/2019 2140    BMET    Component Value Date/Time   NA 137 05/10/2019 0357   K 4.6 05/10/2019 0357   CL 105 05/10/2019 0357   CO2 19 (L) 05/10/2019 0357   GLUCOSE 131 (H) 05/10/2019 0357   BUN 11 05/10/2019 0357   CREATININE 0.87 05/10/2019 0357   CALCIUM 9.1 05/10/2019 0357   GFRNONAA >60 05/10/2019 0357   GFRAA >60 05/10/2019 0357    INR    Component Value Date/Time   INR 1.2 05/05/2019 2055     Intake/Output Summary (Last 24 hours) at 05/10/2019 9476 Last data filed at 05/10/2019 0807 Gross per 24 hour  Intake 1476.6 ml  Output 880 ml  Net 596.6 ml     Assessment/Plan:  48 y.o. male is s/p left below knee amputation  1 Day Post-Op  -pt pain well controlled with PCA.  Will continue this today -will take down dressing tomorrow. -will order social worker for financial issues   Sean [name], [facility] [phone] 05/10/2019 8:22 AM   I have independently interviewed and examined patient and agree with PA assessment and plan above.   Sean Jesus C. Donzetta Matters, MD [facility] [place] Office: [phone] Pager: [phone]

## 2019-05-10 NOTE — Evaluation (Signed)
Physical Therapy Evaluation Patient Details Name: Sean [name] MRN: [id number] DOB: [birth year] Today's Date: 05/10/2019   History of Present Illness  48 year old male has undergone multiple left lower extremity revascularizations including bypass followed by lysis followed by attempted mechanical thrombectomy today prior to this.  S/p left BKA  Clinical Impression  Patient presents with decreased independence with mobility due to decreased balance, decreased strength and decreased activity tolerance.  Patient able to walk some in hall but limited due to R LE fatigue and HR up to 126.  Needs mod A for transfers and has two level home.  Feel he will benefit from skilled PT in the acute setting and CIR level rehab prior to d/c home.     Follow Up Recommendations CIR    Equipment Recommendations  Other (comment)(TBA)    Recommendations for Other [name] Rehab consult     Precautions / Restrictions Precautions Precautions: Fall Restrictions Weight Bearing Restrictions: No LLE Weight Bearing: Non weight bearing      Mobility  Bed Mobility Overal bed mobility: Needs Assistance Bed Mobility: Supine to Sit     Supine to sit: Min guard     General bed mobility comments: up in chair  Transfers Overall transfer level: Needs assistance Equipment used: Rolling walker (2 wheeled) Transfers: Sit to/from Stand Sit to Stand: Mod assist Stand pivot transfers: Min assist       General transfer comment: cues for hand placement and assist for balance as transitions to walker; assist to slow descent into chair as trying to put weight on L leg  Ambulation/Gait Ambulation/Gait assistance: Min assist Gait Distance (Feet): 40 Feet Assistive device: Rolling walker (2 wheeled) Gait Pattern/deviations: Step-to pattern;Decreased stride length     General Gait Details: fatigued on R leg with hopping, but maintained balance with walker and min A  Stairs            Wheelchair  Mobility    Modified Rankin (Stroke Patients Only)       Balance Overall balance assessment: Needs assistance Sitting-balance support: No upper extremity supported;Feet supported Sitting balance-Leahy Scale: Good     Standing balance support: Bilateral upper extremity supported;During functional activity Standing balance-Leahy Scale: Poor Standing balance comment: reliant on BUE support due to NWB L LE                             Pertinent Vitals/Pain Pain Assessment: 0-10 Pain Score: 5  Faces Pain Scale: Hurts whole lot Pain Location: L residual limb Pain Descriptors / Indicators: Other (Comment)(phantom pain) Pain Intervention(s): Monitored during session;Repositioned;Other (comment)(patient encouraged to provide normal sensory input)    Home Living Family/patient expects to be discharged to:: Private residence Living Arrangements: Spouse/significant other Available Help at Discharge: Available 24 hours/day Type of Home: House Home Access: Stairs to enter Entrance Stairs-Rails: Can reach both Entrance Stairs-Number of Steps: 3 Home Layout: Two level;1/2 bath on main level Home Equipment: Walker - 2 wheels Additional Comments: Pt states he will live on 1st floor and sleep in the recliner    Prior Function Level of Independence: Independent         Comments: takes care of grand-children (in their teens)     Hand Dominance   Dominant Hand: Right    Extremity/Trunk Assessment   Upper Extremity Assessment Upper Extremity Assessment: Overall WFL for tasks assessed    Lower Extremity Assessment Lower Extremity Assessment: LLE deficits/detail LLE Deficits / Details: L BKA, able to  lift and lower and flex knee some, discussed importance of wrapping or wearing shrinker for preparing for prosthesis, and keeping elevated right now    Cervical / Trunk Assessment Cervical / Trunk Assessment: Normal  Communication   Communication: No difficulties   Cognition Arousal/Alertness: Awake/alert Behavior During Therapy: WFL for tasks assessed/performed Overall Cognitive Status: Within Functional Limits for tasks assessed                                        General Comments General comments (skin integrity, edema, etc.): HR up to 126 with ambulation    Exercises     Assessment/Plan    PT Assessment Patient needs continued PT [name]  PT Problem List Decreased strength;Decreased balance;Decreased activity tolerance;Decreased mobility;Pain;Decreased safety awareness;Decreased knowledge of use of DME;Decreased knowledge of precautions       PT Treatment Interventions Gait training;Functional mobility training;Therapeutic exercise;Patient/family education;DME instruction;Balance training;Stair training;Therapeutic activities    PT Goals (Current goals can be found in the Care Plan section)  Acute Rehab PT Goals Patient Stated Goal: to get stronger and go home PT Goal Formulation: With patient Time For Goal Achievement: 05/24/19 Potential to Achieve Goals: Good    Frequency Min 4X/week   Barriers to discharge        Co-evaluation               AM-PAC PT "6 Clicks" Mobility  Outcome Measure Help needed turning from your back to your side while in a flat bed without using bedrails?: A Little Help needed moving from lying on your back to sitting on the side of a flat bed without using bedrails?: A Little Help needed moving to and from a bed to a chair (including a wheelchair)?: A Lot Help needed standing up from a chair using your arms (e.g., wheelchair or bedside chair)?: A Lot Help needed to walk in hospital room?: A Little Help needed climbing 3-5 steps with a railing? : A Lot 6 Click Score: 15    End of Session Equipment Utilized During Treatment: Gait belt Activity Tolerance: Patient limited by fatigue Patient left: in chair;with call bell/phone within reach   PT Visit Diagnosis: Other  abnormalities of gait and mobility (R26.89);Pain Pain - Right/Left: Left Pain - part of body: Leg    Time: [id number] PT Time Calculation (min) (ACUTE ONLY): 23 min   Charges:   PT Evaluation $PT Eval Moderate Complexity: 1 Mod PT Treatments $Gait Training: 8-22 mins        Sean [name], Sean [name] [phone] 05/10/2019   Elray Mcgregorynthia [name] 05/10/2019, 4:46 PM

## 2019-05-11 ENCOUNTER — Encounter (HOSPITAL_COMMUNITY): Payer: Self-pay | Admitting: Vascular Surgery

## 2019-05-11 LAB — BASIC METABOLIC PANEL
Anion gap: 10 (ref 5–15)
BUN: 7 mg/dL (ref 6–20)
CO2: 27 mmol/L (ref 22–32)
Calcium: 9 mg/dL (ref 8.9–10.3)
Chloride: 101 mmol/L (ref 98–111)
Creatinine, Ser: 0.82 mg/dL (ref 0.61–1.24)
GFR calc Af Amer: >60 mL/min (ref >60)
GFR calc non Af Amer: >60 mL/min (ref >60)
Glucose, Bld: 112 mg/dL — ABNORMAL HIGH (ref 70–99)
Potassium: 3.9 mmol/L (ref 3.5–5.1)
Sodium: 138 mmol/L (ref 135–145)

## 2019-05-11 LAB — CBC
HCT: 39.2 % (ref 39.0–52.0)
Hemoglobin: 13.3 g/dL (ref 13.0–17.0)
MCH: 27.5 pg (ref 26.0–34.0)
MCHC: 33.9 g/dL (ref 30.0–36.0)
MCV: 81 fL (ref 80.0–100.0)
Platelets: 307 10*3/uL (ref 150–400)
RBC: 4.84 MIL/uL (ref 4.22–5.81)
RDW: 15.1 % (ref 11.5–15.5)
WBC: 9.7 10*3/uL (ref 4.0–10.5)
nRBC: 0 % (ref 0.0–0.2)

## 2019-05-11 LAB — SURGICAL PATHOLOGY

## 2019-05-11 MED ORDER — LISINOPRIL 20 MG PO TABS: 40.0000 mg | ORAL_TABLET | Freq: Every day | ORAL | Status: DC

## 2019-05-11 MED ORDER — HYDROCHLOROTHIAZIDE 25 MG PO TABS: 25.0000 mg | ORAL_TABLET | Freq: Every day | ORAL | Status: DC

## 2019-05-11 MED ORDER — HYDROCHLOROTHIAZIDE 25 MG PO TABS
25.0000 mg | ORAL_TABLET | Freq: Every day | ORAL | Status: DC
  Filled 2019-05-11: qty 1

## 2019-05-11 MED ORDER — HYDROCHLOROTHIAZIDE 25 MG PO TABS
25.0000 mg | ORAL_TABLET | Freq: Every day | ORAL | Status: DC
  Administered 2019-05-11 – 2019-05-16 (×6): 25 mg via ORAL
  Filled 2019-05-11 (×5): qty 1

## 2019-05-11 MED ORDER — LISINOPRIL 20 MG PO TABS
40.0000 mg | ORAL_TABLET | Freq: Every day | ORAL | Status: DC
  Filled 2019-05-11: qty 2

## 2019-05-11 MED ORDER — LISINOPRIL 40 MG PO TABS
40.0000 mg | ORAL_TABLET | Freq: Every day | ORAL | Status: DC
  Administered 2019-05-11 – 2019-05-16 (×6): 40 mg via ORAL
  Filled 2019-05-11 (×6): qty 1

## 2019-05-11 NOTE — Anesthesia Postprocedure Evaluation (Signed)
Anesthesia Post Note  Patient: Sean [name]  Procedure(s) Performed: Left Below Knee Amputation (Left Leg Lower)     Patient location during evaluation: PACU Anesthesia Type: Regional and MAC Level of consciousness: awake and alert Pain management: pain level controlled Vital Signs Assessment: post-procedure vital signs reviewed and stable Respiratory status: spontaneous breathing, nonlabored ventilation, respiratory function stable and patient connected to nasal cannula oxygen Cardiovascular status: blood pressure returned to baseline and stable Postop Assessment: no apparent nausea or vomiting Anesthetic complications: no    Last Vitals:  Vitals:   05/11/19 1252 05/11/19 1730  BP: (!) 124/92   Pulse:    Resp:  (!) 22  Temp:    SpO2:  92%    Last Pain:  Vitals:   05/11/19 1730  TempSrc:   PainSc: 4                  Kenta Laster

## 2019-05-11 NOTE — Progress Notes (Signed)
Orthopedic Tech Progress Note Patient Details:  Sean [name] 1970/11/26 [id number] Called in order to [facility] for a RETENTION SOCK [name], male   DOB: 1970-12-02, 48 y.o.   MRN: [id number]   Janit Pagan 05/11/2019, 8:50 AM

## 2019-05-11 NOTE — Progress Notes (Addendum)
[facility] [place]  Subjective  - Pain has improved from before surgery.   Objective (!) 154/108 98 98.6 F (37 C) (Oral) 18 97%  Intake/Output Summary (Last 24 hours) at 05/11/2019 0830 Last data filed at 05/11/2019 4709 Gross per 24 hour  Intake 600 ml  Output 1800 ml  Net -1200 ml    Left BKA incision healing well with intact staples. Dressing applied patient tolerated this well Lungs non labored breathing   Assessment/Planning: POD # 2 left BKA  Pending CIR and insurance verses home Will contact [employer] for retention sock  [name] 05/11/2019 8:30 AM --  Laboratory Lab Results: Recent Labs    05/10/19 0357 05/11/19 0647  WBC 11.3* 9.7  HGB 13.6 13.3  HCT 39.7 39.2  PLT 272 307   BMET Recent Labs    05/10/19 0357 05/11/19 0647  NA 137 138  K 4.6 3.9  CL 105 101  CO2 19* 27  GLUCOSE 131* 112*  BUN 11 7  CREATININE 0.87 0.82  CALCIUM 9.1 9.0    COAG Lab Results  Component Value Date   INR 1.2 05/05/2019   No results found for: PTT  I have independently interviewed and examined patient and agree with PA assessment and plan above.   Brandon C. Donzetta Matters, MD [facility] [place] Office: [phone] Pager: [phone]

## 2019-05-11 NOTE — Progress Notes (Signed)
Inpatient Rehab Admissions:  Inpatient Rehab Consult received.  I met with pt at the bedside for rehabilitation assessment. Feel pt is a great rehab candidate and would do great in an IP Rehab setting. I provided pt with details of an IP Rehab program and he is interested. I did note that the pt's insurance plan is out of network with [facility]. I discussed his insurance benefits for an out of network stay. At this time, the pt would prefer an in network program if available due to cost. [facility] will contact CM to let her know pt's preference for in network rehabs. Will follow in case that plan falls through and pt still interested in CIR here at [facility].   Jhonnie Garner, OTR/L  Rehab Admissions Coordinator  [phone] 05/11/2019 11:37 AM

## 2019-05-11 NOTE — TOC Initial Note (Addendum)
Transition of Care [facility]) - Initial/Assessment Note    Patient Details  Name: Sean [name] MRN: [id number] Date of Birth: Jan 17, 1971  Transition of Care [facility]) CM/SW Contact:    Bethena Roys, RN Phone Number: 05/11/2019, 3:02 PM  Clinical Narrative:  Patient presented for  Left BKA- PTA from home with support of spouse. PT recommendations for Inpatient [facility]. Our Inpatient Rehab is out of network with patient's insurance. CM has sent referral to the [facility] to see if a candidate. Referral sent to [facility] @ [phone] (fax) [phone]. Nevin Bloodgood has asked when will the PCA be discontinued- Staff RN to call MD. CM did send page to PA- awaiting call back. CM will continue to monitor for additional transition of care needs.                 1547 Per Staff RN the PCA pump will be discontinued on Friday. Paula Case Manager at the [facility] has to have MD- review the case. CM will follow.  Expected Discharge Plan: IP Rehab Facility Barriers to Discharge: No Barriers Identified   Patient Goals and CMS Choice Patient states their goals for this hospitalization and ongoing recovery are:: "to go to rehab"   Choice offered to / list presented to : NA  Expected Discharge Plan and Services Expected Discharge Plan: [place] In-house Referral: NA Discharge Planning Services: CM Consult Post Acute Care Choice: IP Rehab Living arrangements for the past 2 months: Single Family Home                   Prior Living Arrangements/Services Living arrangements for the past 2 months: Single Family Home Lives with:: Spouse Patient language and need for interpreter reviewed:: Yes Do you feel safe going back to the place where you live?: Yes      Need for Family Participation in Patient Care: Yes (Comment) Care giver support system in place?: Yes (comment)   Criminal Activity/Legal Involvement Pertinent to Current Situation/Hospitalization: No - Comment as  needed  Activities of Daily Living Home Assistive Devices/Equipment: None ADL Screening (condition at time of admission) Patient's cognitive ability adequate to safely complete daily activities?: Yes Is the patient deaf or have difficulty hearing?: No Does the patient have difficulty seeing, even when wearing glasses/contacts?: No Does the patient have difficulty concentrating, remembering, or making decisions?: No Patient able to express need for assistance with ADLs?: Yes Does the patient have difficulty dressing or bathing?: No Independently performs ADLs?: Yes (appropriate for developmental age) Does the patient have difficulty walking or climbing stairs?: No Weakness of Legs: Left Weakness of Arms/Hands: None  Permission Sought/Granted Permission sought to share information with : Family Supports, Investment banker, corporate granted to share info w AGENCY: [employer]        Emotional Assessment Appearance:: Appears stated age Attitude/Demeanor/Rapport: Engaged Affect (typically observed): Accepting Orientation: : Oriented to Situation, Oriented to  Time, Oriented to Place, Oriented to Self Alcohol / Substance Use: Not Applicable Psych Involvement: No (comment)  Admission diagnosis:  gangres toe lt foot Patient Active Problem List   Diagnosis Date Noted  . Ischemia of left lower extremity 04/28/2019  . PAD (peripheral artery disease) ([place]) 04/28/2019  . Ischemia of extremity 09/21/2018  . PVD (peripheral vascular disease) ([place]) 09/20/2018   PCP:  Patient, No Pcp Per Pharmacy:   [id number] - [place], [place]. [address] [place] [address] Phone: [phone]  Fax: [phone]  [facility] [place] [address] [place] [address] Phone: [phone] Fax: [phone]     Social Determinants of Health (SDOH) Interventions     Readmission Risk Interventions No flowsheet data found.

## 2019-05-11 NOTE — Progress Notes (Signed)
Physical Therapy Treatment Patient Details Name: Sean PalmaClarence [name] MRN: [id number] DOB: 08/25/1970 Today's Date: 05/11/2019    History of Present Illness 48 year old male has undergone multiple left lower extremity revascularizations including bypass followed by lysis followed by attempted mechanical thrombectomy today prior to this.  S/p left BKA    PT Comments    Pt eager to get out of bed and work with therapy. Pt initially reports 3/10, however with L LE in dependent position pain increased to 8/10. Pt is min guard for bed mobility, mod A for transfer from low surface and min A for hopping with RW. Pt with 2 attempts to ambulate however limited by pain. Pt obviously disappointed by performance. Encouraged pt that with pain control ambulation will be easier. Will ensure pain medication given prior to subsequent treatment session. CIR d/c plans remain appropriate. PT will continue to follow acutely.    Follow Up Recommendations  CIR     Equipment Recommendations  Other (comment)(TBA)    Recommendations for Other Services Rehab consult     Precautions / Restrictions Precautions Precautions: Fall Restrictions Weight Bearing Restrictions: Yes LLE Weight Bearing: Non weight bearing    Mobility  Bed Mobility Overal bed mobility: Needs Assistance Bed Mobility: Supine to Sit     Supine to sit: Min guard     General bed mobility comments: min guard for safety, increased effort and time required  Transfers Overall transfer level: Needs assistance Equipment used: Rolling walker (2 wheeled) Transfers: Sit to/from Stand Sit to Stand: Mod assist;Min assist         General transfer comment: minA from elevated bed, mod A from recliner  Ambulation/Gait Ambulation/Gait assistance: Min assist Gait Distance (Feet): 8 Feet(2x8) Assistive device: Rolling walker (2 wheeled) Gait Pattern/deviations: Step-to pattern;Decreased stride length Gait velocity: decreased Gait velocity  interpretation: <1.31 ft/sec, indicative of household ambulator General Gait Details: pt with increased pain with R LE in dependent position, able to hop forward 4 and back 4 feet to recliner, after seated rest break attempted again however was limited by pain and returned to recliner         Balance Overall balance assessment: Needs assistance Sitting-balance support: No upper extremity supported;Feet supported Sitting balance-Leahy Scale: Good     Standing balance support: Bilateral upper extremity supported;During functional activity Standing balance-Leahy Scale: Poor Standing balance comment: requires B UE support on RW                             Cognition Arousal/Alertness: Awake/alert Behavior During Therapy: WFL for tasks assessed/performed Overall Cognitive Status: Within Functional Limits for tasks assessed                                           General Comments General comments (skin integrity, edema, etc.): VSS, shrinker in place on entry, pt with reports of phantom limb pain, instructed in sensory feedback       Pertinent Vitals/Pain Pain Assessment: 0-10 Pain Score: 8  Pain Location: L residual limb Pain Descriptors / Indicators: Other (Comment)(phantom pain) Pain Intervention(s): Limited activity within patient's tolerance;Monitored during session;Repositioned           PT Goals (current goals can now be found in the care plan section) Acute Rehab PT Goals PT Goal Formulation: With patient Time For Goal Achievement: 05/24/19 Potential to Achieve Goals: Good  Progress towards PT goals: Not progressing toward goals - comment(limited in session by pain)    Frequency    Min 4X/week      PT Plan Current plan remains appropriate       AM-PAC PT "6 Clicks" Mobility   Outcome Measure  Help needed turning from your back to your side while in a flat bed without using bedrails?: A Little Help needed moving from lying on  your back to sitting on the side of a flat bed without using bedrails?: A Little Help needed moving to and from a bed to a chair (including a wheelchair)?: A Lot Help needed standing up from a chair using your arms (e.g., wheelchair or bedside chair)?: A Lot Help needed to walk in hospital room?: A Little Help needed climbing 3-5 steps with a railing? : A Lot 6 Click Score: 15    End of Session Equipment Utilized During Treatment: Gait belt Activity Tolerance: Patient limited by pain Patient left: in chair;with call bell/phone within reach Nurse Communication: Mobility status PT Visit Diagnosis: Other abnormalities of gait and mobility (R26.89);Pain Pain - Right/Left: Left Pain - part of body: Leg     Time: 2482-5003 PT Time Calculation (min) (ACUTE ONLY): 25 min  Charges:  $Gait Training: 8-22 mins $Therapeutic Activity: 8-22 mins                     Sean [name] B. Migdalia Dk PT, DPT Acute Rehabilitation Services Pager [phone] Office [phone]    [place] 05/11/2019, 5:41 PM

## 2019-05-12 ENCOUNTER — Ambulatory Visit: Payer: BLUE CROSS/BLUE SHIELD | Admitting: Vascular Surgery

## 2019-05-12 ENCOUNTER — Telehealth: Payer: Self-pay

## 2019-05-12 MED ORDER — HYDROMORPHONE HCL 1 MG/ML IJ SOLN
0.5000 mg | INTRAMUSCULAR | Status: DC | PRN
  Administered 2019-05-12 – 2019-05-15 (×10): 0.5 mg via INTRAVENOUS
  Filled 2019-05-12 (×10): qty 1

## 2019-05-12 NOTE — TOC Progression Note (Addendum)
Transition of Care [facility]) - Progression Note    Patient Details  Name: Sean [name] MRN: [id number] Date of Birth: 1970-09-09  Transition of Care [facility]) CM/SW Contact  Graves-Bigelow, Ocie Cornfield, RN Phone Number: 05/12/2019, 1:37 PM  Clinical Narrative:   CM discussed that information was sent to [facility] and [place] for CIR-family wants [employer]. CM did call CIR Liaison Tally Due at [phone] awaiting call back. Dr Rosana Hoes at the [facility] center felt that patient was a good candidate- however since patient is asking for [facility]- CM will await call back to see if patient is eligible. Nevin Bloodgood wants to submit clinicals to see if insurance will approve-on hold at this point to see if [facility] will assist.   [id number] 05-12-19 CM was able to speak with Deputy- she will submit clinicals to insurance for authorization. Now we will wait for authorization. Patient will need a COVID test Sunday for possible transition out Monday if insurance approves. Pt will need PTAR for transportation.CM will continue to follow for transition of care needs.    Expected Discharge Plan: IP Rehab Facility Barriers to Discharge: No Barriers Identified  Expected Discharge Plan and Services Expected Discharge Plan: [place] In-house Referral: NA Discharge Planning Services: CM Consult Post Acute Care Choice: IP Rehab Living arrangements for the past 2 months: Single Family Home                  Social Determinants of Health (SDOH) Interventions    Readmission Risk Interventions No flowsheet data found.

## 2019-05-12 NOTE — Telephone Encounter (Signed)
[place] [facility] pharmacy called in reference to patient getting a Humira injection this weekend. Injection approved per Dr. Donzetta Matters.

## 2019-05-12 NOTE — Progress Notes (Signed)
  Progress Note    05/12/2019 8:21 AM 3 Days Post-Op  Subjective: Pain feeling much better  Vitals:   05/12/19 0806 05/12/19 0817  BP:    Pulse:    Resp: (!) 8 15  Temp:    SpO2: 99% 99%    Physical Exam: Awake alert oriented No lab respirations Left BKA stump sock in place  CBC    Component Value Date/Time   WBC 9.7 05/11/2019 0647   RBC 4.84 05/11/2019 0647   HGB 13.3 05/11/2019 0647   HCT 39.2 05/11/2019 0647   PLT 307 05/11/2019 0647   MCV 81.0 05/11/2019 0647   MCH 27.5 05/11/2019 0647   MCHC 33.9 05/11/2019 0647   RDW 15.1 05/11/2019 0647   LYMPHSABS 4.6 (H) 04/27/2019 2140   MONOABS 0.7 04/27/2019 2140   EOSABS 0.3 04/27/2019 2140   BASOSABS 0.1 04/27/2019 2140    BMET    Component Value Date/Time   NA 138 05/11/2019 0647   K 3.9 05/11/2019 0647   CL 101 05/11/2019 0647   CO2 27 05/11/2019 0647   GLUCOSE 112 (H) 05/11/2019 0647   BUN 7 05/11/2019 0647   CREATININE 0.Sean 05/11/2019 0647   CALCIUM 9.0 05/11/2019 0647   GFRNONAA >60 05/11/2019 0647   GFRAA >60 05/11/2019 0647    INR    Component Value Date/Time   INR 1.2 05/05/2019 2055     Intake/Output Summary (Last 24 hours) at 05/12/2019 0821 Last data filed at 05/12/2019 1655 Gross per 24 hour  Intake 480 ml  Output 3575 ml  Net -3095 ml     Assessment/plan:  48 y.o. Sean [name] is s/p attempted endovascular thrombectomy left lower extremity now status post below-knee amputation.  Case management to work on placement.  Will DC PCA today.     Brandon C. Donzetta Matters, MD [facility] [place] Office: [phone] Pager: [phone]  05/12/2019 8:21 AM

## 2019-05-12 NOTE — TOC Progression Note (Addendum)
Transition of Care [facility]) - Progression Note    Patient Details  Name: Sean [name] MRN: [id number] Date of Birth: 1971/03/12  Transition of Care [facility]) CM/SW Contact  Graves-Bigelow, Ocie Cornfield, RN Phone Number: 05/12/2019, 12:06 PM   Clinical Narrative:  CM still awaiting to here back from the [place]. CM also faxed information to [facility] to see if MD's can review to see if patient is a candidate and if insurance will approve. CM did leave a message with Reginia Naas with [employer] to see which other inpatient rehabs would be covered by [facility]. CM will continue to monitor.   05-12-19 1213 Spoke with wife and she wants me to check with [facility]. Trying to see if [facility] is in network. Reached out to Case Manager at [facility] will continue to monitor.   Expected Discharge Plan: IP Rehab Facility Barriers to Discharge: No Barriers Identified  Expected Discharge Plan and Services Expected Discharge Plan: [place] In-house Referral: NA Discharge Planning Services: CM Consult Post Acute Care Choice: IP Rehab Living arrangements for the past 2 months: Single Family Home                   Social Determinants of Health (SDOH) Interventions    Readmission Risk Interventions No flowsheet data found.

## 2019-05-13 NOTE — Progress Notes (Addendum)
[facility] [place]  Subjective  - pain is much better.   Objective 128/90 82 98.8 F (37.1 C) (Oral) 17 95%  Intake/Output Summary (Last 24 hours) at 05/13/2019 0846 Last data filed at 05/13/2019 1157 Gross per 24 hour  Intake 720 ml  Output 1450 ml  Net -730 ml    Left BKA incision healing well Lungs non labored breathing Gen NAD  Assessment/Planning: POD # 4 Left BKA  Viable stump and has retention sock from [place] and insurance.  Pending -[facility]- she will submit clinicals to insurance for authorization. Now we will wait for authorization. Patient will need a COVID test Sunday for possible transition out Monday if insurance approves. Pt will need PTAR for transportation.CM will continue to follow for transition of care needs.   Sean [name] 05/13/2019 8:46 AM -- Agree with above Awaiting placement BKA healing Continue PT/OT  Ruta Hinds, MD [facility] [place]: [phone] Pager: [phone]  Laboratory Lab Results: Recent Labs    05/11/19 0647  WBC 9.7  HGB 13.3  HCT 39.2  PLT 307   BMET Recent Labs    05/11/19 0647  NA 138  K 3.9  CL 101  CO2 27  GLUCOSE 112*  BUN 7  CREATININE 0.82  CALCIUM 9.0    COAG Lab Results  Component Value Date   INR 1.2 05/05/2019   No results found for: PTT

## 2019-05-14 NOTE — Progress Notes (Addendum)
[facility]   Subjective  - Doing well with good pain control.   Objective 128/90 80 97.9 F (36.6 C) (Oral) 14 100%  Intake/Output Summary (Last 24 hours) at 05/14/2019 0919 Last data filed at 05/14/2019 0650 Gross per 24 hour  Intake 363 ml  Output 2075 ml  Net -1712 ml    Left BKA incision healing well viable stump.  Warm to touch Lungs non labored breathing  Assessment/Planning: POD # 5  Viable stump and has retention sock from [place] and insurance.  I order COVID test today for preparation to discharge to rehab.  Pending -[facility]- she will submit clinicals to insurance for authorization. Now we will wait for authorization.Patient will need a COVID test Sunday for possible transition out [employer] approves. Pt will need PTAR for transportation.CM will continue to follow for transition of care needs.  [name] 05/14/2019 9:19 AM --  Agree with above Waiting on placement COVID test ordered  Ruta Hinds, MD [facility] [place]: [phone] Pager: [phone]   Laboratory Lab Results: No results for input(s): WBC, HGB, HCT, PLT in the last 72 hours. BMET No results for input(s): NA, K, CL, CO2, GLUCOSE, BUN, CREATININE, CALCIUM in the last 72 hours.  COAG Lab Results  Component Value Date   INR 1.2 05/05/2019   No results found for: PTT

## 2019-05-15 LAB — NOVEL CORONAVIRUS, NAA (HOSP ORDER, SEND-OUT TO REF LAB; TAT 18-24 HRS): SARS-CoV-2, NAA: NOT DETECTED

## 2019-05-15 MED ORDER — GABAPENTIN 300 MG PO CAPS
300.0000 mg | ORAL_CAPSULE | Freq: Three times a day (TID) | ORAL | Status: DC
  Administered 2019-05-15 – 2019-05-16 (×4): 300 mg via ORAL
  Filled 2019-05-15 (×4): qty 1

## 2019-05-15 NOTE — Progress Notes (Signed)
Physical Therapy Treatment Patient Details Name: Sean PalmaClarence Asano MRN: [id number] DOB: 07/09/1971 Today's Date: 05/15/2019    History of Present Illness 48 year old male has undergone multiple left lower extremity revascularizations including bypass followed by lysis followed by attempted mechanical thrombectomy today prior to this.  S/p left BKA    PT Comments    Pt in chair agreeable to therapy this morning, however complaints of phantom limb pain earlier this morning, and currently heartburn and stomach cramping. Pt min guard for sit>stand from recliner. On the way to the door pt reports need to use bathroom. Pt sat in straight chair while bathroom prepared. Pt min guard for ambulation into bathroom. Pt with constipation and increased time needed for BM. Pt able to sit>stand 3x with min guard for pericare. Pt with reports of increased pain in residual limb from being in dependent position. Pt requires min A for ambulation and requests sitting in straight chair due to extreme cramping in R LE. With rest, cramp in R LE lessened and pt requires minA for return to bed. Once in bed pt becomes tearful due to 10/10 pain in residual limb, and cramping in R LE. RN notified and pain medication administered. Pt limited in today's session by constipation and increased pain due to residual limb in dependent position. Given pt's desire to work with therapy and regain independence, as well as 24 hour assist at discharge, PT continues to recommend CIR level rehab prior to return home. PT will continue to follow acutely.    Follow Up Recommendations  CIR     Equipment Recommendations  Other (comment)(TBA)    Recommendations for Other Services Rehab consult     Precautions / Restrictions Precautions Precautions: Fall Restrictions Weight Bearing Restrictions: Yes LLE Weight Bearing: Non weight bearing    Mobility  Bed Mobility               General bed mobility comments: sitting in recliner on  entry   Transfers Overall transfer level: Needs assistance Equipment used: Rolling walker (2 wheeled) Transfers: Sit to/from Stand Sit to Stand: Min guard         General transfer comment: min guard for initial stand from recliner, increased effort, min guard for 3x sit>stand from toilet with use of B armrests, 2x sit>stand from straight chair in room   Ambulation/Gait Ambulation/Gait assistance: Min assist;Min guard Gait Distance (Feet): 25 Feet Assistive device: Rolling walker (2 wheeled) Gait Pattern/deviations: Step-to pattern;Decreased stride length Gait velocity: decreased Gait velocity interpretation: <1.31 ft/sec, indicative of household ambulator General Gait Details: min guard initially from recliner, to door, once at door pt reports needing to use bathroom, sat in straight chair while BSC placed over toilet, min guard for ambulation to bathroom, minA for ambulation from toilet to straight chair and from straight chair to bed         Balance Overall balance assessment: Needs assistance Sitting-balance support: No upper extremity supported;Feet supported Sitting balance-Leahy Scale: Good     Standing balance support: Bilateral upper extremity supported;During functional activity Standing balance-Leahy Scale: Poor Standing balance comment: requires B UE support on RW                             Cognition Arousal/Alertness: Awake/alert Behavior During Therapy: WFL for tasks assessed/performed Overall Cognitive Status: Within Functional Limits for tasks assessed  General Comments General comments (skin integrity, edema, etc.): max HR with activity 110bpm      Pertinent Vitals/Pain Pain Assessment: 0-10 Pain Score: 10-Worst pain ever Pain Location: abdomen, L residual limb, R leg Pain Descriptors / Indicators: Other (Comment);Aching;Cramping(phantom pain) Pain Intervention(s): Limited  activity within patient's tolerance;Monitored during session;Patient requesting pain meds-RN notified;RN gave pain meds during session           PT Goals (current goals can now be found in the care plan section) Acute Rehab PT Goals PT Goal Formulation: With patient Time For Goal Achievement: 05/24/19 Potential to Achieve Goals: Good    Frequency    Min 4X/week      PT Plan Current plan remains appropriate       AM-PAC PT "6 Clicks" Mobility   Outcome Measure  Help needed turning from your back to your side while in a flat bed without using bedrails?: A Little Help needed moving from lying on your back to sitting on the side of a flat bed without using bedrails?: A Little Help needed moving to and from a bed to a chair (including a wheelchair)?: A Lot Help needed standing up from a chair using your arms (e.g., wheelchair or bedside chair)?: A Lot Help needed to walk in hospital room?: A Little Help needed climbing 3-5 steps with a railing? : A Lot 6 Click Score: 15    End of Session Equipment Utilized During Treatment: Gait belt Activity Tolerance: Patient limited by pain Patient left: in bed;with call bell/phone within reach Nurse Communication: Mobility status PT Visit Diagnosis: Other abnormalities of gait and mobility (R26.89);Pain Pain - Right/Left: Left Pain - part of body: Leg     Time: 1112-1201 PT Time Calculation (min) (ACUTE ONLY): 49 min  Charges:  $Gait Training: 8-22 mins $Therapeutic Activity: 23-37 mins                     Sowmya Partridge B. Migdalia Dk PT, DPT Acute Rehabilitation Services Pager [phone] Office [phone]    [place] 05/15/2019, 2:53 PM

## 2019-05-15 NOTE — Progress Notes (Signed)
[facility] [place]  Subjective  - Woke up with phantom pain at 3 am requiring both PO and IV pain medication.     Objective 126/82 89 98.2 F (36.8 C) (Oral) 15 94%  Intake/Output Summary (Last 24 hours) at 05/15/2019 0806 Last data filed at 05/15/2019 0211 Gross per 24 hour  Intake 366 ml  Output 595 ml  Net -229 ml    Left BKA stump healing well, has retention sock from [employer]. Stump warm to touch. Lungs non labored breathing.  Assessment/Planning: POD # 6 Left BKA  Pending COVID test and Rehab placement.  COVID test performed 05/14/2019. I will increase his Neurontin dose from 100 mg TID to 300 TID for the Phantom pain.    [name] 05/15/2019 8:06 AM --  Laboratory Lab Results: No results for input(s): WBC, HGB, HCT, PLT in the last 72 hours. BMET No results for input(s): NA, K, CL, CO2, GLUCOSE, BUN, CREATININE, CALCIUM in the last 72 hours.  COAG Lab Results  Component Value Date   INR 1.2 05/05/2019   No results found for: PTT

## 2019-05-15 NOTE — TOC Progression Note (Addendum)
Transition of Care [facility]) - Progression Note    Patient Details  Name: Sean [name] MRN: [id number] Date of Birth: 10-25-1970  Transition of Care [facility]) CM/SW Contact  Graves-Bigelow, Ocie Cornfield, RN Phone Number: 05/15/2019, 3:05 PM  Clinical Narrative: CM faxed additional PT notes and other clinicals to Tally Due for insurance. Awaiting Insurance approval for CIR. CM will continue to monitor.     Expected Discharge Plan: IP Rehab Facility Barriers to Discharge: No Barriers Identified  Expected Discharge Plan and Services Expected Discharge Plan: [place] In-house Referral: NA Discharge Planning Services: CM Consult Post Acute Care Choice: IP Rehab Living arrangements for the past 2 months: Single Family Home                  Readmission Risk Interventions No flowsheet data found.

## 2019-05-15 NOTE — Progress Notes (Signed)
Pt working with PT - ambulated to bathroom to have BM - developing excruciating pain in post-surgical limb. Pt crying in pain once getting back to bed; PRN meds administered as ordered. Pt did receive increased dose of gabapentin this am per orders as well. Will continue to monitor patient's pain closely.

## 2019-05-16 DIAGNOSIS — Z7409 Other reduced mobility: Secondary | ICD-10-CM

## 2019-05-16 DIAGNOSIS — Z89512 Acquired absence of left leg below knee: Secondary | ICD-10-CM

## 2019-05-16 DIAGNOSIS — Z789 Other specified health status: Secondary | ICD-10-CM

## 2019-05-16 HISTORY — DX: Other specified health status: Z78.9

## 2019-05-16 HISTORY — DX: Other reduced mobility: Z74.09

## 2019-05-16 HISTORY — DX: Acquired absence of left leg below knee: Z89.512

## 2019-05-16 LAB — GLUCOSE, CAPILLARY: Glucose-Capillary: 99 mg/dL (ref 70–99)

## 2019-05-16 MED ORDER — GABAPENTIN 300 MG PO CAPS: 300.0000 mg | ORAL_CAPSULE | Freq: Three times a day (TID) | ORAL | 0 refills | Status: DC

## 2019-05-16 MED ORDER — DOCUSATE SODIUM 100 MG PO CAPS: 100.0000 mg | ORAL_CAPSULE | Freq: Two times a day (BID) | ORAL | 0 refills | Status: DC

## 2019-05-16 MED ORDER — OXYCODONE-ACETAMINOPHEN 5-325 MG PO TABS: 1.0000 | ORAL_TABLET | ORAL | 0 refills | Status: DC | PRN

## 2019-05-16 MED ORDER — RIVAROXABAN 20 MG PO TABS: 20.0000 mg | ORAL_TABLET | Freq: Every day | ORAL | Status: DC

## 2019-05-16 NOTE — TOC Transition Note (Signed)
Transition of Care [facility]) - CM/SW Discharge Note   Patient Details  Name: Sean [name] MRN: [id number] Date of Birth: [birth year]  Transition of Care [facility]) CM/SW Contact:  Bethena Roys, RN Phone Number: 05/16/2019, 9:40 AM   Clinical Narrative:  CM received notification of insurance approval for patient to be discharged to inpatient rehab at [facility] D/c Summary and [facility] to be completed and faxed prior to transport to [phone]. Pt will go to room C-410 Rehab Unit. PTAR to transport to [place] [address] Staff RN to call report to [phone]. Tally Due will need to be notified for estimated time of arrival via [place] at [phone]. CM will continue to monitor for additional transition of care needs.    Final next level of care: IP Rehab Facility Barriers to Discharge: No Barriers Identified   Patient Goals and CMS Choice Patient states their goals for this hospitalization and ongoing recovery are:: "to go to rehab"   Choice offered to / list presented to : NA  Discharge Placement                       Discharge Plan and Services In-house Referral: NA Discharge Planning Services: CM Consult Post Acute Care Choice: IP Rehab                               Social Determinants of Health (SDOH) Interventions     Readmission Risk Interventions No flowsheet data found.

## 2019-05-16 NOTE — Discharge Summary (Signed)
[facility] Discharge Summary   Patient ID:  Sean [name] MRN: [id number] DOB/AGE: [birth year] 48 y.o.  Admit date: 05/05/2019 Discharge date: 05/16/2019 Date of Surgery: 05/09/2019 Surgeon: Surgeon(s): Maeola Harman, MD  Admission Diagnosis: gangres toe lt foot  Discharge Diagnoses:  gangres toe lt foot  Secondary Diagnoses: Past Medical History:  Diagnosis Date  . Hypertension   . PVD (peripheral vascular disease) (HCC)     Procedure(s): Left Below Knee Amputation  Discharged Condition: good  HPI:  This is a 48 y.o.malewho is s/p Left lower extremity percutaneous thrombolysis of vein graft by Dr. Randie Heinz 04/27/19. Recheck lysis and balloon angioplasty of bypass outflow including posterior tibial artery by Dr. Chestine Spore 04/28/19. 09/21/18 Left above-knee popliteal to posterior tibial artery bypass with non-reversed ipsilateral greater saphenous veinwith 4 compartmentfasciotomiesby Dr. Randie Heinz.  He comes to our office with reports of sever pain in his toes that keeps him from sleeping and ADL's. He states that the oxycodone does not relieve his pain at all.  He reports left foot all toes are throbbing with pain and no position change helps. He denise fever and chills. He continues to take Plavix and xarelto daily.   He was admitted to the hospital, Xarelto was held, heparin was started and Left BKA was planned.    Hospital Course:  Naseer Hearn is a 48 y.o. male is S/P  Procedure(s): Left Below Knee Amputation His pain has been well controlled and Gabapentin was increased to 300 mg TID to assist with phantom pain episodes.  Heparin restarted 05/16/2019.   Treatment Team:  Sherren Kerns, MDStump is healing well and appears viable.  [employer] provided stump retention sock.    Plan to f/u in our office for staple removal in 3 weeks.     Significant Diagnostic Studies: CBC Lab Results  Component Value Date   WBC 9.7  05/11/2019   HGB 13.3 05/11/2019   HCT 39.2 05/11/2019   MCV 81.0 05/11/2019   PLT 307 05/11/2019    BMET    Component Value Date/Time   NA 138 05/11/2019 0647   K 3.9 05/11/2019 0647   CL 101 05/11/2019 0647   CO2 27 05/11/2019 0647   GLUCOSE 112 (H) 05/11/2019 0647   BUN 7 05/11/2019 0647   CREATININE 0.82 05/11/2019 0647   CALCIUM 9.0 05/11/2019 0647   GFRNONAA >60 05/11/2019 0647   GFRAA >60 05/11/2019 0647   COAG Lab Results  Component Value Date   INR 1.2 05/05/2019     Disposition:  Discharge to :Rehab Discharge Instructions    Call MD for:  redness, tenderness, or signs of infection (pain, swelling, bleeding, redness, odor or green/yellow discharge around incision site)   Complete by: As directed    Call MD for:  redness, tenderness, or signs of infection (pain, swelling, bleeding, redness, odor or green/yellow discharge around incision site)   Complete by: As directed    Call MD for:  severe or increased pain, loss or decreased feeling  in affected limb(s)   Complete by: As directed    Call MD for:  severe or increased pain, loss or decreased feeling  in affected limb(s)   Complete by: As directed    Call MD for:  temperature >100.5   Complete by: As directed    Call MD for:  temperature >100.5   Complete by: As directed    Resume previous diet   Complete by: As directed    Resume previous diet   Complete  by: As directed      Allergies as of 05/16/2019   No Known Allergies     Medication List    TAKE these medications   amLODipine 10 MG tablet Commonly known as: NORVASC Take 10 mg by mouth daily.   aspirin EC 81 MG tablet Take 81 mg by mouth daily.   clopidogrel 75 MG tablet Commonly known as: PLAVIX Take 1 tablet (75 mg total) by mouth daily with breakfast.   docusate sodium 100 MG capsule Commonly known as: COLACE Take 1 capsule (100 mg total) by mouth 2 (two) times daily.   gabapentin 300 MG capsule Commonly known as: NEURONTIN Take  1 capsule (300 mg total) by mouth 3 (three) times daily. What changed:   medication strength  how much to take  when to take this   Humira Pen 40 MG/0.4ML Pnkt Generic drug: Adalimumab Inject 40 mg into the skin every 14 (fourteen) days.   lisinopril-hydrochlorothiazide 20-12.5 MG tablet Commonly known as: ZESTORETIC Take 2 tablets by mouth daily.   oxyCODONE-acetaminophen 5-325 MG tablet Commonly known as: Percocet Take 2 tablets by mouth every 6 (six) hours as needed for up to 30 doses for severe pain. What changed: Another medication with the same name was added. Make sure you understand how and when to take each.   oxyCODONE-acetaminophen 5-325 MG tablet Commonly known as: PERCOCET/ROXICET Take 1 tablet by mouth every 4 (four) hours as needed for moderate pain. What changed: You were already taking a medication with the same name, and this prescription was added. Make sure you understand how and when to take each.   Rivaroxaban 15 & 20 MG Tbpk Take as directed on package: Start with one 15mg  tablet by mouth twice a day with food. On Day 22, switch to one 20mg  tablet once a day with food.   sildenafil 20 MG tablet Commonly known as: REVATIO Take 80 mg by mouth as needed (sexual activity).   triamcinolone ointment 0.1 % Commonly known as: KENALOG Apply 1 application topically once a week.      Verbal and written Discharge instructions given to the patient. Wound care per Discharge AVS Follow-up Information    Waynetta Sandy, MD Follow up in 3 week(s).   Specialties: Vascular Surgery, Cardiology Why: office will call Contact information: [place] [address] [phone]           Signed: [name] 05/16/2019, 11:48 AM

## 2019-05-16 NOTE — Discharge Instructions (Signed)
Activity as tolerates.  May shower daily.  Retention sock for mild compression daily.

## 2019-05-16 NOTE — Progress Notes (Addendum)
    Pending Rehab placement and insurance approval Left BKA healing well Lungs non labored breathing  Vitals:   05/16/19 0640 05/16/19 0842  BP: 124/85 129/74  Pulse: 84   Resp: 20   Temp: 97.9 F (36.6 C)   SpO2: 100%     Assessment/Planning: POD # 7 Left BKA Heparin stopped and Xarelto restart today per pharmacy  Pending discharge, stable disposition  [name] PA-C  BKA healing, small amount of drainage medial corner To [facility] today Follow up Donzetta Matters 3-4 weeks  Ruta Hinds, MD [facility] [place] Office: [phone] Pager: [phone]

## 2019-05-16 NOTE — Progress Notes (Signed)
Report given to [facility] inpatient rehab. All questions answered; patient awaiting [facility] transport.

## 2019-05-16 NOTE — TOC Transition Note (Signed)
Transition of Care [facility]) - CM/SW Discharge Note   Patient Details  Name: Dylon Correa MRN: [id number] Date of Birth: 05/11/1971  [facility] CM/SW Contact:  Bethena Roys, RN Phone Number: 05/16/2019, 12:14 PM   Clinical Narrative: D/C summary and MAR faxed to [place]. Staff RN has called report. [facility] called for 12:00 pick up. Medical necessity and face sheet placed on the shadow chart. No further needs from CM at this time.     Final next level of care: IP Rehab Facility Barriers to Discharge: No Barriers Identified   Patient Goals and CMS Choice Patient states their goals for this hospitalization and ongoing recovery are:: "to go to rehab"   Choice offered to / list presented to : NA  Discharge Plan and Services In-house Referral: NA Discharge Planning Services: CM Consult Post Acute Care Choice: IP Rehab              Readmission Risk Interventions No flowsheet data found.

## 2019-05-16 NOTE — Progress Notes (Signed)
Physical Therapy Treatment Patient Details Name: Sean [name] MRN: [id number] DOB: Jun 13, 1971 Today's Date: 05/16/2019    History of Present Illness 48 year old male has undergone multiple left lower extremity revascularizations including bypass followed by lysis followed by attempted mechanical thrombectomy today prior to this.  S/p left BKA    PT Comments    Pt reports having a good nights sleep and less residual limb pain. Agreeable to work with therapy to ambulate prior to discharge to CIR in [employer]. Pt is making progress towards his goals today and is able to ambulate 40 feet with min guard and 1x seated rest break. Education on increased UE usage to decrease stress on R LE. Pt with increased cramping pain in R LE after ambulation.  RN notified.    Follow Up Recommendations  CIR     Equipment Recommendations  Other (comment)(TBA)    Recommendations for Other Services Rehab consult     Precautions / Restrictions Precautions Precautions: Fall Restrictions Weight Bearing Restrictions: Yes LLE Weight Bearing: Non weight bearing    Mobility  Bed Mobility Overal bed mobility: Needs Assistance Bed Mobility: Supine to Sit     Supine to sit: Modified independent (Device/Increase time)     General bed mobility comments: HoB elevated and use of bedrail  Transfers Overall transfer level: Needs assistance Equipment used: Rolling walker (2 wheeled) Transfers: Sit to/from Stand Sit to Stand: Min guard         General transfer comment: min guard for safety, vc for hand placement to power up   Ambulation/Gait Ambulation/Gait assistance: Min guard Gait Distance (Feet): 40 Feet Assistive device: Rolling walker (2 wheeled) Gait Pattern/deviations: Step-to pattern;Decreased stride length Gait velocity: decreased Gait velocity interpretation: <1.31 ft/sec, indicative of household ambulator General Gait Details: min guard for safety, vc for increased UE usage, and  increased R knee flexion with hopping on R LE to increase shock absorption, pt requires 1x seated rest break in hallway due to increased R LE pain          Balance Overall balance assessment: Needs assistance Sitting-balance support: No upper extremity supported;Feet supported Sitting balance-Leahy Scale: Good     Standing balance support: Bilateral upper extremity supported;During functional activity Standing balance-Leahy Scale: Poor Standing balance comment: requires B UE support on RW                             Cognition Arousal/Alertness: Awake/alert Behavior During Therapy: WFL for tasks assessed/performed Overall Cognitive Status: Within Functional Limits for tasks assessed                                           General Comments General comments (skin integrity, edema, etc.): HR max 116 bpm, residual limb staples are clean, dry and in place      Pertinent Vitals/Pain Pain Assessment: 0-10 Pain Score: 7  Pain Location: R LE with ambulation  Pain Descriptors / Indicators: Other (Comment);Aching;Cramping Pain Intervention(s): Limited activity within patient's tolerance;Monitored during session;Repositioned           PT Goals (current goals can now be found in the care plan section) Acute Rehab PT Goals PT Goal Formulation: With patient Time For Goal Achievement: 05/24/19 Potential to Achieve Goals: Good Progress towards PT goals: Progressing toward goals    Frequency    Min 4X/week  PT Plan Current plan remains appropriate       AM-PAC PT "6 Clicks" Mobility   Outcome Measure  Help needed turning from your back to your side while in a flat bed without using bedrails?: A Little Help needed moving from lying on your back to sitting on the side of a flat bed without using bedrails?: A Little Help needed moving to and from a bed to a chair (including a wheelchair)?: A Lot Help needed standing up from a chair using your  arms (e.g., wheelchair or bedside chair)?: A Lot Help needed to walk in hospital room?: A Little Help needed climbing 3-5 steps with a railing? : A Lot 6 Click Score: 15    End of Session Equipment Utilized During Treatment: Gait belt Activity Tolerance: Patient limited by pain Patient left: in bed;with call bell/phone within reach Nurse Communication: Mobility status PT Visit Diagnosis: Other abnormalities of gait and mobility (R26.89);Pain Pain - Right/Left: Left Pain - part of body: Leg     Time: 6967-8938 PT Time Calculation (min) (ACUTE ONLY): 24 min  Charges:  $Gait Training: 23-37 mins                     Sean [name] B. Beverely Risen PT, DPT Acute Rehabilitation Services Pager [phone] Office [phone]    Sean [name] Sean [name] 05/16/2019, 4:58 PM

## 2019-05-26 ENCOUNTER — Encounter: Payer: BLUE CROSS/BLUE SHIELD | Admitting: Vascular Surgery

## 2019-06-02 ENCOUNTER — Telehealth: Payer: Self-pay

## 2019-06-02 NOTE — Telephone Encounter (Signed)
Pt called and said that he only had enough pain medicine to last until Monday. Called and he said that he had probably 8 pills left. Suggested that he save these for severe pain and use otc meds or his Gabapentin for pain instead of the Oxycodone. He stated that he had Tramadol at home as well which is not on his list.   Pt states that he will try to make these last until his follow up next week.   York Cerise, CMA

## 2019-06-09 ENCOUNTER — Ambulatory Visit (INDEPENDENT_AMBULATORY_CARE_PROVIDER_SITE_OTHER): Payer: Self-pay | Admitting: Physician Assistant

## 2019-06-09 ENCOUNTER — Other Ambulatory Visit: Payer: Self-pay

## 2019-06-09 VITALS — BP 150/95 | HR 81 | Temp 98.1°F | Resp 18 | Ht 77.0 in | Wt 270.0 lb

## 2019-06-09 DIAGNOSIS — I739 Peripheral vascular disease, unspecified: Secondary | ICD-10-CM

## 2019-06-09 DIAGNOSIS — Z95828 Presence of other vascular implants and grafts: Secondary | ICD-10-CM

## 2019-06-09 MED ORDER — PREGABALIN 150 MG PO CAPS: 150.0000 mg | ORAL_CAPSULE | Freq: Two times a day (BID) | ORAL | 0 refills | Status: DC

## 2019-06-09 NOTE — Progress Notes (Signed)
POST OPERATIVE OFFICE NOTE    CC:  F/u for surgery  HPI:  This is a 48 y.o. male who is s/p left AKA.  Previous  Left lower extremity percutaneous thrombolysis of vein graft by Dr. Randie Heinz 04/27/19. Recheck lysis and balloon angioplasty of bypass outflow including posterior tibial artery by Dr. Chestine Spore 04/28/19. 09/21/18 Left above-knee popliteal to posterior tibial artery bypass with non-reversed ipsilateral greater saphenous veinwith 4 comparments fasciotomy.  He is here today for f/u and removal of staples left   BKA.  He developed sever pain after the bypass secondary to ischemic toes.  No Known Allergies  Current Outpatient Medications  Medication Sig Dispense Refill  . amLODipine (NORVASC) 10 MG tablet Take 10 mg by mouth daily.    Marland Kitchen aspirin EC 81 MG tablet Take 81 mg by mouth daily.    . clopidogrel (PLAVIX) 75 MG tablet Take 1 tablet (75 mg total) by mouth daily with breakfast. 30 tablet 2  . docusate sodium (COLACE) 100 MG capsule Take 1 capsule (100 mg total) by mouth 2 (two) times daily. 10 capsule 0  . gabapentin (NEURONTIN) 300 MG capsule Take 1 capsule (300 mg total) by mouth 3 (three) times daily. 90 capsule 0  . lisinopril-hydrochlorothiazide (ZESTORETIC) 20-12.5 MG tablet Take 2 tablets by mouth daily.     Marland Kitchen oxyCODONE-acetaminophen (PERCOCET) 5-325 MG tablet Take 2 tablets by mouth every 6 (six) hours as needed for up to 30 doses for severe pain. 30 tablet 0  . Rivaroxaban 15 & 20 MG TBPK Take as directed on package: Start with one 15mg  tablet by mouth twice a day with food. On Day 22, switch to one 20mg  tablet once a day with food. 51 each 0  . sildenafil (REVATIO) 20 MG tablet Take 80 mg by mouth as needed (sexual activity).     . triamcinolone ointment (KENALOG) 0.1 % Apply 1 application topically once a week.    . Adalimumab (HUMIRA PEN) 40 MG/0.4ML PNKT Inject 40 mg into the skin every 14 (fourteen) days.    oxyCODONE-acetaminophen (PERCOCET/ROXICET) 5-325 MG tablet Take 1  tablet by mouth every 4 (four) hours as needed for moderate pain. (Patient not taking: Reported on 06/09/2019) 30 tablet 0  . pregabalin (LYRICA) 150 MG capsule Take 1 capsule (150 mg total) by mouth 2 (two) times daily. 60 capsule 0   No current facility-administered medications for this visit.      ROS:  See HPI  Physical Exam:    Incision:  Left LE well healed BKA, psoriasis medial and posterior skin. Extremities:  Right LE without open wounds good color.  Doppler signal PT, AT, peroneal.    Assessment/Plan:  This is a 48 y.o. male who is s/p:Left lower extremity percutaneous thrombolysis of vein graft by Dr. 06/11/2019 04/27/19. Recheck lysis and balloon angioplasty of bypass outflow including posterior tibial artery by Dr. Randie Heinz 04/28/19.  09/21/18 Left above-knee popliteal to posterior tibial artery bypass with non-reversed ipsilateral greater saphenous vein with 4 compartment fasciotomies by Dr. 06/28/19.    Sever pain with ischemic toes s/p left BKA.  Staples removed, patient tolerated this well.  He will f/u with [facility].  He has reports of phantom pain.  He has been taking Neurontin 300 mg TID.  He is not getting relief.  I have given him a prescription for Lyrica.  He will stop the neurodin and try the Lyrica.  If the Lyrica helps he will ask his primary care physician for refills.  He is scheduled for a f/u visit in Dec. For repeat ABI on the right LE.  If her develops problems, symptoms of claudication or non healing ulcer on the right he will call sooner.   [name] PA-C [facility] [phone]  Clinic MD:  Donzetta Matters

## 2019-07-05 ENCOUNTER — Other Ambulatory Visit: Payer: Self-pay | Admitting: *Deleted

## 2019-07-05 DIAGNOSIS — Z95828 Presence of other vascular implants and grafts: Secondary | ICD-10-CM

## 2019-07-05 MED ORDER — PREGABALIN 150 MG PO CAPS: 150.0000 mg | ORAL_CAPSULE | Freq: Two times a day (BID) | ORAL | 0 refills | Status: DC

## 2019-08-07 ENCOUNTER — Telehealth: Payer: Self-pay | Admitting: *Deleted

## 2019-08-07 ENCOUNTER — Ambulatory Visit: Payer: BLUE CROSS/BLUE SHIELD

## 2019-08-07 ENCOUNTER — Encounter (HOSPITAL_COMMUNITY): Payer: BLUE CROSS/BLUE SHIELD

## 2019-08-07 NOTE — Telephone Encounter (Signed)
Virtual Visit Pre-Appointment Phone Call  Today, I spoke with Sean [name] and performed the following actions:  1. I explained that we are currently trying to limit exposure to the COVID-19 virus by seeing patients at home rather than in the office.  I explained that the visits are best done by video, but can be done by telephone.  I asked the patient if a virtual visit that the patient would like to try instead of coming into the office. Sean [name] agreed to proceed with the virtual visit scheduled with on site PA  on 08/11/19.    2. I confirmed the BEST phone number to call the day of the visit and- I included this in appointment notes.  3. I asked if the patient had access to (through a family member/friend) a smartphone with video capability to be used for his visit?"  The patient said yes -   4. I confirmed consent by  a. sending through [place] or by email the [place] as written at the end of this message or  b. verbally as listed below. i. This visit is being performed in the setting of COVID-19. ii. All virtual visits are billed to your insurance company just like a normal visit would be.   iii. We'd like you to understand that the technology does not allow for your provider to perform an examination, and thus may limit your provider's ability to fully assess your condition.  iv. If your provider identifies any concerns that need to be evaluated in person, we will make arrangements to do so.   v. Finally, though the technology is pretty good, we cannot assure that it will always work on either your or our end, and in the setting of a video visit, we may have to convert it to a phone-only visit.  In either situation, we cannot ensure that we have a secure connection.   vi. Are you willing to proceed?"  STAFF: Did the patient verbally acknowledge consent to telehealth visit? Document YES/NO here: YES  2. I advised the patient to be prepared  - I asked that the patient, on the day of his visit, record any information possible with the equipment at his home, such as blood pressure, pulse, oxygen saturation, and your weight and write them all down. I asked the patient to have a pen and paper handy nearby the day of the visit as well.  3. If the patient was scheduled for a video visit, I informed the patient that the visit with the doctor would start with a text to the smartphone # given to [place] by the patient.         If the patient was scheduled for a telephone call, I informed the patient that the visit with the doctor would start with a call to the telephone # given to [place] by the patient.  4. I Informed patient they will receive a phone call 15 minutes prior to their appointment time from a [place] or nurse to review medications, allergies, etc. to prepare for the visit.    TELEPHONE CALL NOTE  Sean [name] has been deemed a candidate for a follow-up tele-health visit to limit community exposure during the Covid-19 pandemic. I spoke with the patient via phone to ensure availability of phone/video source, confirm preferred email & phone number, and discuss instructions and expectations.  I reminded Sean [name] to be prepared with any vital sign and/or heart rhythm information that  could potentially be obtained via home monitoring, at the time of his visit. I reminded Sean [name] to expect a phone call prior to his visit.  Sean [name], NT 08/07/2019 3:35 PM     FULL LENGTH CONSENT FOR TELE-HEALTH VISIT   I hereby voluntarily request, consent and authorize [facility] and its employed or contracted physicians, physician assistants, nurse practitioners or other licensed health care professionals (the Practitioner), to provide me with telemedicine health care services (the "Services") as deemed necessary by the treating Practitioner. I acknowledge and consent to receive the Services by the Practitioner via telemedicine. I  understand that the telemedicine visit will involve communicating with the Practitioner through live audiovisual communication technology and the disclosure of certain medical information by electronic transmission. I acknowledge that I have been given the opportunity to request an in-person assessment or other available alternative prior to the telemedicine visit and am voluntarily participating in the telemedicine visit.  I understand that I have the right to withhold or withdraw my consent to the use of telemedicine in the course of my care at any time, without affecting my right to future care or treatment, and that the Practitioner or I may terminate the telemedicine visit at any time. I understand that I have the right to inspect all information obtained and/or recorded in the course of the telemedicine visit and may receive copies of available information for a reasonable fee.  I understand that some of the potential risks of receiving the Services via telemedicine include:  Marland Kitchen Delay or interruption in medical evaluation due to technological equipment failure or disruption; . Information transmitted may not be sufficient (e.g. poor resolution of images) to allow for appropriate medical decision making by the Practitioner; and/or  . In rare instances, security protocols could fail, causing a breach of personal health information.  Furthermore, I acknowledge that it is my responsibility to provide information about my medical history, conditions and care that is complete and accurate to the best of my ability. I acknowledge that Practitioner's advice, recommendations, and/or decision may be based on factors not within their control, such as incomplete or inaccurate data provided by me or distortions of diagnostic images or specimens that may result from electronic transmissions. I understand that the practice of medicine is not an exact science and that Practitioner makes no warranties or guarantees  regarding treatment outcomes. I acknowledge that I will receive a copy of this consent concurrently upon execution via email to the email address I last provided but may also request a printed copy by calling the office of [facility].    I understand that my insurance will be billed for this visit.   I have read or had this consent read to me. . I understand the contents of this consent, which adequately explains the benefits and risks of the Services being provided via telemedicine.  . I have been provided ample opportunity to ask questions regarding this consent and the Services and have had my questions answered to my satisfaction. . I give my informed consent for the services to be provided through the use of telemedicine in my medical care  By participating in this telemedicine visit I agree to the above.

## 2019-08-10 ENCOUNTER — Other Ambulatory Visit: Payer: Self-pay

## 2019-08-10 DIAGNOSIS — I779 Disorder of arteries and arterioles, unspecified: Secondary | ICD-10-CM

## 2019-08-11 ENCOUNTER — Other Ambulatory Visit (HOSPITAL_COMMUNITY): Payer: Self-pay | Admitting: Vascular Surgery

## 2019-08-11 ENCOUNTER — Ambulatory Visit (INDEPENDENT_AMBULATORY_CARE_PROVIDER_SITE_OTHER)
Admission: RE | Admit: 2019-08-11 | Discharge: 2019-08-11 | Disposition: A | Discharge status: Home / Self Care | Payer: BLUE CROSS/BLUE SHIELD | Source: Ambulatory Visit | Attending: Family | Admitting: Family

## 2019-08-11 ENCOUNTER — Ambulatory Visit (INDEPENDENT_AMBULATORY_CARE_PROVIDER_SITE_OTHER): Payer: BLUE CROSS/BLUE SHIELD | Admitting: Physician Assistant

## 2019-08-11 ENCOUNTER — Other Ambulatory Visit: Payer: Self-pay

## 2019-08-11 ENCOUNTER — Ambulatory Visit (HOSPITAL_COMMUNITY)
Admission: RE | Admit: 2019-08-11 | Discharge: 2019-08-11 | Disposition: A | Discharge status: Home / Self Care | Payer: BLUE CROSS/BLUE SHIELD | Source: Ambulatory Visit | Attending: Family | Admitting: Family

## 2019-08-11 DIAGNOSIS — I779 Disorder of arteries and arterioles, unspecified: Secondary | ICD-10-CM | POA: Diagnosis not present

## 2019-08-11 DIAGNOSIS — I739 Peripheral vascular disease, unspecified: Secondary | ICD-10-CM | POA: Insufficient documentation

## 2019-08-11 DIAGNOSIS — Z89512 Acquired absence of left leg below knee: Secondary | ICD-10-CM

## 2019-08-11 NOTE — Progress Notes (Signed)
Virtual Visit via Telephone Note   I connected with Sean [name] on 08/11/2019 using the [employer] by telephone and verified that I was speaking with the correct person using two identifiers. Patient was located at home and accompanied by his wife. I am located at [facility] office.   The limitations of evaluation and management by telemedicine and the availability of in person appointments have been previously discussed with the patient and are documented in the patients chart. The patient expressed understanding and consented to proceed.  PCP: Patient, No Pcp Per  Chief Complaint: follow up for PAD  History of Present Illness: Sean [name] is a 48 y.o. male with follow-up for PAD having had vascular studies.  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  Patient states left BKA incision is well-healed and he recently received his prosthetic and is starting to walk more.  He denies any right lower extremity claudication, rest pain, or nonhealing wounds of right foot.  He is on aspirin and Plavix daily.  He denies tobacco use.  Past Medical History:  Diagnosis Date  . Hypertension   . PVD (peripheral vascular disease) ([place])     Past Surgical History:  Procedure Laterality Date  . ABDOMINAL AORTOGRAM W/LOWER EXTREMITY N/A 05/08/2019   Procedure: ABDOMINAL AORTOGRAM W/LOWER EXTREMITY;  Surgeon: Waynetta Sandy, MD;  Location: [place] [facility];  Service: Cardiovascular;  Laterality: N/A;  . ANGIOPLASTY Left 04/27/2019   Procedure: Balloon Angioplasty of left posterior tibial artery;  Surgeon: Waynetta Sandy, MD;  Location: [place];  Service: Vascular;  Laterality: Left;  . EMBOLECTOMY Left 09/21/2018   Procedure: THROMBECTOMY LEFT POPLITEAL ARTERY BYPASS LEFT POPLITEAL TO TIBIAL ARTERY WITH SAPHENOUS VEIN HARVEST - FASCIOTOMY;  Surgeon: Angelia Mould, MD;   Location: [place];  Service: Vascular;  Laterality: Left;  . LOWER EXTREMITY ANGIOGRAM Left 04/27/2019   Procedure: LEFT LOWER EXTREMITY ANGIOGRAM;  Surgeon: Waynetta Sandy, MD;  Location: [place];  Service: Vascular;  Laterality: Left;  . PERIPHERAL VASCULAR BALLOON ANGIOPLASTY Left 05/08/2019   Procedure: PERIPHERAL VASCULAR BALLOON ANGIOPLASTY;  Surgeon: Waynetta Sandy, MD;  Location: [place] [facility];  Service: Cardiovascular;  Laterality: Left;  Posterior tibial  . PERIPHERAL VASCULAR INTERVENTION Left 04/28/2019   Procedure: PERIPHERAL VASCULAR INTERVENTION;  Surgeon: Marty Heck, MD;  Location: [place] [facility];  Service: Cardiovascular;  Laterality: Left;  . PERIPHERAL VASCULAR THROMBECTOMY Bilateral 04/28/2019   Procedure: PERIPHERAL VASCULAR THROMBECTOMY;  Surgeon: Marty Heck, MD;  Location: [place] [facility];  Service: Cardiovascular;  Laterality: Bilateral;  . PERIPHERAL VASCULAR THROMBECTOMY Left 05/08/2019   Procedure: PERIPHERAL VASCULAR THROMBECTOMY;  Surgeon: Waynetta Sandy, MD;  Location: [place] [facility];  Service: Cardiovascular;  Laterality: Left;  posterior tibial  . TRANSMETATARSAL AMPUTATION Left 05/09/2019   Procedure: Left Below Knee Amputation;  Surgeon: Waynetta Sandy, MD;  Location: [place];  Service: Vascular;  Laterality: Left;    Current Meds  Medication Sig  . Adalimumab (HUMIRA PEN) 40 MG/0.4ML PNKT Inject 40 mg into the skin every 14 (fourteen) days.  Marland Kitchen amLODipine (NORVASC) 10 MG tablet Take 10 mg by mouth daily.  Marland Kitchen aspirin EC 81 MG tablet Take 81 mg by mouth daily.  . clopidogrel (PLAVIX) 75 MG tablet Take 1 tablet (75 mg total) by mouth daily with breakfast.  . docusate sodium (COLACE) 100 MG capsule Take 1  capsule (100 mg total) by mouth 2 (two) times daily.  Marland Kitchen gabapentin (NEURONTIN) 300 MG capsule Take 1 capsule (300 mg total) by mouth 3 (three) times daily.  Marland Kitchen lisinopril-hydrochlorothiazide  (ZESTORETIC) 20-12.5 MG tablet Take 2 tablets by mouth daily.   . pregabalin (LYRICA) 150 MG capsule Take 1 capsule (150 mg total) by mouth 2 (two) times daily.  . Rivaroxaban 15 & 20 MG TBPK Take as directed on package: Start with one 15mg  tablet by mouth twice a day with food. On Day 22, switch to one 20mg  tablet once a day with food.  . sildenafil (REVATIO) 20 MG tablet Take 80 mg by mouth as needed (sexual activity).   . triamcinolone ointment (KENALOG) 0.1 % Apply 1 application topically once a week.    12 system ROS was negative unless otherwise noted in HPI   Observations/Objective: Patient denies any wounds of right foot  -Right ABI 0.7 unable to obtain toe pressure essentially unchanged from 3 months prior -Right lower extremity arterial duplex without any hemodynamically significant stenosis besides the mention of an occluded DP  Assessment and Plan: Left BKA well-healed and now walking with a prosthetic No rest pain or tissue loss right lower extremity Continue Plavix and aspirin Follow regularly with PCP for management of hypertension Recheck ABI in 1 year; follow-up sooner if rest pain or tissue loss develops  Follow Up Instructions:   Follow up in 1 year(s)   I discussed the assessment and treatment plan with the patient. The patient was provided an opportunity to ask questions and all were answered. The patient agreed with the plan and demonstrated an understanding of the instructions.   The patient was advised to call back or seek an in-person evaluation if the symptoms worsen or if the condition fails to improve as anticipated.  I spent 15 minutes with the patient via telephone encounter.   Signed, [facility] Office: [phone]  08/11/2019, 2:06 PM

## 2019-08-28 ENCOUNTER — Other Ambulatory Visit: Payer: Self-pay | Admitting: *Deleted

## 2019-08-28 DIAGNOSIS — I739 Peripheral vascular disease, unspecified: Secondary | ICD-10-CM

## 2019-09-12 DIAGNOSIS — G546 Phantom limb syndrome with pain: Secondary | ICD-10-CM | POA: Insufficient documentation

## 2019-09-12 HISTORY — DX: Phantom limb syndrome with pain: G54.6

## 2019-11-10 DIAGNOSIS — M792 Neuralgia and neuritis, unspecified: Secondary | ICD-10-CM | POA: Insufficient documentation

## 2019-11-10 HISTORY — DX: Neuralgia and neuritis, unspecified: M79.2

## 2020-01-18 ENCOUNTER — Encounter: Payer: Self-pay | Admitting: Physician Assistant

## 2020-01-18 ENCOUNTER — Ambulatory Visit (INDEPENDENT_AMBULATORY_CARE_PROVIDER_SITE_OTHER): Payer: Self-pay | Admitting: Physician Assistant

## 2020-01-18 ENCOUNTER — Other Ambulatory Visit: Payer: Self-pay

## 2020-01-18 ENCOUNTER — Telehealth: Payer: Self-pay

## 2020-01-18 VITALS — BP 133/82 | HR 115 | Temp 97.4°F | Resp 20 | Ht 77.0 in | Wt 270.0 lb

## 2020-01-18 DIAGNOSIS — M79605 Pain in left leg: Secondary | ICD-10-CM

## 2020-01-18 MED ORDER — CEPHALEXIN 500 MG PO CAPS: 500.0000 mg | ORAL_CAPSULE | Freq: Three times a day (TID) | ORAL | 0 refills | Status: DC

## 2020-01-18 NOTE — Progress Notes (Signed)
Office Note     CC:  follow up Requesting Provider:  No ref. provider found  HPI: Sean [name] is a 49 y.o. (1971-04-30) male who presents for evaluation of left BKA stump wound. He states that over the past week he started developing swelling and a lot of pain in the left BKA stump that is hurting throughout the day and keeping him awake at night. The pain and tenderness is from the knee down to end of the stump. He takes tramadol every morning routinely and has been taking extra strength tylenol every 4 hours without much relief. He initially presented to [facility] thinking it was a prosthetic issue but they said he needed to follow up with PCP or vascular. He went to his PCP on 01/15/20. He had CBC drawn there which was essentially normal but they advised him to follow up with [place] because there was nothing they could do to help him. He denies any fever or chills  His last appointment in December 2020 was a Telemedicine visit with [facility]. His left BKA incision was well-healed and he had recently received his prosthetic and was starting to walk more.  He was not having any right lower extremity claudication, rest pain, or nonhealing wounds of right foot.  He is on aspirin and Plavix daily.  At time of previous visit he was not smoking but he resumed smoking in February 2021.  He has additionally noticed some claudication symptoms in the right leg over the past couple months when ambulating with cane and prosthetic. No rest pain or non healing wounds of the right lower extremity  The pt not on a statin for cholesterol management.  The pt is on a daily aspirin.   Other AC:  plavix The pt is on CCB and Zestoretic for hypertension.   The pt is diabetic.  Tobacco hx: Current, 1 ppd  Past Medical History:  Diagnosis Date  . Hypertension   . PVD (peripheral vascular disease) ([facility])     Past Surgical History:  Procedure Laterality Date  . ABDOMINAL AORTOGRAM W/LOWER EXTREMITY N/A  05/08/2019   Procedure: ABDOMINAL AORTOGRAM W/LOWER EXTREMITY;  Surgeon: Maeola Harman, MD;  Location: [facility] INVASIVE [facility];  Service: Cardiovascular;  Laterality: N/A;  . ANGIOPLASTY Left 04/27/2019   Procedure: Balloon Angioplasty of left posterior tibial artery;  Surgeon: Maeola Harman, MD;  Location: [facility] OR;  Service: Vascular;  Laterality: Left;  . EMBOLECTOMY Left 09/21/2018   Procedure: THROMBECTOMY LEFT POPLITEAL ARTERY BYPASS LEFT POPLITEAL TO TIBIAL ARTERY WITH SAPHENOUS VEIN HARVEST - FASCIOTOMY;  Surgeon: Chuck Hint, MD;  Location: [facility] OR;  Service: Vascular;  Laterality: Left;  . LOWER EXTREMITY ANGIOGRAM Left 04/27/2019   Procedure: LEFT LOWER EXTREMITY ANGIOGRAM;  Surgeon: Maeola Harman, MD;  Location: [facility];  Service: Vascular;  Laterality: Left;  . PERIPHERAL VASCULAR BALLOON ANGIOPLASTY Left 05/08/2019   Procedure: PERIPHERAL VASCULAR BALLOON ANGIOPLASTY;  Surgeon: Maeola Harman, MD;  Location: [facility] IN[facility]AS[facility] LAB;  Service: Cardiovascular;  Laterality: Left;  Posterior tibial  . PERIPHERAL VASCULAR INTERVENTION Left 04/28/2019   Procedure: PERIPHERAL VASCULAR INTERVENTION;  Surgeon: Cephus Shelling, MD;  Location: [facility] INVASIVE [facility] LAB;  Service: Cardiovascular;  Laterality: Left;  . PERIPHERAL VASCULAR THROMBECTOMY Bilateral 04/28/2019   Procedure: PERIPHERAL VASCULAR THROMBECTOMY;  Surgeon: Cephus Shelling, MD;  Location: [facility] INVASIVE [facility] LAB;  Service: Cardiovascular;  Laterality: Bilateral;  . PERIPHERAL VASCULAR THROMBECTOMY Left 05/08/2019   Procedure: PERIPHERAL VASCULAR THROMBECTOMY;  Surgeon:  Maeola Harman, MD;  Location: [facility] INVASIVE CV LAB;  Service: Cardiovascular;  Laterality: Left;  posterior tibial  . TRANSMETATARSAL AMPUTATION Left 05/09/2019   Procedure: Left Below Knee Amputation;  Surgeon: Maeola Harman, MD;  Location: [facility] OR;  Service: Vascular;  Laterality: Left;    Social History    Socioeconomic History  . Marital status: Married    Spouse name: Not on file  . Number of children: Not on file  . Years of education: Not on file  . Highest education level: Not on file  Occupational History  . Not on file  Tobacco Use  . Smoking status: Current Every Day Smoker    Packs/day: 1.00    Types: Cigarettes  . Smokeless tobacco: Never Used  Substance and Sexual Activity  . Alcohol use: Never  . Drug use: Never  . Sexual activity: Not on file  Other Topics Concern  . Not on file  Social History Narrative  . Not on file   Social Determinants of Health   Financial Resource Strain:   . Difficulty of Paying Living Expenses:   Food Insecurity:   . Worried About Programme researcher, broadcasting/film/video in the Last Year:   . Barista in the Last Year:   Transportation Needs:   . Freight forwarder (Medical):   Marland Kitchen Lack of Transportation (Non-Medical):   Physical Activity:   . Days of Exercise per Week:   . Minutes of Exercise per Session:   Stress:   . Feeling of Stress :   Social Connections:   . Frequency of Communication with Friends and Family:   . Frequency of Social Gatherings with Friends and Family:   . Attends Religious Services:   . Active Member of Clubs or Organizations:   . Attends Banker Meetings:   Marland Kitchen Marital Status:   Intimate Partner Violence:   . Fear of Current or Ex-Partner:   . Emotionally Abused:   Marland Kitchen Physically Abused:   . Sexually Abused:     Family History  Problem Relation Age of Onset  . Diabetes Father     Current Outpatient Medications  Medication Sig Dispense Refill  . Adalimumab (HUMIRA PEN) 40 MG/0.4ML PNKT Inject 40 mg into the skin every 14 (fourteen) days.    Marland Kitchen amLODipine (NORVASC) 10 MG tablet Take 10 mg by mouth daily.    Marland Kitchen aspirin EC 81 MG tablet Take 81 mg by mouth daily.    . bisacodyl (DULCOLAX) 5 MG EC tablet TAKE 1 TABLET BY MOUTH DAILY AS NEEDED FOR CONSTIPATION    . clopidogrel (PLAVIX) 75 MG tablet  Take 1 tablet (75 mg total) by mouth daily with breakfast. 30 tablet 2  . docusate sodium (COLACE) 100 MG capsule Take 1 capsule (100 mg total) by mouth 2 (two) times daily. 10 capsule 0  . DULoxetine (CYMBALTA) 30 MG capsule TAKE 1 CAPSULE BY MOUTH EVERY DAY    . gabapentin (NEURONTIN) 300 MG capsule Take 1 capsule (300 mg total) by mouth 3 (three) times daily. 90 capsule 0  . lisinopril-hydrochlorothiazide (ZESTORETIC) 20-12.5 MG tablet Take 2 tablets by mouth daily.     . pregabalin (LYRICA) 150 MG capsule Take 1 capsule (150 mg total) by mouth 2 (two) times daily. 60 capsule 0  . Rivaroxaban 15 & 20 MG TBPK Take as directed on package: Start with one 15mg  tablet by mouth twice a day with food. On Day 22, switch to one 20mg  tablet once a  day with food. 51 each 0  . sildenafil (REVATIO) 20 MG tablet Take 80 mg by mouth as needed (sexual activity).     . traMADol (ULTRAM) 50 MG tablet Take by mouth.    . triamcinolone ointment (KENALOG) 0.1 % Apply 1 application topically once a week.     No current facility-administered medications for this visit.    No Known Allergies   REVIEW OF SYSTEMS:  [X]  denotes positive finding, [ ]  denotes negative finding Cardiac  Comments:  Chest pain or chest pressure:    Shortness of breath upon exertion:    Short of breath when lying flat:    Irregular heart rhythm:        Vascular    Pain in calf, thigh, or hip brought on by ambulation: X   Pain in feet at night that wakes you up from your sleep:     Blood clot in your veins:    Leg swelling:         Pulmonary    Oxygen at home:    Productive cough:     Wheezing:         Neurologic    Sudden weakness in arms or legs:     Sudden numbness in arms or legs:     Sudden onset of difficulty speaking or slurred speech:    Temporary loss of vision in one eye:     Problems with dizziness:         Gastrointestinal    Blood in stool:     Vomited blood:         Genitourinary    Burning when  urinating:     Blood in urine:        Psychiatric    Major depression:         Hematologic    Bleeding problems:    Problems with blood clotting too easily:        Skin    Rashes or ulcers:        Constitutional    Fever or chills:      PHYSICAL EXAMINATION:  Vitals:   01/18/20 1451  BP: 133/82  Pulse: (!) 115  Resp: 20  Temp: (!) 97.4 F (36.3 C)  SpO2: 96%  Weight: 270 lb (122.5 kg)  Height: 6\' 5"  (1.956 m)    General:  WDWN in NAD; vital signs documented above Gait: Not observed, in wheel chair HENT: WNL, normocephalic Pulmonary: normal non-labored breathing , without Rales, rhonchi,  wheezing Cardiac: regular HR, without  Murmurs without carotid bruit Abdomen: soft, NT, no masses Skin: without rashes Vascular Exam/Pulses: 2+ femoral pulses bilaterally, right leg and foot warm, no palpable distal pulses.  Left BKA stump boggy and very tender along anterior and posterior flaps to level of knee joint. No overt erythema or warmth Extremities: without ischemic changes, without Gangrene , without cellulitis; without open wounds;  Musculoskeletal: no muscle wasting or atrophy  Neurologic: A&O X 3;  No focal weakness or paresthesias are detected Psychiatric:  The pt has Normal affect.   ASSESSMENT/PLAN:: 49 y.o. male here for follow up for left BKA stump site pain and swelling. Some concerns for osteomyelitis vs ischemia of the distal stump. He does have some claudication symptoms of right lower extremity that need to be further evaluated with non invasive studies which I will schedule at his next follow up - Encouraged him to try to elevate it on a pillow as tolerated to reduce swelling - I  have ordered an xray of his Tibia/fibula to assess his distal BKA stump - started him on Keflex 500 mg BID x 7 days - he will follow up in one week   Graceann Congress, PA-C [facility] [phone]  Clinic MD: Dr. Edilia Bo

## 2020-01-18 NOTE — Telephone Encounter (Signed)
Pt called to report increased pain in L BKA stump, also reports swelling and cold to touch X 1 wk, pt states he can't sleep at night as pain is 10/10 constantly, denies fever. Reports taking lyrica and tramadol with no relief. Appointment made for pt to see PA in clinic today.- s/p L BKA 05/09/19 by Dr. Randie Heinz

## 2020-01-19 ENCOUNTER — Ambulatory Visit (HOSPITAL_COMMUNITY)
Admission: RE | Admit: 2020-01-19 | Discharge: 2020-01-19 | Disposition: A | Discharge status: Home / Self Care | Payer: BLUE CROSS/BLUE SHIELD | Source: Ambulatory Visit | Attending: Physician Assistant | Admitting: Physician Assistant

## 2020-01-19 DIAGNOSIS — M79605 Pain in left leg: Secondary | ICD-10-CM | POA: Diagnosis not present

## 2020-01-24 ENCOUNTER — Emergency Department (HOSPITAL_COMMUNITY)
Admission: EM | Admit: 2020-01-24 | Discharge: 2020-01-24 | Disposition: A | Discharge status: Home / Self Care | Payer: BLUE CROSS/BLUE SHIELD | Attending: Emergency Medicine | Admitting: Emergency Medicine

## 2020-01-24 ENCOUNTER — Encounter (HOSPITAL_COMMUNITY): Payer: Self-pay | Admitting: Emergency Medicine

## 2020-01-24 DIAGNOSIS — Z89512 Acquired absence of left leg below knee: Secondary | ICD-10-CM | POA: Diagnosis not present

## 2020-01-24 DIAGNOSIS — F17210 Nicotine dependence, cigarettes, uncomplicated: Secondary | ICD-10-CM | POA: Diagnosis not present

## 2020-01-24 DIAGNOSIS — M79605 Pain in left leg: Secondary | ICD-10-CM | POA: Diagnosis not present

## 2020-01-24 DIAGNOSIS — Z79899 Other long term (current) drug therapy: Secondary | ICD-10-CM | POA: Insufficient documentation

## 2020-01-24 DIAGNOSIS — M79609 Pain in unspecified limb: Secondary | ICD-10-CM

## 2020-01-24 DIAGNOSIS — T8789 Other complications of amputation stump: Secondary | ICD-10-CM

## 2020-01-24 DIAGNOSIS — I739 Peripheral vascular disease, unspecified: Secondary | ICD-10-CM | POA: Diagnosis not present

## 2020-01-24 DIAGNOSIS — I10 Essential (primary) hypertension: Secondary | ICD-10-CM | POA: Insufficient documentation

## 2020-01-24 LAB — CBC WITH DIFFERENTIAL/PLATELET
Abs Immature Granulocytes: 0.04 10*3/uL (ref 0.00–0.07)
Basophils Absolute: 0 10*3/uL (ref 0.0–0.1)
Basophils Relative: 0 %
Eosinophils Absolute: 0.2 10*3/uL (ref 0.0–0.5)
Eosinophils Relative: 2 %
HCT: 49.4 % (ref 39.0–52.0)
Hemoglobin: 16 g/dL (ref 13.0–17.0)
Immature Granulocytes: 0 %
Lymphocytes Relative: 35 %
Lymphs Abs: 3.5 10*3/uL (ref 0.7–4.0)
MCH: 25.8 pg — ABNORMAL LOW (ref 26.0–34.0)
MCHC: 32.4 g/dL (ref 30.0–36.0)
MCV: 79.5 fL — ABNORMAL LOW (ref 80.0–100.0)
Monocytes Absolute: 0.7 10*3/uL (ref 0.1–1.0)
Monocytes Relative: 7 %
Neutro Abs: 5.4 10*3/uL (ref 1.7–7.7)
Neutrophils Relative %: 56 %
Platelets: 342 10*3/uL (ref 150–400)
RBC: 6.21 MIL/uL — ABNORMAL HIGH (ref 4.22–5.81)
RDW: 15.2 % (ref 11.5–15.5)
WBC: 10 10*3/uL (ref 4.0–10.5)
nRBC: 0 % (ref 0.0–0.2)

## 2020-01-24 LAB — BASIC METABOLIC PANEL
Anion gap: 11 (ref 5–15)
BUN: 9 mg/dL (ref 6–20)
CO2: 27 mmol/L (ref 22–32)
Calcium: 9.8 mg/dL (ref 8.9–10.3)
Chloride: 103 mmol/L (ref 98–111)
Creatinine, Ser: 0.96 mg/dL (ref 0.61–1.24)
GFR calc Af Amer: >60 mL/min (ref >60)
GFR calc non Af Amer: >60 mL/min (ref >60)
Glucose, Bld: 110 mg/dL — ABNORMAL HIGH (ref 70–99)
Potassium: 4.4 mmol/L (ref 3.5–5.1)
Sodium: 141 mmol/L (ref 135–145)

## 2020-01-24 MED ORDER — HYDROCODONE-ACETAMINOPHEN 5-325 MG PO TABS: 1.0000 | ORAL_TABLET | Freq: Four times a day (QID) | ORAL | 0 refills | Status: DC | PRN

## 2020-01-24 MED ORDER — HYDROCODONE-ACETAMINOPHEN 5-325 MG PO TABS
1.0000 | ORAL_TABLET | Freq: Once | ORAL | Status: AC
  Administered 2020-01-24: 1 via ORAL
  Filled 2020-01-24: qty 1

## 2020-01-24 NOTE — ED Provider Notes (Signed)
[facility] EMERGENCY DEPARTMENT Provider Note   CSN: [id number] Arrival date & time: 01/24/20  0018     History Chief Complaint  Patient presents with  . Stump Pain    Sean [name] is a 49 y.o. male.  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  This is been going on for several weeks.  Denies injury or trauma.  Has seen his prosthetic specialist as well as primary medical doctor as well as vascular specialist again.  Most recently at his vascular visit on 27 May there was a concern for osteomyelitis versus possible ischemia.  He was started on Keflex and an x-ray was performed which showed no evidence of cellulitis but did show soft tissue swelling.  He reports that he has follow-up with vascular this Thursday but last night he could not sleep due to the pain so his wife called 911 and brought him to the emergency department.  Denies any fevers, chills.  His pain is mostly in the distal portion of his stump.  Takes tramadol regularly which does not seem to be helping.        Past Medical History:  Diagnosis Date  . Hypertension   . PVD (peripheral vascular disease) [facility])     Patient Active Problem List   Diagnosis Date Noted  . History of left below knee amputation (HCC) 05/16/2019  . Ischemia of left lower extremity 04/28/2019  . PAD (peripheral artery disease) (HCC) 04/28/2019  . Ischemia of extremity 09/21/2018  . PVD (peripheral vascular disease) (HCC) 09/20/2018    Past Surgical History:  Procedure Laterality Date  . ABDOMINAL AORTOGRAM W/LOWER EXTREMITY N/A 05/08/2019   Procedure: ABDOMINAL AORTOGRAM W/LOWER EXTREMITY;  Surgeon: Maeola Harman, MD;  Location: [facility] INVASIVE CV LAB;  Service: Cardiovascular;  Laterality: N/A;  . ANGIOPLASTY Left 04/27/2019   Procedure: Balloon Angioplasty of left posterior tibial  artery;  Surgeon: Maeola Harman, MD;  Location: [facility] OR;  Service: Vascular;  Laterality: Left;  . EMBOLECTOMY Left 09/21/2018   Procedure: THROMBECTOMY LEFT POPLITEAL ARTERY BYPASS LEFT POPLITEAL TO TIBIAL ARTERY WITH SAPHENOUS VEIN HARVEST - FASCIOTOMY;  Surgeon: Chuck Hint, MD;  Location: [facility] OR;  Service: Vascular;  Laterality: Left;  . LOWER EXTREMITY ANGIOGRAM Left 04/27/2019   Procedure: LEFT LOWER EXTREMITY ANGIOGRAM;  Surgeon: Maeola Harman, MD;  Location: [facility] OR;  Service: Vascular;  Laterality: Left;  . PERIPHERAL VASCULAR BALLOON ANGIOPLASTY Left 05/08/2019   Procedure: PERIPHERAL VASCULAR BALLOON ANGIOPLASTY;  Surgeon: Maeola Harman, MD;  Location: [facility] INVASIVE CV LAB;  Service: Cardiovascular;  Laterality: Left;  Posterior tibial  . PERIPHERAL VASCULAR INTERVENTION Left 04/28/2019   Procedure: PERIPHERAL VASCULAR INTERVENTION;  Surgeon: Cephus Shelling, MD;  Location: [facility] INVASIVE CV LAB;  Service: Cardiovascular;  Laterality: Left;  . PERIPHERAL VASCULAR THROMBECTOMY Bilateral 04/28/2019   Procedure: PERIPHERAL VASCULAR THROMBECTOMY;  Surgeon: Cephus Shelling, MD;  Location: [facility] INVASIVE CV LAB;  Service: Cardiovascular;  Laterality: Bilateral;  . PERIPHERAL VASCULAR THROMBECTOMY Left 05/08/2019   Procedure: PERIPHERAL VASCULAR THROMBECTOMY;  Surgeon: Maeola Harman, MD;  Location: [facility] INVASIVE CV LAB;  Service: Cardiovascular;  Laterality: Left;  posterior tibial  . TRANSMETATARSAL AMPUTATION Left 05/09/2019   Procedure: Left Below Knee Amputation;  Surgeon: Maeola Harman, MD;  Location: [facility] OR;  Service: Vascular;  Laterality: Left;       Family History  Problem Relation  Age of Onset  . Diabetes Father     Social History   Tobacco Use  . Smoking status: Current Every Day Smoker    Packs/day: 1.00    Types: Cigarettes  . Smokeless tobacco: Never Used  Substance Use Topics  . Alcohol use: Never  . Drug use:  Never    Home Medications Prior to Admission medications   Medication Sig Start Date End Date Taking? Authorizing Provider  Adalimumab (HUMIRA PEN) 40 MG/0.4ML PNKT Inject 40 mg into the skin every 14 (fourteen) days.    [provider]  amLODipine (NORVASC) 10 MG tablet Take 10 mg by mouth daily. 11/23/18   [provider]  aspirin EC 81 MG tablet Take 81 mg by mouth daily.    [provider]  bisacodyl (DULCOLAX) 5 MG EC tablet TAKE 1 TABLET BY MOUTH DAILY AS NEEDED FOR CONSTIPATION 10/10/19   [provider]  cephALEXin (KEFLEX) 500 MG capsule Take 1 capsule (500 mg total) by mouth 3 (three) times daily. 01/18/20   Baglia, Corrina, PA-C  clopidogrel (PLAVIX) 75 MG tablet Take 1 tablet (75 mg total) by mouth daily with breakfast. 04/30/19   Dagoberto Ligas, PA-C  docusate sodium (COLACE) 100 MG capsule Take 1 capsule (100 mg total) by mouth 2 (two) times daily. 05/16/19   Ulyses Amor, PA-C  DULoxetine (CYMBALTA) 30 MG capsule TAKE 1 CAPSULE BY MOUTH EVERY DAY 01/09/20   [provider]  gabapentin (NEURONTIN) 300 MG capsule Take 1 capsule (300 mg total) by mouth 3 (three) times daily. 05/16/19   Ulyses Amor, PA-C  HYDROcodone-acetaminophen (NORCO/VICODIN) 5-325 MG tablet Take 1 tablet by mouth every 6 (six) hours as needed for up to 2 days for severe pain. 01/24/20 01/26/20  Alveria Apley, PA-C  lisinopril-hydrochlorothiazide (ZESTORETIC) 20-12.5 MG tablet Take 2 tablets by mouth daily.  11/02/18   [provider]  pregabalin (LYRICA) 150 MG capsule Take 1 capsule (150 mg total) by mouth 2 (two) times daily. 07/05/19   Waynetta Sandy, MD  Rivaroxaban 15 & 20 MG TBPK Take as directed on package: Start with one 15mg  tablet by mouth twice a day with food. On Day 22, switch to one 20mg  tablet once a day with food. 04/29/19   Dagoberto Ligas, PA-C  sildenafil (REVATIO) 20 MG tablet Take 80 mg by mouth as needed (sexual activity).  05/04/19    [provider]  traMADol (ULTRAM) 50 MG tablet Take by mouth. 01/08/20 02/07/20  [provider]  triamcinolone ointment (KENALOG) 0.1 % Apply 1 application topically once a week.    [provider]    Allergies    Patient has no known allergies.  Review of Systems   Review of Systems  Constitutional: Negative for chills and fever.  Musculoskeletal: Positive for arthralgias.  Skin: Negative for color change, rash and wound.  Allergic/Immunologic: Negative for immunocompromised state.  Neurological: Negative for dizziness.  All other systems reviewed and are negative.   Physical Exam Updated Vital Signs BP (!) 122/92   Pulse 75   Temp 98.4 F (36.9 C) (Oral)   Resp 20   SpO2 99%   Physical Exam Vitals and nursing note reviewed.  Constitutional:      Appearance: Normal appearance.     Comments: Patient is sleeping comfortably but easily awakened when I enter the room   HENT:     Head: Normocephalic.     Mouth/Throat:     Mouth: Mucous membranes are  moist.  Eyes:     Conjunctiva/sclera: Conjunctivae normal.  Pulmonary:     Effort: Pulmonary effort is normal.  Skin:    General: Skin is dry.     Comments: BKA stump with well healed surgical scar. No erythema, warmth, drainage or palpables abscess. The distal portion is moderately tender and cool. Popliteal pulse found with doppler. Good skin color.   Neurological:     Mental Status: He is alert.  Psychiatric:        Mood and Affect: Mood normal.     ED Results / Procedures / Treatments   Labs (all labs ordered are listed, but only abnormal results are displayed) Labs Reviewed  CBC WITH DIFFERENTIAL/PLATELET - Abnormal; Notable for the following components:      Result Value   RBC 6.21 (*)    MCV 79.5 (*)    MCH 25.8 (*)    All other components within normal limits  BASIC METABOLIC PANEL - Abnormal; Notable for the following components:   Glucose, Bld 110 (*)    All other components  within normal limits    EKG None  Radiology No results found.  Procedures Procedures (including critical care time)  Medications Ordered in ED Medications  HYDROcodone-acetaminophen (NORCO/VICODIN) 5-325 MG per tablet 1 tablet (1 tablet Oral Given 01/24/20 8850)    ED Course  I have reviewed the triage vital signs and the nursing notes.  Pertinent labs & imaging results that were available during my care of the patient were reviewed by me and considered in my medical decision making (see chart for details).  Clinical Course as of Jan 24 719  Wed Jan 24, 2020  0712 Patient presenting with increase stump pain for a few weeks. Seeing vascular for the same. Recent xray without signs of osteomyelitis. Appears well on exam.  Physical exam is not concerning for cellulitis or an acute ischemia at this time.  Discussed with Dr. Jeraldine Loots.  Will increase patient's pain medicine and he will see vascular tomorrow.   [KM]    Clinical Course User Index [KM] Jeral Pinch   MDM Rules/Calculators/A&P                      Based on review of vitals, medical screening exam, lab work and/or imaging, there does not appear to be an acute, emergent etiology for the patient's symptoms. Counseled pt on good return precautions and encouraged both PCP and ED follow-up as needed.  Prior to discharge, I also discussed incidental imaging findings with patient in detail and advised appropriate, recommended follow-up in detail.  Clinical Impression: 1. Stump pain (HCC)     Disposition: Discharge  Prior to providing a prescription for a controlled substance, I independently reviewed the patient's recent prescription history on the [place] Controlled Substance Reporting System. The patient had no recent or regular prescriptions and was deemed appropriate for a brief, less than 3 day prescription of narcotic for acute analgesia.  This note was prepared with assistance of Software engineer. Occasional wrong-word or sound-a-like substitutions may have occurred due to the inherent limitations of voice recognition software.  Final Clinical Impression(s) / ED Diagnoses Final diagnoses:  Stump pain (HCC)    Rx / DC Orders ED Discharge Orders         Ordered    HYDROcodone-acetaminophen (NORCO/VICODIN) 5-325 MG tablet  Every 6 hours PRN     01/24/20 0716  Jeral Pinch 01/24/20 0720    Gerhard Munch, MD 01/27/20 [phone]

## 2020-01-24 NOTE — ED Notes (Signed)
Patient verbalizes understanding of discharge instructions . Opportunity for questions and answers were provided . Armband removed by staff ,Pt discharged from ED. W/C  offered at D/C  and Declined W/C at D/C and was escorted to lobby by RN.  

## 2020-01-24 NOTE — Discharge Instructions (Signed)
You are seen today for increased arm pain.  Your lab work was reassuring.  It is important that you follow-up with vascular surgery tomorrow as you are scheduled.  We have changed her pain medication for today and tomorrow.  Please discuss this with vascular surgery as well.  Do not take tramadol with hydrocodone. Thank you for allowing me to care for you today. Please return to the emergency department if you have new or worsening symptoms. Take your medications as instructed.

## 2020-01-24 NOTE — ED Triage Notes (Signed)
BIB [facility] from home. Reports pain to L BKA stump, present X several days. Was seen 01/18/20 for same at PCP and told to follow up in 1 week. Concern for osteomyelitis vs ischemia of stump. Given Keflex and xray was ordered showing soft tissue swelling, no bone abnormality.

## 2020-01-24 NOTE — ED Notes (Signed)
Verbal order for CBC w diff and Bmet from Dr Elesa Massed

## 2020-01-25 ENCOUNTER — Ambulatory Visit (HOSPITAL_COMMUNITY)
Admission: RE | Admit: 2020-01-25 | Discharge: 2020-01-25 | Disposition: A | Discharge status: Home / Self Care | Payer: BLUE CROSS/BLUE SHIELD | Source: Ambulatory Visit | Attending: Vascular Surgery | Admitting: Vascular Surgery

## 2020-01-25 ENCOUNTER — Ambulatory Visit (INDEPENDENT_AMBULATORY_CARE_PROVIDER_SITE_OTHER): Payer: BLUE CROSS/BLUE SHIELD | Admitting: Physician Assistant

## 2020-01-25 ENCOUNTER — Other Ambulatory Visit: Payer: Self-pay

## 2020-01-25 VITALS — BP 126/90 | HR 94 | Temp 98.8°F | Resp 20 | Ht 77.0 in | Wt 298.0 lb

## 2020-01-25 DIAGNOSIS — M79605 Pain in left leg: Secondary | ICD-10-CM | POA: Insufficient documentation

## 2020-01-25 DIAGNOSIS — Z95828 Presence of other vascular implants and grafts: Secondary | ICD-10-CM

## 2020-01-25 DIAGNOSIS — I739 Peripheral vascular disease, unspecified: Secondary | ICD-10-CM

## 2020-01-25 MED ORDER — HYDROCODONE-ACETAMINOPHEN 5-325 MG PO TABS: 1.0000 | ORAL_TABLET | Freq: Four times a day (QID) | ORAL | 0 refills | Status: DC | PRN

## 2020-01-25 NOTE — Progress Notes (Signed)
Office Note     CC:  follow up Requesting Provider:  No ref. provider found  HPI: Sean [name] is a 49 y.o. (1970/11/13) male who presents for 1 week follow up of left BKA stump pain. I last saw him on 01/18/20 and started him on Keflex and ordered a x-ray of left BKA stump with concerns of osteomyelitis vs ischemia of his stump. The x ray showed some soft tissue swelling but no evidence of bony erosion or abnormalities concerning for osteo. He reports that the swelling in the left BKA did improve with keflex however on completion of the 1 week course the swelling resumed. Yesterday 01/24/20 his pain was so intolerable and he was unable to sleep that he went to [facility] ED. They examined him and felt that there were no signs of cellulitis. They reviewed his xray findings and his lab values were unremarkable. He was given prescription for 3 day course of hydrocodone-Acetaminophen 5-325 for pain and instructed to keep today's appointment   Today he explains that for the past two days he has not been able to sleep. The pain is becoming more unbearable in the left BKA site. Aside from some improvement in the swelling with the keflex he feels that it has not improved over past week since he was last seen.  He has been taking the hydrocodone given to him at the ER which is the only thing that has given him some relief. He denies any fever or chills.  On review of patients chart I noted patients pain management note which reported placement of a peripheral nerve stimulator in left saphenous and left popliteal nerves on 12/15/19 for phantom limb pain. This was after not having improvement with left saphenous and left popliteal nerve injection with anesthetic and steroid on 09/29/19. When I discussed this with patient he reported that he got very little improvement with the injection and the nerve stimulator fell out several days after placement and it was determined not to replaced  The pt not on a statin for cholesterol  management.  The pt is on a daily aspirin.   Other AC: Plavix The pt is on CCB and Zestoretic for hypertension.   The pt is diabetic.  Tobacco hx: current smoker  Past Medical History:  Diagnosis Date  . Hypertension   . PVD (peripheral vascular disease) (HCC)     Past Surgical History:  Procedure Laterality Date  . ABDOMINAL AORTOGRAM W/LOWER EXTREMITY N/A 05/08/2019   Procedure: ABDOMINAL AORTOGRAM W/LOWER EXTREMITY;  Surgeon: Maeola Harman, MD;  Location: [facility] INVASIVE CV LAB;  Service: Cardiovascular;  Laterality: N/A;  . ANGIOPLASTY Left 04/27/2019   Procedure: Balloon Angioplasty of left posterior tibial artery;  Surgeon: Maeola Harman, MD;  Location: [facility] OR;  Service: Vascular;  Laterality: Left;  . EMBOLECTOMY Left 09/21/2018   Procedure: THROMBECTOMY LEFT POPLITEAL ARTERY BYPASS LEFT POPLITEAL TO TIBIAL ARTERY WITH SAPHENOUS VEIN HARVEST - FASCIOTOMY;  Surgeon: Chuck Hint, MD;  Location: [facility] OR;  Service: Vascular;  Laterality: Left;  . LOWER EXTREMITY ANGIOGRAM Left 04/27/2019   Procedure: LEFT LOWER EXTREMITY ANGIOGRAM;  Surgeon: Maeola Harman, MD;  Location: [facility] OR;  Service: Vascular;  Laterality: Left;  . PERIPHERAL VASCULAR BALLOON ANGIOPLASTY Left 05/08/2019   Procedure: PERIPHERAL VASCULAR BALLOON ANGIOPLASTY;  Surgeon: Maeola Harman, MD;  Location: [facility] INVASIVE CV LAB;  Service: Cardiovascular;  Laterality: Left;  Posterior tibial  . PERIPHERAL VASCULAR INTERVENTION Left 04/28/2019   Procedure: PERIPHERAL VASCULAR INTERVENTION;  Surgeon:  Cephus Shelling, MD;  Location: [facility] INVASIVE CV LAB;  Service: Cardiovascular;  Laterality: Left;  . PERIPHERAL VASCULAR THROMBECTOMY Bilateral 04/28/2019   Procedure: PERIPHERAL VASCULAR THROMBECTOMY;  Surgeon: Cephus Shelling, MD;  Location: [facility] INVASIVE CV LAB;  Service: Cardiovascular;  Laterality: Bilateral;  . PERIPHERAL VASCULAR THROMBECTOMY Left 05/08/2019   Procedure: PERIPHERAL  VASCULAR THROMBECTOMY;  Surgeon: Maeola Harman, MD;  Location: [facility] INVASIVE CV LAB;  Service: Cardiovascular;  Laterality: Left;  posterior tibial  . TRANSMETATARSAL AMPUTATION Left 05/09/2019   Procedure: Left Below Knee Amputation;  Surgeon: Maeola Harman, MD;  Location: [facility] OR;  Service: Vascular;  Laterality: Left;    Social History   Socioeconomic History  . Marital status: Married    Spouse name: Not on file  . Number of children: Not on file  . Years of education: Not on file  . Highest education level: Not on file  Occupational History  . Not on file  Tobacco Use  . Smoking status: Current Every Day Smoker    Packs/day: 1.00    Types: Cigarettes  . Smokeless tobacco: Never Used  Substance and Sexual Activity  . Alcohol use: Never  . Drug use: Never  . Sexual activity: Not on file  Other Topics Concern  . Not on file  Social History Narrative  . Not on file   Social Determinants of Health   Financial Resource Strain:   . Difficulty of Paying Living Expenses:   Food Insecurity:   . Worried About Programme researcher, broadcasting/film/video in the Last Year:   . Barista in the Last Year:   Transportation Needs:   . Freight forwarder (Medical):   Marland Kitchen Lack of Transportation (Non-Medical):   Physical Activity:   . Days of Exercise per Week:   . Minutes of Exercise per Session:   Stress:   . Feeling of Stress :   Social Connections:   . Frequency of Communication with Friends and Family:   . Frequency of Social Gatherings with Friends and Family:   . Attends Religious Services:   . Active Member of Clubs or Organizations:   . Attends Banker Meetings:   Marland Kitchen Marital Status:   Intimate Partner Violence:   . Fear of Current or Ex-Partner:   . Emotionally Abused:   Marland Kitchen Physically Abused:   . Sexually Abused:     Family History  Problem Relation Age of Onset  . Diabetes Father     Current Outpatient Medications  Medication Sig Dispense  Refill  . Adalimumab (HUMIRA PEN) 40 MG/0.4ML PNKT Inject 40 mg into the skin every 14 (fourteen) days.    Marland Kitchen amLODipine (NORVASC) 10 MG tablet Take 10 mg by mouth daily.    Marland Kitchen aspirin EC 81 MG tablet Take 81 mg by mouth daily.    . bisacodyl (DULCOLAX) 5 MG EC tablet TAKE 1 TABLET BY MOUTH DAILY AS NEEDED FOR CONSTIPATION    . cephALEXin (KEFLEX) 500 MG capsule Take 1 capsule (500 mg total) by mouth 3 (three) times daily. 21 capsule 0  . clopidogrel (PLAVIX) 75 MG tablet Take 1 tablet (75 mg total) by mouth daily with breakfast. 30 tablet 2  . docusate sodium (COLACE) 100 MG capsule Take 1 capsule (100 mg total) by mouth 2 (two) times daily. 10 capsule 0  . DULoxetine (CYMBALTA) 30 MG capsule TAKE 1 CAPSULE BY MOUTH EVERY DAY    . gabapentin (NEURONTIN) 300 MG capsule Take 1 capsule (300  mg total) by mouth 3 (three) times daily. 90 capsule 0  . HYDROcodone-acetaminophen (NORCO/VICODIN) 5-325 MG tablet Take 1 tablet by mouth every 6 (six) hours as needed for up to 7 days for severe pain. 20 tablet 0  . lisinopril-hydrochlorothiazide (ZESTORETIC) 20-12.5 MG tablet Take 2 tablets by mouth daily.     . Rivaroxaban 15 & 20 MG TBPK Take as directed on package: Start with one 15mg  tablet by mouth twice a day with food. On Day 22, switch to one 20mg  tablet once a day with food. 51 each 0  . sildenafil (REVATIO) 20 MG tablet Take 80 mg by mouth as needed (sexual activity).     . traMADol (ULTRAM) 50 MG tablet Take by mouth.    . triamcinolone ointment (KENALOG) 0.1 % Apply 1 application topically once a week.     No current facility-administered medications for this visit.    No Known Allergies  Review of symptoms negative unless documented in HPI  PHYSICAL EXAMINATION:  Vitals:   01/25/20 1421  BP: 126/90  Pulse: 94  Resp: 20  Temp: 98.8 F (37.1 C)  TempSrc: Oral  SpO2: 98%  Weight: 298 lb (135.2 kg)  Height: 6\' 5"  (1.956 m)   General:  WDWN in NAD; vital signs documented above Gait:  not observed, wheel chair HENT: WNL, normocephalic Pulmonary: normal non-labored breathing , without Rales, rhonchi,  wheezing Cardiac: regular HR, without  Murmurs without carotid bruit Vascular Exam/Pulses:  Right Left  Radial 2+ (normal) 2+ (normal)  Femoral 2+ (normal) 2+ (normal)  Popliteal Not palpable Not palpable  DP 1+ (weak) amputation  PT Not palpable amputation   Extremities: with ischemic changes of the left BKA stump. Cool to touch. Tender. Swelling of distal BKA stump. Otherwise well healed. No fluctuance or signs of infection.Left BKA is without Gangrene , without cellulitis; without open wounds Musculoskeletal: no muscle wasting or atrophy  Neurologic: A&O X 3;  No focal weakness or paresthesias are detected Psychiatric:  The pt has Normal affect.   Non-Invasive Vascular Imaging:    +----------+--------+-----+--------+---------+----------+  LEFT   PSV cm/sRatioStenosisWaveform Comments   +----------+--------+-----+--------+---------+----------+  CFA Distal99          triphasic       +----------+--------+-----+--------+---------+----------+  DFA    43          triphasic       +----------+--------+-----+--------+---------+----------+  SFA Prox 33          triphasic       +----------+--------+-----+--------+---------+----------+  SFA Mid  21          triphasic       +----------+--------+-----+--------+---------+----------+  SFA Distal0           absent         +----------+--------+-----+--------+---------+----------+  POP Prox 0           absent         +----------+--------+-----+--------+---------+----------+  POP Mid  0           absent         +----------+--------+-----+--------+---------+----------+  ATA Prox                 amputation   +----------+--------+-----+--------+---------+----------+  PTA Prox                 amputation  +----------+--------+-----+--------+---------+----------+   A focal velocity elevation of 0 cm/s was obtained at Mid SFA with a VR of  0.0. Findings are characteristic of total occlusion. Limited venous  evaluation showed  patent and compressible left common femoral, superficial  femoral, and popliteal veins.     ASSESSMENT/PLAN:: 49 y.o. male here for follow up for left BKA site pain. This has been ongoing now for a couple weeks in its present severity. He was previously having some phantom limb pains but now with rest pain in the stump. X-rays showed no signs of osteomyelitis. There was some soft tissue swelling noted. 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 On duplex today the SFA occludes in the mid thigh with no distal reconstitution. I have discussed the findings with patient and his wife. I had a long discussion with them about next step being revision to an above knee amputation due to patient severe pain. I will discuss this with Dr. Randie Heinz who has done his previous amputation. Patient and his wife are willing to proceed with amputation. - Hydrocodone Acetaminophen Rx given to patient for pain control until scheduled surgery - Advised him to not take Tramadol if he is taking this medication - I will discuss the AKA with Dr. Randie Heinz and will contact patient if there are any changes in treatment plan - His surgery will be scheduled at next earliest availability - He will need to hold his Aspirin and Eliquis   Graceann Congress, PA-C [facility] [phone]  Clinic MD:  Dr. Darrick Penna

## 2020-01-26 ENCOUNTER — Other Ambulatory Visit: Payer: Self-pay

## 2020-01-27 ENCOUNTER — Other Ambulatory Visit (HOSPITAL_COMMUNITY)
Admission: RE | Admit: 2020-01-27 | Discharge: 2020-01-27 | Disposition: A | Discharge status: Home / Self Care | Payer: BLUE CROSS/BLUE SHIELD | Source: Ambulatory Visit | Attending: Vascular Surgery | Admitting: Vascular Surgery

## 2020-01-27 DIAGNOSIS — Z20822 Contact with and (suspected) exposure to covid-19: Secondary | ICD-10-CM | POA: Insufficient documentation

## 2020-01-27 DIAGNOSIS — Z01812 Encounter for preprocedural laboratory examination: Secondary | ICD-10-CM | POA: Insufficient documentation

## 2020-01-27 LAB — SARS CORONAVIRUS 2 (TAT 6-24 HRS): SARS Coronavirus 2: NEGATIVE

## 2020-01-29 ENCOUNTER — Encounter (HOSPITAL_COMMUNITY): Payer: Self-pay | Admitting: Vascular Surgery

## 2020-01-29 ENCOUNTER — Other Ambulatory Visit: Payer: Self-pay

## 2020-01-29 MED ORDER — DEXTROSE 5 % IV SOLN
3.0000 g | INTRAVENOUS | Status: DC
Start: 2020-01-30 — End: 2020-01-29

## 2020-01-29 MED ORDER — DEXTROSE 5 % IV SOLN
3.0000 g | Freq: Once | INTRAVENOUS | Status: AC
  Administered 2020-01-30: 3 g via INTRAVENOUS
  Filled 2020-01-29: qty 3

## 2020-01-29 NOTE — Progress Notes (Signed)
Spoke with pt for pre-op call. Pt denies cardiac disease or Diabetes. Pt is treated for HTN and PVD. Pt is on Plavix and he states his last dose was Friday 01/26/20.   Covid test done 01/27/20 and it's negative. Pt states he's been in quarantine since the test was done and understands that he needs to stay in quarantine until he comes to the hospital tomorrow.

## 2020-01-30 ENCOUNTER — Other Ambulatory Visit: Payer: Self-pay

## 2020-01-30 ENCOUNTER — Inpatient Hospital Stay (HOSPITAL_COMMUNITY): Payer: BLUE CROSS/BLUE SHIELD | Admitting: Anesthesiology

## 2020-01-30 ENCOUNTER — Encounter (HOSPITAL_COMMUNITY): Payer: Self-pay | Admitting: Vascular Surgery

## 2020-01-30 ENCOUNTER — Encounter (HOSPITAL_COMMUNITY)
Admission: RE | Disposition: A | Discharge status: Home / Self Care | Payer: Self-pay | Source: Home / Self Care | Attending: Vascular Surgery

## 2020-01-30 ENCOUNTER — Inpatient Hospital Stay (HOSPITAL_COMMUNITY)
Admission: RE | Admit: 2020-01-30 | Discharge: 2020-02-02 | DRG: 241 | Disposition: A | Discharge status: Home / Self Care | Payer: BLUE CROSS/BLUE SHIELD | Attending: Vascular Surgery | Admitting: Vascular Surgery

## 2020-01-30 DIAGNOSIS — Z7902 Long term (current) use of antithrombotics/antiplatelets: Secondary | ICD-10-CM

## 2020-01-30 DIAGNOSIS — I739 Peripheral vascular disease, unspecified: Secondary | ICD-10-CM | POA: Diagnosis present

## 2020-01-30 DIAGNOSIS — Z89512 Acquired absence of left leg below knee: Secondary | ICD-10-CM

## 2020-01-30 DIAGNOSIS — F17210 Nicotine dependence, cigarettes, uncomplicated: Secondary | ICD-10-CM | POA: Diagnosis present

## 2020-01-30 DIAGNOSIS — T8789 Other complications of amputation stump: Secondary | ICD-10-CM | POA: Diagnosis present

## 2020-01-30 DIAGNOSIS — Z20822 Contact with and (suspected) exposure to covid-19: Secondary | ICD-10-CM | POA: Diagnosis present

## 2020-01-30 DIAGNOSIS — Z7982 Long term (current) use of aspirin: Secondary | ICD-10-CM

## 2020-01-30 DIAGNOSIS — Z79891 Long term (current) use of opiate analgesic: Secondary | ICD-10-CM

## 2020-01-30 DIAGNOSIS — Z79899 Other long term (current) drug therapy: Secondary | ICD-10-CM | POA: Diagnosis not present

## 2020-01-30 DIAGNOSIS — Y835 Amputation of limb(s) as the cause of abnormal reaction of the patient, or of later complication, without mention of misadventure at the time of the procedure: Secondary | ICD-10-CM | POA: Diagnosis present

## 2020-01-30 DIAGNOSIS — E119 Type 2 diabetes mellitus without complications: Secondary | ICD-10-CM | POA: Diagnosis present

## 2020-01-30 DIAGNOSIS — I10 Essential (primary) hypertension: Secondary | ICD-10-CM | POA: Diagnosis present

## 2020-01-30 HISTORY — PX: AMPUTATION: SHX166

## 2020-01-30 LAB — CBC
HCT: 47.9 % (ref 39.0–52.0)
Hemoglobin: 15.5 g/dL (ref 13.0–17.0)
MCH: 25.8 pg — ABNORMAL LOW (ref 26.0–34.0)
MCHC: 32.4 g/dL (ref 30.0–36.0)
MCV: 79.8 fL — ABNORMAL LOW (ref 80.0–100.0)
Platelets: 359 10*3/uL (ref 150–400)
RBC: 6 MIL/uL — ABNORMAL HIGH (ref 4.22–5.81)
RDW: 15.3 % (ref 11.5–15.5)
WBC: 8.1 10*3/uL (ref 4.0–10.5)
nRBC: 0 % (ref 0.0–0.2)

## 2020-01-30 LAB — COMPREHENSIVE METABOLIC PANEL
ALT: 27 U/L (ref 0–44)
AST: 21 U/L (ref 15–41)
Albumin: 3.6 g/dL (ref 3.5–5.0)
Alkaline Phosphatase: 63 U/L (ref 38–126)
Anion gap: 8 (ref 5–15)
BUN: 10 mg/dL (ref 6–20)
CO2: 26 mmol/L (ref 22–32)
Calcium: 9.1 mg/dL (ref 8.9–10.3)
Chloride: 104 mmol/L (ref 98–111)
Creatinine, Ser: 0.83 mg/dL (ref 0.61–1.24)
GFR calc Af Amer: >60 mL/min (ref >60)
GFR calc non Af Amer: >60 mL/min (ref >60)
Glucose, Bld: 109 mg/dL — ABNORMAL HIGH (ref 70–99)
Potassium: 3.6 mmol/L (ref 3.5–5.1)
Sodium: 138 mmol/L (ref 135–145)
Total Bilirubin: 0.8 mg/dL (ref 0.3–1.2)
Total Protein: 6.5 g/dL (ref 6.5–8.1)

## 2020-01-30 LAB — PROTIME-INR
INR: 1 (ref 0.8–1.2)
Prothrombin Time: 12.7 seconds (ref 11.4–15.2)

## 2020-01-30 LAB — URINALYSIS, ROUTINE W REFLEX MICROSCOPIC
Bacteria, UA: NONE SEEN
Bilirubin Urine: NEGATIVE
Glucose, UA: NEGATIVE mg/dL
Ketones, ur: NEGATIVE mg/dL
Leukocytes,Ua: NEGATIVE
Nitrite: NEGATIVE
Protein, ur: NEGATIVE mg/dL
Specific Gravity, Urine: 1.009 (ref 1.005–1.030)
pH: 6 (ref 5.0–8.0)

## 2020-01-30 LAB — TYPE AND SCREEN
ABO/RH(D): O POS
Antibody Screen: NEGATIVE

## 2020-01-30 LAB — APTT: aPTT: 30 seconds (ref 24–36)

## 2020-01-30 SURGERY — AMPUTATION, ABOVE KNEE
Anesthesia: General | Site: Knee | Laterality: Left

## 2020-01-30 MED ORDER — ONDANSETRON HCL 4 MG/2ML IJ SOLN
INTRAMUSCULAR | Status: DC | PRN
  Administered 2020-01-30: 4 mg via INTRAVENOUS

## 2020-01-30 MED ORDER — SODIUM CHLORIDE 0.9 % IV SOLN: INTRAVENOUS | Status: DC

## 2020-01-30 MED ORDER — MIDAZOLAM HCL 2 MG/2ML IJ SOLN
INTRAMUSCULAR | Status: AC
  Filled 2020-01-30: qty 2

## 2020-01-30 MED ORDER — SENNOSIDES-DOCUSATE SODIUM 8.6-50 MG PO TABS: 1.0000 | ORAL_TABLET | Freq: Every evening | ORAL | Status: DC | PRN

## 2020-01-30 MED ORDER — POTASSIUM CHLORIDE CRYS ER 20 MEQ PO TBCR
20.0000 meq | EXTENDED_RELEASE_TABLET | Freq: Once | ORAL | Status: DC
  Filled 2020-01-30: qty 1

## 2020-01-30 MED ORDER — ORAL CARE MOUTH RINSE: 15.0000 mL | Freq: Once | OROMUCOSAL | Status: AC

## 2020-01-30 MED ORDER — HEPARIN SODIUM (PORCINE) 5000 UNIT/ML IJ SOLN
5000.0000 [IU] | Freq: Three times a day (TID) | INTRAMUSCULAR | Status: DC
  Administered 2020-01-31 – 2020-02-02 (×7): 5000 [IU] via SUBCUTANEOUS
  Filled 2020-01-30 (×7): qty 1

## 2020-01-30 MED ORDER — POTASSIUM CHLORIDE CRYS ER 20 MEQ PO TBCR
20.0000 meq | EXTENDED_RELEASE_TABLET | Freq: Every day | ORAL | Status: DC
  Administered 2020-01-30 – 2020-02-02 (×4): 20 meq via ORAL
  Filled 2020-01-30 (×3): qty 1

## 2020-01-30 MED ORDER — FENTANYL CITRATE (PF) 100 MCG/2ML IJ SOLN
25.0000 ug | INTRAMUSCULAR | Status: DC | PRN
  Administered 2020-01-30 (×2): 50 ug via INTRAVENOUS

## 2020-01-30 MED ORDER — ONDANSETRON HCL 4 MG/2ML IJ SOLN
INTRAMUSCULAR | Status: AC
  Filled 2020-01-30: qty 2

## 2020-01-30 MED ORDER — FENTANYL CITRATE (PF) 250 MCG/5ML IJ SOLN
INTRAMUSCULAR | Status: AC
  Filled 2020-01-30: qty 5

## 2020-01-30 MED ORDER — HYDROCHLOROTHIAZIDE 25 MG PO TABS
25.0000 mg | ORAL_TABLET | Freq: Every day | ORAL | Status: DC
  Administered 2020-01-30 – 2020-02-02 (×4): 25 mg via ORAL
  Filled 2020-01-30 (×4): qty 1

## 2020-01-30 MED ORDER — CHLORHEXIDINE GLUCONATE CLOTH 2 % EX PADS: 6.0000 | MEDICATED_PAD | Freq: Once | CUTANEOUS | Status: DC

## 2020-01-30 MED ORDER — METOPROLOL TARTRATE 5 MG/5ML IV SOLN: 2.0000 mg | INTRAVENOUS | Status: DC | PRN

## 2020-01-30 MED ORDER — PREGABALIN 100 MG PO CAPS
100.0000 mg | ORAL_CAPSULE | Freq: Two times a day (BID) | ORAL | Status: DC
  Administered 2020-01-30 – 2020-02-02 (×6): 100 mg via ORAL
  Filled 2020-01-30 (×6): qty 1

## 2020-01-30 MED ORDER — PHENOL 1.4 % MT LIQD: 1.0000 | OROMUCOSAL | Status: DC | PRN

## 2020-01-30 MED ORDER — GABAPENTIN 300 MG PO CAPS
300.0000 mg | ORAL_CAPSULE | Freq: Once | ORAL | Status: AC
  Administered 2020-01-30: 300 mg via ORAL
  Filled 2020-01-30: qty 1

## 2020-01-30 MED ORDER — BISACODYL 5 MG PO TBEC
5.0000 mg | DELAYED_RELEASE_TABLET | Freq: Every day | ORAL | Status: DC | PRN
  Administered 2020-02-01: 5 mg via ORAL
  Filled 2020-01-30: qty 1

## 2020-01-30 MED ORDER — MIDAZOLAM HCL 5 MG/5ML IJ SOLN
INTRAMUSCULAR | Status: DC | PRN
  Administered 2020-01-30: 2 mg via INTRAVENOUS

## 2020-01-30 MED ORDER — CELECOXIB 200 MG PO CAPS
200.0000 mg | ORAL_CAPSULE | Freq: Once | ORAL | Status: AC
  Administered 2020-01-30: 200 mg via ORAL
  Filled 2020-01-30: qty 1

## 2020-01-30 MED ORDER — PANTOPRAZOLE SODIUM 40 MG PO TBEC
40.0000 mg | DELAYED_RELEASE_TABLET | Freq: Every day | ORAL | Status: DC
  Administered 2020-01-30 – 2020-02-02 (×4): 40 mg via ORAL
  Filled 2020-01-30 (×4): qty 1

## 2020-01-30 MED ORDER — KETAMINE HCL 50 MG/5ML IJ SOSY
PREFILLED_SYRINGE | INTRAMUSCULAR | Status: AC
  Filled 2020-01-30: qty 5

## 2020-01-30 MED ORDER — ONDANSETRON HCL 4 MG/2ML IJ SOLN: 4.0000 mg | Freq: Four times a day (QID) | INTRAMUSCULAR | Status: DC | PRN

## 2020-01-30 MED ORDER — PHENYLEPHRINE HCL (PRESSORS) 10 MG/ML IV SOLN
INTRAVENOUS | Status: DC | PRN
  Administered 2020-01-30: 120 ug via INTRAVENOUS
  Administered 2020-01-30: 80 ug via INTRAVENOUS
  Administered 2020-01-30 (×2): 120 ug via INTRAVENOUS

## 2020-01-30 MED ORDER — ASPIRIN EC 81 MG PO TBEC
81.0000 mg | DELAYED_RELEASE_TABLET | Freq: Every day | ORAL | Status: DC
  Administered 2020-01-31 – 2020-02-02 (×3): 81 mg via ORAL
  Filled 2020-01-30 (×3): qty 1

## 2020-01-30 MED ORDER — PROMETHAZINE HCL 25 MG/ML IJ SOLN: 6.2500 mg | INTRAMUSCULAR | Status: DC | PRN

## 2020-01-30 MED ORDER — PROPOFOL 10 MG/ML IV BOLUS
INTRAVENOUS | Status: DC | PRN
  Administered 2020-01-30: 150 mg via INTRAVENOUS

## 2020-01-30 MED ORDER — SUGAMMADEX SODIUM 200 MG/2ML IV SOLN
INTRAVENOUS | Status: DC | PRN
  Administered 2020-01-30: 400 mg via INTRAVENOUS

## 2020-01-30 MED ORDER — ACETAMINOPHEN 500 MG PO TABS
1000.0000 mg | ORAL_TABLET | Freq: Once | ORAL | Status: AC
  Administered 2020-01-30: 1000 mg via ORAL
  Filled 2020-01-30: qty 2

## 2020-01-30 MED ORDER — CEFAZOLIN SODIUM-DEXTROSE 2-4 GM/100ML-% IV SOLN
2.0000 g | Freq: Three times a day (TID) | INTRAVENOUS | Status: DC
  Administered 2020-01-30 – 2020-02-01 (×6): 2 g via INTRAVENOUS
  Filled 2020-01-30 (×6): qty 100

## 2020-01-30 MED ORDER — GUAIFENESIN-DM 100-10 MG/5ML PO SYRP: 15.0000 mL | ORAL_SOLUTION | ORAL | Status: DC | PRN

## 2020-01-30 MED ORDER — FENTANYL CITRATE (PF) 100 MCG/2ML IJ SOLN
INTRAMUSCULAR | Status: AC
  Filled 2020-01-30: qty 2

## 2020-01-30 MED ORDER — LISINOPRIL 40 MG PO TABS
40.0000 mg | ORAL_TABLET | Freq: Every day | ORAL | Status: DC
  Administered 2020-01-30 – 2020-02-02 (×4): 40 mg via ORAL
  Filled 2020-01-30 (×4): qty 1

## 2020-01-30 MED ORDER — HYDRALAZINE HCL 20 MG/ML IJ SOLN: 5.0000 mg | INTRAMUSCULAR | Status: DC | PRN

## 2020-01-30 MED ORDER — ACETAMINOPHEN 650 MG RE SUPP: 325.0000 mg | RECTAL | Status: DC | PRN

## 2020-01-30 MED ORDER — DOCUSATE SODIUM 100 MG PO CAPS
100.0000 mg | ORAL_CAPSULE | Freq: Two times a day (BID) | ORAL | Status: DC
  Administered 2020-01-30 – 2020-02-02 (×7): 100 mg via ORAL
  Filled 2020-01-30 (×7): qty 1

## 2020-01-30 MED ORDER — DEXMEDETOMIDINE HCL 200 MCG/2ML IV SOLN
INTRAVENOUS | Status: DC | PRN
  Administered 2020-01-30: 12 ug via INTRAVENOUS
  Administered 2020-01-30: 8 ug via INTRAVENOUS
  Administered 2020-01-30: 12 ug via INTRAVENOUS

## 2020-01-30 MED ORDER — LABETALOL HCL 5 MG/ML IV SOLN: 10.0000 mg | INTRAVENOUS | Status: DC | PRN

## 2020-01-30 MED ORDER — CLOPIDOGREL BISULFATE 75 MG PO TABS
75.0000 mg | ORAL_TABLET | Freq: Every day | ORAL | Status: DC
  Administered 2020-01-31 – 2020-02-02 (×3): 75 mg via ORAL
  Filled 2020-01-30 (×3): qty 1

## 2020-01-30 MED ORDER — KETAMINE HCL 10 MG/ML IJ SOLN
INTRAMUSCULAR | Status: DC | PRN
  Administered 2020-01-30: 30 mg via INTRAVENOUS

## 2020-01-30 MED ORDER — LIDOCAINE 2% (20 MG/ML) 5 ML SYRINGE
INTRAMUSCULAR | Status: AC
  Filled 2020-01-30: qty 5

## 2020-01-30 MED ORDER — LACTATED RINGERS IV SOLN: INTRAVENOUS | Status: DC

## 2020-01-30 MED ORDER — LISINOPRIL-HYDROCHLOROTHIAZIDE 20-12.5 MG PO TABS: 2.0000 | ORAL_TABLET | Freq: Every day | ORAL | Status: DC

## 2020-01-30 MED ORDER — ROCURONIUM BROMIDE 100 MG/10ML IV SOLN
INTRAVENOUS | Status: DC | PRN
  Administered 2020-01-30: 60 mg via INTRAVENOUS

## 2020-01-30 MED ORDER — FENTANYL CITRATE (PF) 100 MCG/2ML IJ SOLN
INTRAMUSCULAR | Status: DC | PRN
  Administered 2020-01-30 (×2): 50 ug via INTRAVENOUS
  Administered 2020-01-30: 100 ug via INTRAVENOUS
  Administered 2020-01-30: 50 ug via INTRAVENOUS

## 2020-01-30 MED ORDER — PROPOFOL 10 MG/ML IV BOLUS
INTRAVENOUS | Status: AC
  Filled 2020-01-30: qty 20

## 2020-01-30 MED ORDER — ALUM & MAG HYDROXIDE-SIMETH 200-200-20 MG/5ML PO SUSP: 15.0000 mL | ORAL | Status: DC | PRN

## 2020-01-30 MED ORDER — HYDROCODONE-ACETAMINOPHEN 5-325 MG PO TABS
1.0000 | ORAL_TABLET | ORAL | Status: DC | PRN
  Administered 2020-01-30 – 2020-02-02 (×8): 2 via ORAL
  Filled 2020-01-30 (×8): qty 2

## 2020-01-30 MED ORDER — DULOXETINE HCL 30 MG PO CPEP
30.0000 mg | ORAL_CAPSULE | Freq: Every day | ORAL | Status: DC
  Administered 2020-01-31 – 2020-02-02 (×3): 30 mg via ORAL
  Filled 2020-01-30 (×3): qty 1

## 2020-01-30 MED ORDER — BACITRACIN ZINC 500 UNIT/GM EX OINT
TOPICAL_OINTMENT | CUTANEOUS | Status: AC
  Filled 2020-01-30: qty 28.35

## 2020-01-30 MED ORDER — ACETAMINOPHEN 325 MG PO TABS: 325.0000 mg | ORAL_TABLET | ORAL | Status: DC | PRN

## 2020-01-30 MED ORDER — HYDROMORPHONE HCL 1 MG/ML IJ SOLN
0.5000 mg | INTRAMUSCULAR | Status: DC | PRN
  Administered 2020-01-30 – 2020-02-01 (×2): 1 mg via INTRAVENOUS
  Filled 2020-01-30 (×2): qty 1

## 2020-01-30 MED ORDER — AMLODIPINE BESYLATE 10 MG PO TABS
10.0000 mg | ORAL_TABLET | Freq: Every day | ORAL | Status: DC
  Administered 2020-01-31 – 2020-02-02 (×3): 10 mg via ORAL
  Filled 2020-01-30 (×3): qty 1

## 2020-01-30 MED ORDER — BACITRACIN ZINC 500 UNIT/GM EX OINT
TOPICAL_OINTMENT | CUTANEOUS | Status: DC | PRN
  Administered 2020-01-30: 1 via TOPICAL

## 2020-01-30 MED ORDER — 0.9 % SODIUM CHLORIDE (POUR BTL) OPTIME
TOPICAL | Status: DC | PRN
  Administered 2020-01-30: 1000 mL

## 2020-01-30 MED ORDER — CHLORHEXIDINE GLUCONATE 0.12 % MT SOLN
15.0000 mL | Freq: Once | OROMUCOSAL | Status: AC
  Administered 2020-01-30: 15 mL via OROMUCOSAL
  Filled 2020-01-30: qty 15

## 2020-01-30 MED ORDER — LIDOCAINE HCL (CARDIAC) PF 100 MG/5ML IV SOSY
PREFILLED_SYRINGE | INTRAVENOUS | Status: DC | PRN
  Administered 2020-01-30: 60 mg via INTRAVENOUS

## 2020-01-30 SURGICAL SUPPLY — 53 items
BANDAGE ESMARK 6X9 LF (GAUZE/BANDAGES/DRESSINGS) ×1 IMPLANT
BLADE SAGITTAL (BLADE)
BLADE SAGITTAL 25.0X1.19X90 (BLADE) IMPLANT
BLADE SAW GIGLI 510 (BLADE) IMPLANT
BLADE SAW RECIP 87.9 MT (BLADE) ×2 IMPLANT
BLADE SAW THK.89X75X18XSGTL (BLADE) IMPLANT
BNDG COHESIVE 6X5 TAN STRL LF (GAUZE/BANDAGES/DRESSINGS) ×2 IMPLANT
BNDG ELASTIC 4X5.8 VLCR STR LF (GAUZE/BANDAGES/DRESSINGS) ×2 IMPLANT
BNDG ELASTIC 6X5.8 VLCR STR LF (GAUZE/BANDAGES/DRESSINGS) IMPLANT
BNDG ESMARK 6X9 LF (GAUZE/BANDAGES/DRESSINGS) ×2
BNDG GAUZE ELAST 4 BULKY (GAUZE/BANDAGES/DRESSINGS) ×2 IMPLANT
CANISTER SUCT 3000ML PPV (MISCELLANEOUS) ×2 IMPLANT
CLIP VESOCCLUDE MED 6/CT (CLIP) IMPLANT
COVER SURGICAL LIGHT HANDLE (MISCELLANEOUS) ×2 IMPLANT
COVER WAND RF STERILE (DRAPES) IMPLANT
CUFF TOURN SGL QUICK 24 (TOURNIQUET CUFF)
CUFF TOURN SGL QUICK 34 (TOURNIQUET CUFF) ×1
CUFF TRNQT CYL 24X4X16.5-23 (TOURNIQUET CUFF) IMPLANT
CUFF TRNQT CYL 34X4.125X (TOURNIQUET CUFF) ×1 IMPLANT
DRAIN CHANNEL 19F RND (DRAIN) IMPLANT
DRAPE HALF SHEET 40X57 (DRAPES) ×2 IMPLANT
DRAPE ORTHO SPLIT 77X108 STRL (DRAPES) ×2
DRAPE SURG ORHT 6 SPLT 77X108 (DRAPES) ×2 IMPLANT
DRSG ADAPTIC 3X8 NADH LF (GAUZE/BANDAGES/DRESSINGS) ×2 IMPLANT
ELECT CAUTERY BLADE 6.4 (BLADE) ×2 IMPLANT
ELECT REM PT RETURN 9FT ADLT (ELECTROSURGICAL) ×2
ELECTRODE REM PT RTRN 9FT ADLT (ELECTROSURGICAL) ×1 IMPLANT
EVACUATOR SILICONE 100CC (DRAIN) IMPLANT
GAUZE SPONGE 4X4 12PLY STRL (GAUZE/BANDAGES/DRESSINGS) ×2 IMPLANT
GLOVE BIO SURGEON STRL SZ7.5 (GLOVE) ×2 IMPLANT
GOWN STRL REUS W/ TWL LRG LVL3 (GOWN DISPOSABLE) ×2 IMPLANT
GOWN STRL REUS W/ TWL XL LVL3 (GOWN DISPOSABLE) ×1 IMPLANT
GOWN STRL REUS W/TWL LRG LVL3 (GOWN DISPOSABLE) ×2
GOWN STRL REUS W/TWL XL LVL3 (GOWN DISPOSABLE) ×1
KIT BASIN OR (CUSTOM PROCEDURE TRAY) ×2 IMPLANT
KIT TURNOVER KIT B (KITS) ×2 IMPLANT
NS IRRIG 1000ML POUR BTL (IV SOLUTION) ×2 IMPLANT
PACK GENERAL/GYN (CUSTOM PROCEDURE TRAY) ×2 IMPLANT
PAD ARMBOARD 7.5X6 YLW CONV (MISCELLANEOUS) ×4 IMPLANT
STAPLER VISISTAT 35W (STAPLE) ×2 IMPLANT
STOCKINETTE IMPERVIOUS LG (DRAPES) ×2 IMPLANT
SUT ETHILON 3 0 PS 1 (SUTURE) ×2 IMPLANT
SUT SILK 0 TIES 10X30 (SUTURE) ×2 IMPLANT
SUT SILK 2 0 (SUTURE) ×1
SUT SILK 2-0 18XBRD TIE 12 (SUTURE) ×1 IMPLANT
SUT SILK 3 0 (SUTURE)
SUT SILK 3-0 18XBRD TIE 12 (SUTURE) IMPLANT
SUT VIC AB 2-0 CT1 18 (SUTURE) ×2 IMPLANT
SUT VIC AB 2-0 CT1 27 (SUTURE) ×1
SUT VIC AB 2-0 CT1 TAPERPNT 27 (SUTURE) ×1 IMPLANT
TOWEL GREEN STERILE (TOWEL DISPOSABLE) ×6 IMPLANT
UNDERPAD 30X36 HEAVY ABSORB (UNDERPADS AND DIAPERS) ×2 IMPLANT
WATER STERILE IRR 1000ML POUR (IV SOLUTION) ×2 IMPLANT

## 2020-01-30 NOTE — H&P (Signed)
   History and Physical Update  The patient was interviewed and re-examined.  The patient's previous History and Physical has been reviewed and is unchanged from recent office visit. Plan for conversion to right aka.   Liliana Dang C. Randie Heinz, MD [facility] [place] Office: [phone] Pager: [phone]   01/30/2020, 7:32 AM

## 2020-01-30 NOTE — Op Note (Signed)
    NAME: Sean [name]    MRN: [id number] DOB: 1971-01-10    DATE OF OPERATION: 01/30/2020  PREOP DIAGNOSIS:    Rest pain left BKA  POSTOP DIAGNOSIS:    Same  PROCEDURE:    Left above-the-knee amputation  SURGEON: Di Kindle. Edilia Bo, MD  ASSIST: Clinton Gallant, PA  ANESTHESIA: General  EBL: Minimal  INDICATIONS:    [name] is a 49 y.o. male who underwent a left below the knee amputation.  He was having significant rest pain and presents now for revision to an AKA.  FINDINGS:   The muscle appeared well perfused at this level.  TECHNIQUE:   The patient was taken to the operating room and received a general anesthetic the left lower extremity was prepped and draped in usual sterile fashion.  I marked a fishmouth incision approximately 10 cm above the patella.  Tourniquet was placed on the upper thigh.  The leg was exsanguinated with an Esmarch bandage.  Tourniquet was inflated to 300 mmHg.  Under tourniquet control the incision was carried down to the skin, subcutaneous tissue, fascia and muscle to the femur which was dissected free circumferentially.  The periosteum was elevated and the bone divided proximal to lateral skin division.  The femoral artery and vein were individually suture ligated with 2-0 silk ties.  The tourniquet was then released.  Additional hemostasis was obtained hemostasis was obtained in the knee.  The wound was irrigated.  The wound was closed with interrupted 2-0 Vicryl's for the fascia and the skin was closed with staples.  A sterile dressing was applied.  The patient tolerated procedure well was transferred recovery room in stable condition.  All needle sponge counts were correct.  Waverly Ferrari, MD, FACS [facility]  DATE OF DICTATION:   01/30/2020

## 2020-01-30 NOTE — Transfer of Care (Signed)
Immediate Anesthesia Transfer of Care Note  Patient: Nation Cradle  Procedure(s) Performed: LEFT ABOVE KNEE AMPUTATION (Left Knee)  Patient Location: PACU  Anesthesia Type:General  Level of Consciousness: drowsy  Airway & Oxygen Therapy: Patient Spontanous Breathing and Patient connected to nasal cannula oxygen  Post-op Assessment: Report given to RN, Post -op Vital signs reviewed and stable and Patient moving all extremities  Post vital signs: Reviewed and stable  Last Vitals:  Vitals Value Taken Time  BP    Temp    Pulse    Resp    SpO2      Last Pain:  Vitals:   01/30/20 0809  TempSrc:   PainSc: 9       Patients Stated Pain Goal: 3 (01/30/20 0809)  Complications: No apparent anesthesia complications

## 2020-01-30 NOTE — Anesthesia Preprocedure Evaluation (Addendum)
Anesthesia Evaluation  Patient identified by MRN, date of birth, ID band Patient awake    Reviewed: Allergy & Precautions, NPO status , Patient's Chart, lab work & pertinent test results  History of Anesthesia Complications Negative for: history of anesthetic complications  Airway Mallampati: II  TM Distance: >3 FB Neck ROM: Full    Dental  (+) Dental Advisory Given, Poor Dentition, Chipped,    Pulmonary Current SmokerPatient did not abstain from smoking.,    Pulmonary exam normal breath sounds clear to auscultation       Cardiovascular hypertension, Pt. on medications + Peripheral Vascular Disease  Normal cardiovascular exam Rhythm:Regular Rate:Normal     Neuro/Psych negative neurological ROS  negative psych ROS   GI/Hepatic negative GI ROS, Neg liver ROS,   Endo/Other  negative endocrine ROSObesity   Renal/GU negative Renal ROS     Musculoskeletal S/p L BKA   Abdominal   Peds  Hematology  (+) Blood dyscrasia (Plavix), ,   Anesthesia Other Findings Day of surgery medications reviewed with the patient.  Reproductive/Obstetrics                            Anesthesia Physical Anesthesia Plan  ASA: III  Anesthesia Plan: General   Post-op Pain Management:    Induction: Intravenous  PONV Risk Score and Plan: 2 and Midazolam, Dexamethasone and Ondansetron  Airway Management Planned: Oral ETT  Additional Equipment:   Intra-op Plan:   Post-operative Plan: Extubation in OR  Informed Consent: I have reviewed the patients History and Physical, chart, labs and discussed the procedure including the risks, benefits and alternatives for the proposed anesthesia with the patient or authorized representative who has indicated his/her understanding and acceptance.     Dental advisory given  Plan Discussed with: CRNA  Anesthesia Plan Comments:         Anesthesia Quick  Evaluation

## 2020-01-30 NOTE — Anesthesia Procedure Notes (Signed)
Procedure Name: Intubation Date/Time: 01/30/2020 10:31 AM Performed by: Stacyann Mcconaughy T, CRNA Pre-anesthesia Checklist: Patient identified, Emergency Drugs available, Suction available and Patient being monitored Patient Re-evaluated:Patient Re-evaluated prior to induction Oxygen Delivery Method: Circle system utilized Preoxygenation: Pre-oxygenation with 100% oxygen Induction Type: IV induction Ventilation: Oral airway inserted - appropriate to patient size, Mask ventilation with difficulty and Two handed mask ventilation required Laryngoscope Size: Miller and 3 Grade View: Grade I Tube type: Oral Tube size: 7.5 mm Number of attempts: 1 Airway Equipment and Method: Stylet and Oral airway Placement Confirmation: ETT inserted through vocal cords under direct vision,  positive ETCO2 and breath sounds checked- equal and bilateral Secured at: 23 cm Tube secured with: Tape Dental Injury: Teeth and Oropharynx as per pre-operative assessment

## 2020-01-30 NOTE — Anesthesia Postprocedure Evaluation (Signed)
Anesthesia Post Note  Patient: Sean [name]  Procedure(s) Performed: LEFT ABOVE KNEE AMPUTATION (Left Knee)     Patient location during evaluation: PACU Anesthesia Type: General Level of consciousness: awake and alert Pain management: pain level controlled Vital Signs Assessment: post-procedure vital signs reviewed and stable Respiratory status: spontaneous breathing, nonlabored ventilation, respiratory function stable and patient connected to nasal cannula oxygen Cardiovascular status: blood pressure returned to baseline and stable Postop Assessment: no apparent nausea or vomiting Anesthetic complications: no    Last Vitals:  Vitals:   01/30/20 1400 01/30/20 1515  BP: (!) 124/92 (!) 127/91  Pulse: 81   Resp: 15   Temp: (!) 36.2 C   SpO2: 96%     Last Pain:  Vitals:   01/30/20 1545  TempSrc:   PainSc: Asleep                 Odin Mariani COKER

## 2020-01-31 LAB — SURGICAL PATHOLOGY

## 2020-01-31 NOTE — Progress Notes (Signed)
Mobility Specialist: Progress Note    01/31/20 1712  Mobility  Activity Ambulated in room  Level of Assistance Contact guard assist, steadying assist  Assistive Device Front wheel walker  Distance Ambulated (ft) 30 ft  Mobility Response Tolerated well  Mobility performed by Mobility specialist  $Mobility charge 1 Mobility   Pre-Mobility: 87 HR, 125/88 BP, 96% SpO2 Post-Mobility: 90 HR, 116/80 BP, 99% SpO2  Pt tolerated ambulation well. Pt c/o dizziness and pain in L lower extremity during ambulation. Pain and dizziness resolved after sitting back down.   [facility] [name] Mobility Specialist

## 2020-01-31 NOTE — Progress Notes (Addendum)
[facility] [place]  Subjective  - Doing well.  Pain is better from before surgery, now just incisional pain and not nerve pain.   Objective 119/87 74 98.2 F (36.8 C) (Oral) 16 97%  Intake/Output Summary (Last 24 hours) at 01/31/2020 0730 Last data filed at 01/31/2020 0700 Gross per 24 hour  Intake 2220.19 ml  Output 1350 ml  Net 870.19 ml    Left AKA dressing clean and dry Lungs non labored breathing A & O x 3  Assessment/Planning: POD # 1 BKA to AKA  Plan to change dressing tomorrow PT eval pending likely home with PT [facility]  Mosetta Pigeon 01/31/2020 7:30 AM --  Laboratory Lab Results: Recent Labs    01/30/20 0724  WBC 8.1  HGB 15.5  HCT 47.9  PLT 359   BMET Recent Labs    01/30/20 0724  NA 138  K 3.6  CL 104  CO2 26  GLUCOSE 109*  BUN 10  CREATININE 0.83  CALCIUM 9.1    COAG Lab Results  Component Value Date   INR 1.0 01/30/2020   INR 1.2 05/05/2019   No results found for: PTT  I have independently interviewed and examined patient and agree with PA assessment and plan above.   Zakir Henner C. Randie Heinz, MD [facility] [place] Office: [phone] Pager: [phone]

## 2020-01-31 NOTE — Evaluation (Signed)
Physical Therapy Evaluation Patient Details Name: Sean [name] MRN: [id number] DOB: [birth year] Today's Date: 01/31/2020   History of Present Illness  Pt is a 49 y/o male admitted secondary to L BKA pain and possible osteomyelitis. Pt is s/p conversion to L AKA. PMH includes current smoker, HTN, PVD, and L BKA.   Clinical Impression  Pt admitted secondary to problem above with deficits below. Pt requiring min guard A to stand and transfer to recliner. Pt reporting increased pain upon standing, therefore, further mobility deferred. Pt reports his wife and dad can assist as needed. Feel he will progress well once pain controlled. Will continue to follow acutely to maximize functional mobility independence and safety.     Follow Up Recommendations Home health PT;Supervision for mobility/OOB    Equipment Recommendations  None recommended by PT    Recommendations for Other Services       Precautions / Restrictions Precautions Precautions: Fall Restrictions Weight Bearing Restrictions: Yes RLE Weight Bearing: Non weight bearing LLE Weight Bearing: Non weight bearing Other Position/Activity Restrictions: L AKA       Mobility  Bed Mobility Overal bed mobility: Needs Assistance Bed Mobility: Supine to Sit     Supine to sit: Supervision;HOB elevated     General bed mobility comments: Supervision for safety. Increased time required.   Transfers Overall transfer level: Needs assistance Equipment used: Rolling walker (2 wheeled) Transfers: Sit to/from [employer] Sit to Stand: From elevated surface;Min guard Stand pivot transfers: Min guard       General transfer comment: Min guard for safety to stand and transfer to recliner. Pt with increased pain in L residual limb so further mobility deferred.   Ambulation/Gait                Stairs            Wheelchair Mobility    Modified Rankin (Stroke Patients Only)       Balance Overall balance  assessment: Needs assistance Sitting-balance support: No upper extremity supported;Feet supported Sitting balance-Leahy Scale: Good     Standing balance support: Bilateral upper extremity supported;During functional activity Standing balance-Leahy Scale: Poor Standing balance comment: Reliant on BUE support                              Pertinent Vitals/Pain Pain Assessment: Faces Faces Pain Scale: Hurts even more Pain Location: L AKA  Pain Descriptors / Indicators: Sharp;Guarding;Grimacing Pain Intervention(s): Limited activity within patient's tolerance;Monitored during session;Repositioned    Home Living Family/patient expects to be discharged to:: Private residence Living Arrangements: Spouse/significant other;Children;Other relatives;Parent Available Help at Discharge: Available 24 hours/day Type of Home: House Home Access: Stairs to enter Entrance Stairs-Rails: Can reach both Entrance Stairs-Number of Steps: 3 Home Layout: Two level;1/2 bath on main level Home Equipment: Walker - 2 wheels;Shower seat;Wheelchair - manual      Prior Function Level of Independence: Independent with assistive device(s)         Comments: Has been using RW vs WC since L BKA became painful. Usually bumps up the steps inside. 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      Hand Dominance        Extremity/Trunk Assessment   Upper Extremity Assessment Upper Extremity Assessment: Overall WFL for tasks assessed    Lower Extremity Assessment Lower Extremity Assessment: LLE deficits/detail LLE Deficits / Details: s/p L AKA.     Cervical / Trunk Assessment Cervical /  Trunk Assessment: Normal  Communication   Communication: No difficulties  Cognition Arousal/Alertness: Awake/alert Behavior During Therapy: WFL for tasks assessed/performed Overall Cognitive Status: Within Functional Limits for tasks assessed                                         General Comments General comments (skin integrity, edema, etc.): Educated about desensitization techniques for L residual limb. Also educated about prone positioning to stretch hip flexors.     Exercises     Assessment/Plan    PT Assessment Patient needs continued PT services  PT Problem List Decreased strength;Decreased activity tolerance;Decreased mobility;Decreased balance;Pain       PT Treatment Interventions Gait training;DME instruction;Stair training;Functional mobility training;Therapeutic activities;Therapeutic exercise;Balance training;Patient/family education    PT Goals (Current goals can be found in the Care Plan section)  Acute Rehab PT Goals Patient Stated Goal: to go home PT Goal Formulation: With patient Time For Goal Achievement: 02/14/20 Potential to Achieve Goals: Good    Frequency Min 3X/week   Barriers to discharge        Co-evaluation               AM-PAC PT "6 Clicks" Mobility  Outcome Measure Help needed turning from your back to your side while in a flat bed without using bedrails?: None Help needed moving from lying on your back to sitting on the side of a flat bed without using bedrails?: None Help needed moving to and from a bed to a chair (including a wheelchair)?: A Little Help needed standing up from a chair using your arms (e.g., wheelchair or bedside chair)?: A Little Help needed to walk in hospital room?: A Little Help needed climbing 3-5 steps with a railing? : A Lot 6 Click Score: 19    End of Session Equipment Utilized During Treatment: Gait belt Activity Tolerance: Patient limited by pain Patient left: in chair;with call bell/phone within reach;with chair alarm set Nurse Communication: Mobility status PT Visit Diagnosis: Unsteadiness on feet (R26.81);Muscle weakness (generalized) (M62.81)    Time: 4008-6761 PT Time Calculation (min) (ACUTE ONLY): 25 min   Charges:   PT Evaluation $PT Eval Moderate Complexity: 1 Mod PT  Treatments $Therapeutic Activity: 8-22 mins        Lou Miner, DPT  Acute Rehabilitation Services  Pager: [phone] Office: [phone]   Rudean Hitt 01/31/2020, 11:09 AM

## 2020-01-31 NOTE — Progress Notes (Signed)
Plan of care reviewed. Pt's progressing. He's alert and oriented x 4. Remained afebrile, NSR on monitor, HR 70s-80s, BP within normal limits, SPO2 95-97% on room air, RR 11-16, Auscultated rhonchi bilaterally. Able to cough productively with white and clear sputum. No respiratory distress.   Left AKA dressing dry and clean, no bleeding. Pain tolerated well. Norco given as Pt needed. No immediate distress noted. We will continue to monitor.  Berniece Pap, RN

## 2020-01-31 NOTE — Progress Notes (Signed)
Orthopedic Tech Progress Note Patient Details:  Sean [name] 04-17-1971 [id number] Called in order for retention sock to hanger. [name], male   DOB: 27-Jun-1971, 49 y.o.   MRN: [id number]   [name] 01/31/2020, 12:12 PM

## 2020-02-01 LAB — HEMOGLOBIN A1C
Hgb A1c MFr Bld: 6.5 % — ABNORMAL HIGH (ref 4.8–5.6)
Mean Plasma Glucose: 139.85 mg/dL

## 2020-02-01 LAB — LIPID PANEL
Cholesterol: 166 mg/dL (ref 0–200)
HDL: 34 mg/dL — ABNORMAL LOW (ref >40)
LDL Cholesterol: 105 mg/dL — ABNORMAL HIGH (ref 0–99)
Total CHOL/HDL Ratio: 4.9 RATIO
Triglycerides: 136 mg/dL (ref <150)
VLDL: 27 mg/dL (ref 0–40)

## 2020-02-01 MED ORDER — ATORVASTATIN CALCIUM 80 MG PO TABS
80.0000 mg | ORAL_TABLET | Freq: Every day | ORAL | Status: DC
  Administered 2020-02-01 – 2020-02-02 (×2): 80 mg via ORAL
  Filled 2020-02-01 (×2): qty 1

## 2020-02-01 NOTE — Progress Notes (Signed)
Mobility Specialist: Progress Note    02/01/20 1421  Mobility  Activity Ambulated in room  Level of Assistance Modified independent, requires aide device or extra time  Assistive Device Front wheel walker  Distance Ambulated (ft) 120 ft (3x from chair to door and back)  Mobility Response Tolerated well  Mobility performed by Mobility specialist  Bed Position Chair  $Mobility charge 1 Mobility   Pre-Mobility: 110 HR, 141/84 BP, 96% SpO2 Post-Mobility: 110 HR, 129/87 BP, 96% SpO2  Pt tolerated ambulation well. We went from the chair to the door and back 3x with a sitting rest break between each.   [facility] Sean [name] Mobility Specialist

## 2020-02-01 NOTE — Progress Notes (Signed)
   VASCULAR SURGERY ASSESSMENT & PLAN:   POD 2 - LEFT AKA: healing nicely. Begin daily dressing changes.   DVT PROPHYLAXIS: on SQ heparin.  PT recommends home health PTx   SUBJECTIVE:   Minimal pain.   PHYSICAL EXAM:   Vitals:   01/31/20 1535 01/31/20 1955 01/31/20 2350 02/01/20 0423  BP: 122/76 129/75 (!) 132/93 (!) 140/91  Pulse: 89 78 84 89  Resp: 15 18 20 19   Temp: 98.3 F (36.8 C) 98 F (36.7 C) 99.4 F (37.4 C) 99.1 F (37.3 C)  TempSrc: Oral Oral Oral Oral  SpO2: 97% 96% 98% 96%  Weight:      Height:       Left AKA dressing changed - looks good. No significant drainage.  Redressed.   LABS:   Lab Results  Component Value Date   WBC 8.1 01/30/2020   HGB 15.5 01/30/2020   HCT 47.9 01/30/2020   MCV 79.8 (L) 01/30/2020   PLT 359 01/30/2020   Lab Results  Component Value Date   CREATININE 0.83 01/30/2020   Lab Results  Component Value Date   INR 1.0 01/30/2020   CBG (last 3)  No results for input(s): GLUCAP in the last 72 hours.  PROBLEM LIST:    Active Problems:   PAD (peripheral artery disease) (HCC)   CURRENT MEDS:   . amLODipine  10 mg Oral Daily  . aspirin EC  81 mg Oral Daily  . clopidogrel  75 mg Oral Q breakfast  . docusate sodium  100 mg Oral BID  . DULoxetine  30 mg Oral Daily  . heparin  5,000 Units Subcutaneous Q8H  . lisinopril  40 mg Oral Daily   And  . hydrochlorothiazide  25 mg Oral Daily  . pantoprazole  40 mg Oral Daily  . potassium chloride SA  20 mEq Oral Daily  . potassium chloride  20-40 mEq Oral Once  . pregabalin  100 mg Oral BID    03/31/2020 Office: Waverly Ferrari 02/01/2020

## 2020-02-01 NOTE — Progress Notes (Signed)
Physical Therapy Treatment Patient Details Name: Sean [name] MRN: [id number] DOB: 1970-09-06 Today's Date: 02/01/2020    History of Present Illness Pt is a 49 y/o male admitted secondary to L BKA pain and possible osteomyelitis. Pt is s/p conversion to L AKA. PMH includes current smoker, HTN, PVD, and L BKA.     PT Comments    Pt doing well with mobility. Ready for DC home from PT standpoint when medically ready.    Follow Up Recommendations  Home health PT;Supervision for mobility/OOB     Equipment Recommendations  None recommended by PT    Recommendations for Other Services       Precautions / Restrictions Precautions Precautions: Fall Restrictions Other Position/Activity Restrictions: L AKA     Mobility  Bed Mobility Overal bed mobility: Modified Independent Bed Mobility: Supine to Sit     Supine to sit: HOB elevated;Modified independent (Device/Increase time)        Transfers Overall transfer level: Needs assistance Equipment used: Rolling walker (2 wheeled) Transfers: Sit to/from Stand Sit to Stand: From elevated surface;Supervision         General transfer comment: Assist for lines/safety  Ambulation/Gait Ambulation/Gait assistance: Supervision Gait Distance (Feet): 60 Feet Assistive device: Rolling walker (2 wheeled) Gait Pattern/deviations: Step-to pattern Gait velocity: decr Gait velocity interpretation: 1.31 - 2.62 ft/sec, indicative of limited community ambulator General Gait Details: Steady gait with walker. Supervision for lines   Stairs             Wheelchair Mobility    Modified Rankin (Stroke Patients Only)       Balance Overall balance assessment: Needs assistance Sitting-balance support: No upper extremity supported;Feet supported Sitting balance-Leahy Scale: Good     Standing balance support: Bilateral upper extremity supported;During functional activity Standing balance-Leahy Scale: Poor Standing balance comment:  static standing modified independent with walker due to AKA                            Cognition Arousal/Alertness: Awake/alert Behavior During Therapy: WFL for tasks assessed/performed Overall Cognitive Status: Within Functional Limits for tasks assessed                                        Exercises      General Comments        Pertinent Vitals/Pain Pain Assessment: Faces Faces Pain Scale: Hurts even more Pain Location: L AKA  Pain Descriptors / Indicators: Sharp;Guarding;Grimacing Pain Intervention(s): Limited activity within patient's tolerance;Monitored during session    Home Living                      Prior Function            PT Goals (current goals can now be found in the care plan section) Acute Rehab PT Goals Patient Stated Goal: to go home Progress towards PT goals: Progressing toward goals    Frequency    Min 3X/week      PT Plan Current plan remains appropriate    Co-evaluation              AM-PAC PT "6 Clicks" Mobility   Outcome Measure  Help needed turning from your back to your side while in a flat bed without using bedrails?: None Help needed moving from lying on your back to sitting on the side of a  flat bed without using bedrails?: None Help needed moving to and from a bed to a chair (including a wheelchair)?: A Little Help needed standing up from a chair using your arms (e.g., wheelchair or bedside chair)?: A Little Help needed to walk in hospital room?: A Little Help needed climbing 3-5 steps with a railing? : A Lot 6 Click Score: 19    End of Session   Activity Tolerance: Patient tolerated treatment well Patient left: with call bell/phone within reach;in bed (sitting EOB)   PT Visit Diagnosis: Unsteadiness on feet (R26.81);Muscle weakness (generalized) (M62.81)     Time: 3491-7915 PT Time Calculation (min) (ACUTE ONLY): 10 min  Charges:  $Gait Training: 8-22 mins                      [facility] PT Acute Rehabilitation Services Pager [phone] Office [phone]    Angelina Ok [facility] 02/01/2020, 4:32 PM

## 2020-02-02 MED ORDER — HYDROCODONE-ACETAMINOPHEN 5-325 MG PO TABS: 1.0000 | ORAL_TABLET | Freq: Four times a day (QID) | ORAL | 0 refills | Status: DC | PRN

## 2020-02-02 NOTE — Progress Notes (Signed)
D/C tele and IV. Went over AVS with pt and all questions were answered. Sending pt home with AVS and ABD pads. \  Lawson Radar, RN

## 2020-02-02 NOTE — Care Management (Signed)
1521 02-02-20 Case Manager has worked to get patient home health services throughout the day. Patient has a blue local BCBS plan via [facility]. Case Manager did call all the agencies on the Medicare.gov list including [employer] and [employer]. Agencies- [facility] and [facility] not in network. Other agencies could not accept the patient due to staffing. Case Manager did call the Case Manager for [facility] Anselmo Pickler and she was only able to give me two names of in network agencies and [facility] is unable to accept the patient due to staffing and [facility] states not in network. Case Manager discussed outpatient therapy- he has used the [facility] and has worked with Marlou Sa PT- this session has been completed. Case Manager reached out to PA Clinton Gallant- and she stated that patient would not need home health or outpatient therapy at this time. No home health or outpatient services set up prior to transition home. Patient has durable medical equipment in the home. Staff RN is aware that she can have patient discharge home. Wife to provide transport home. No further needs from Case Manager at this time. Gala Lewandowsky, RN,NBSN Case Manager

## 2020-02-02 NOTE — Discharge Summary (Signed)
[facility] Discharge Summary   Patient ID:  Sean [name] MRN: [id number] DOB/AGE: 12-18-1970 49 y.o.  Admit date: 01/30/2020 Discharge date: 02/02/2020 Date of Surgery: 01/30/2020 Surgeon: Surgeon(s): Angelia Mould, MD  Admission Diagnosis: PAD (peripheral artery disease) [facility]) [I73.9]  Discharge Diagnoses:  PAD (peripheral artery disease) ([place]) [I73.9]  Secondary Diagnoses: Past Medical History:  Diagnosis Date  . Hypertension   . 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  He was scheduled for conversion to AKA.   Hospital Course:  Sean [name] is a 49 y.o. male is S/P Left Procedure(s): LEFT ABOVE KNEE AMPUTATION He gain good pain control and the stump appeared viable, warm to touch.  A retenesion sock was ordered.  He was discharged home in stable condition.     Consults:  Treatment Team:  Waynetta Sandy, MD  Significant Diagnostic Studies: CBC Lab Results  Component Value Date   WBC 8.1 01/30/2020   HGB 15.5 01/30/2020   HCT 47.9 01/30/2020   MCV 79.8 (L) 01/30/2020   PLT 359 01/30/2020    BMET    Component Value Date/Time   NA 138 01/30/2020 0724   K 3.6 01/30/2020 0724   CL 104 01/30/2020 0724   CO2 26 01/30/2020 0724   GLUCOSE 109 (H) 01/30/2020 0724   BUN 10 01/30/2020 0724   CREATININE 0.83 01/30/2020 0724   CALCIUM 9.1 01/30/2020 0724   GFRNONAA >60 01/30/2020 0724   GFRAA >60 01/30/2020 0724   COAG Lab Results  Component Value Date   INR 1.0 01/30/2020   INR 1.2 05/05/2019     Disposition:  Discharge to :Home Discharge Instructions    Call MD for:  redness, tenderness, or signs of infection (pain, swelling, bleeding, redness, odor or green/yellow discharge around incision site)   Complete by: As directed    Call MD for:   severe or increased pain, loss or decreased feeling  in affected limb(s)   Complete by: As directed    Call MD for:  temperature >100.5   Complete by: As directed    Resume previous diet   Complete by: As directed      Allergies as of 02/02/2020   No Known Allergies     Medication List    STOP taking these medications   traMADol 50 MG tablet Commonly known as: ULTRAM     TAKE these medications   amLODipine 10 MG tablet Commonly known as: NORVASC Take 10 mg by mouth daily.   aspirin EC 81 MG tablet Take 81 mg by mouth daily.   clopidogrel 75 MG tablet Commonly known as: PLAVIX Take 1 tablet (75 mg total) by mouth daily with breakfast.   DULoxetine 30 MG capsule Commonly known as: CYMBALTA Take 30 mg by mouth daily.   Humira Pen 40 MG/0.4ML Pnkt Generic drug: Adalimumab Inject 40 mg into the skin every 14 (fourteen) days.   HYDROcodone-acetaminophen 5-325 MG tablet Commonly known as: NORCO/VICODIN Take 1 tablet by mouth every 6 (six) hours as needed for severe pain.   lisinopril-hydrochlorothiazide 20-12.5 MG tablet Commonly known as: ZESTORETIC Take 2 tablets by mouth daily.   potassium chloride SA 20 MEQ tablet Commonly known as: KLOR-CON Take 20 mEq by mouth daily.   pregabalin 100 MG capsule Commonly known as: LYRICA Take 100 mg by mouth 2 (two)  times daily.   sildenafil 20 MG tablet Commonly known as: REVATIO Take 80 mg by mouth as needed (sexual activity).      Verbal and written Discharge instructions given to the patient. Wound care per Discharge AVS  Follow-up Information    Maeola Harman, MD Follow up in 4 week(s).   Specialties: Vascular Surgery, Cardiology Why: office will call Contact information: [address] [place] [address] [phone]               Signed: Mosetta Pigeon 02/02/2020, 5:44 PM

## 2020-02-02 NOTE — Discharge Instructions (Signed)
May shower wear retention sock with dry dressing until drainage stops.

## 2020-02-02 NOTE — Progress Notes (Addendum)
[facility] [place]  Subjective  - feeling well and ready to go home.   Objective (!) 143/88 77 97.8 F (36.6 C) (Axillary) 14 96%  Intake/Output Summary (Last 24 hours) at 02/02/2020 0752 Last data filed at 02/01/2020 2300 Gross per 24 hour  Intake 720 ml  Output 1625 ml  Net -905 ml    Left AKA stump healing pin point bleeding medially Stump warm with intact motor of hip. Lungs non labored breathing   Assessment/Planning: POD #3 Left BKA to AKA for nerve pain Has retention sock and HH PT ordered Plan to discharge home today, no equipment needs. F/U in 4 weeks for staple removal.  Stable disposition  Sean [name] 02/02/2020 7:52 AM --  Laboratory Lab Results: No results for input(s): WBC, HGB, HCT, PLT in the last 72 hours. BMET No results for input(s): NA, K, CL, CO2, GLUCOSE, BUN, CREATININE, CALCIUM in the last 72 hours.  COAG Lab Results  Component Value Date   INR 1.0 01/30/2020   INR 1.2 05/05/2019   No results found for: PTT  I have independently interviewed and examined patient and agree with PA assessment and plan above.   Jaidyn Usery C. Randie Heinz, MD [facility] [place] Office: [phone] Pager: [phone]

## 2020-02-13 ENCOUNTER — Other Ambulatory Visit: Payer: Self-pay

## 2020-02-13 ENCOUNTER — Emergency Department (HOSPITAL_COMMUNITY)
Admission: EM | Admit: 2020-02-13 | Discharge: 2020-02-13 | Disposition: A | Discharge status: Home / Self Care | Payer: BLUE CROSS/BLUE SHIELD | Attending: Emergency Medicine | Admitting: Emergency Medicine

## 2020-02-13 ENCOUNTER — Encounter (HOSPITAL_COMMUNITY): Payer: Self-pay | Admitting: *Deleted

## 2020-02-13 ENCOUNTER — Ambulatory Visit (INDEPENDENT_AMBULATORY_CARE_PROVIDER_SITE_OTHER): Payer: BLUE CROSS/BLUE SHIELD | Admitting: Physician Assistant

## 2020-02-13 ENCOUNTER — Telehealth: Payer: Self-pay

## 2020-02-13 DIAGNOSIS — I739 Peripheral vascular disease, unspecified: Secondary | ICD-10-CM | POA: Diagnosis not present

## 2020-02-13 DIAGNOSIS — Z7901 Long term (current) use of anticoagulants: Secondary | ICD-10-CM | POA: Insufficient documentation

## 2020-02-13 DIAGNOSIS — Z6835 Body mass index (BMI) 35.0-35.9, adult: Secondary | ICD-10-CM | POA: Diagnosis not present

## 2020-02-13 DIAGNOSIS — I10 Essential (primary) hypertension: Secondary | ICD-10-CM | POA: Insufficient documentation

## 2020-02-13 DIAGNOSIS — Z7982 Long term (current) use of aspirin: Secondary | ICD-10-CM | POA: Diagnosis not present

## 2020-02-13 DIAGNOSIS — Z79899 Other long term (current) drug therapy: Secondary | ICD-10-CM | POA: Diagnosis not present

## 2020-02-13 DIAGNOSIS — L7622 Postprocedural hemorrhage and hematoma of skin and subcutaneous tissue following other procedure: Secondary | ICD-10-CM | POA: Insufficient documentation

## 2020-02-13 DIAGNOSIS — G894 Chronic pain syndrome: Secondary | ICD-10-CM | POA: Insufficient documentation

## 2020-02-13 DIAGNOSIS — E669 Obesity, unspecified: Secondary | ICD-10-CM | POA: Insufficient documentation

## 2020-02-13 DIAGNOSIS — Z4889 Encounter for other specified surgical aftercare: Secondary | ICD-10-CM

## 2020-02-13 DIAGNOSIS — F17210 Nicotine dependence, cigarettes, uncomplicated: Secondary | ICD-10-CM | POA: Insufficient documentation

## 2020-02-13 DIAGNOSIS — Z89612 Acquired absence of left leg above knee: Secondary | ICD-10-CM | POA: Diagnosis not present

## 2020-02-13 DIAGNOSIS — T879 Unspecified complications of amputation stump: Secondary | ICD-10-CM

## 2020-02-13 HISTORY — DX: Chronic pain syndrome: G89.4

## 2020-02-13 HISTORY — DX: Unspecified complications of amputation stump: T87.9

## 2020-02-13 LAB — BASIC METABOLIC PANEL
Anion gap: 9 (ref 5–15)
BUN: 10 mg/dL (ref 6–20)
CO2: 25 mmol/L (ref 22–32)
Calcium: 8.9 mg/dL (ref 8.9–10.3)
Chloride: 105 mmol/L (ref 98–111)
Creatinine, Ser: 1 mg/dL (ref 0.61–1.24)
GFR calc Af Amer: >60 mL/min (ref >60)
GFR calc non Af Amer: >60 mL/min (ref >60)
Glucose, Bld: 117 mg/dL — ABNORMAL HIGH (ref 70–99)
Potassium: 3.7 mmol/L (ref 3.5–5.1)
Sodium: 139 mmol/L (ref 135–145)

## 2020-02-13 LAB — CBC
HCT: 44.7 % (ref 39.0–52.0)
Hemoglobin: 14.4 g/dL (ref 13.0–17.0)
MCH: 26 pg (ref 26.0–34.0)
MCHC: 32.2 g/dL (ref 30.0–36.0)
MCV: 80.8 fL (ref 80.0–100.0)
Platelets: 388 10*3/uL (ref 150–400)
RBC: 5.53 MIL/uL (ref 4.22–5.81)
RDW: 15.7 % — ABNORMAL HIGH (ref 11.5–15.5)
WBC: 10.7 10*3/uL — ABNORMAL HIGH (ref 4.0–10.5)
nRBC: 0 % (ref 0.0–0.2)

## 2020-02-13 NOTE — ED Triage Notes (Signed)
Pt was recently hospitalized and had higher amputation above the left knee.  Pt reports bleeding for 7 hours and has changed dressing 4-5 times today.  Pt states blood is dark red, and he is on Plavix and ASA.  Pt states he feels weak.  Notified Dr. Darrick Penna he was on his way up to ED and for ED to call Dr. Darrick Penna

## 2020-02-13 NOTE — Progress Notes (Signed)
    Postoperative Visit    History of Present Illness   Sean [name] is a 49 y.o. male who presents for postoperative follow-up for: left above-the-knee amputation (Date: 01/30/20).  He presented to the emergency department due to a slow steady bleed from his left above-the-knee amputation incision.  Patient states it had not stopped bleeding for about 7 hours.  A pressure dressing was applied in the emergency department.  Hemoglobin was also checked and was 14.4.  There was concern for underlying hematoma thus he was set up for follow-up today.  When dressing was removed in the office today no further bleeding was noted.     For VQI Use Only   PRE-ADM LIVING: Home  AMB STATUS: Wheelchair   Physical Examination   Vitals:   02/13/20 1309  BP: 114/78  Pulse: 89  Resp: 18  SpO2: 97%    LLE: Stump incision is healing well; no further bleeding noted; no palpable firm hematoma underlying site of bleeding; skin edges well approximated; staples remain in place   Medical Decision Making   Sean [name] is a 49 y.o. male who presents s/p left above-the-knee amputation.   Pressure dressing was removed the office and no further bleeding was noted  No indication for removal of staples as there is no palpable firm hematoma on exam  Dressing was reapplied  Patient will follow-up as scheduled in 2 weeks for possible staple removal  Emilie Rutter PA-C [facility] [place] Office: [phone]  Clinic MD: Early

## 2020-02-13 NOTE — Telephone Encounter (Signed)
Staff message received from Dr. Darrick Penna advising to contact pt this morning and schedule him an appt to check his stump today for reported oozing yesterday evening. Pt contacted and scheduled with PA today at 1:05pm. Pt voiced understanding.

## 2020-02-13 NOTE — Discharge Instructions (Addendum)
Follow-up with Dr. Darrick Penna for wound check.  Do not remove bandage until follow-up.

## 2020-02-13 NOTE — ED Provider Notes (Signed)
[facility] EMERGENCY DEPARTMENT Provider Note   CSN: [id number] Arrival date & time: 02/13/20  0034     History Chief Complaint  Patient presents with  . Post-op Problem    Left stump bleeding 7 hours    Zerek Litsey is a 49 y.o. male.  HPI     This a 49 year old male with a history of hypertension, peripheral vascular disease, recent left AKA by Dr. Darrick Penna on June 8 who presents with concern for bleeding from his incision.  Patient reports that he had significant bleeding from his stump incision starting yesterday.  Denies trauma.  Denies any significant pain.  He states that he bled through the dressing.  He has not felt dizzy or systemically ill.  Has no other complaints at this time.  He is on aspirin and Plavix.  Past Medical History:  Diagnosis Date  . Hypertension   . PVD (peripheral vascular disease) [facility])     Patient Active Problem List   Diagnosis Date Noted  . History of left below knee amputation (HCC) 05/16/2019  . Ischemia of left lower extremity 04/28/2019  . PAD (peripheral artery disease) (HCC) 04/28/2019  . Ischemia of extremity 09/21/2018  . PVD (peripheral vascular disease) (H[facility]) 09/20/2018    Past Surgical History:  Procedure Laterality Date  . ABDOMINAL AORTOGRAM W/LOWER EXTREMITY N/A 05/08/2019   Procedure: ABDOMINAL AORTOGRAM W/LOWER EXTREMITY;  Surgeon: Maeola Harman, MD;  Location[facility] INVASIVE CV LAB;  Service: Cardiovascular;  Laterality: N/A;  . AMPUTATION Left 01/30/2020   Procedure: LEFT ABOVE KNEE AMPUTATION;  Surgeon: Chuck Hint, MD;  Location: [facility] OR;  Service: Vascular;  Laterality: Left;  . ANGIOPLASTY Left 04/27/2019   Procedure: Balloon Angioplasty of left posterior tibial artery;  Surgeon: Maeola Harman, MD;  Location: [facility] OR;  Service: Vascular;  Laterality: Left;  . EMBOLECTOMY Left 09/21/2018   Procedure: THROMBECTOMY LEFT POPLITEAL ARTERY BYPASS LEFT POPLITEAL TO TIBIAL ARTERY  WITH SAPHENOUS VEIN HARVEST - FASCIOTOMY;  Surgeon: Chuck Hint, MD;  Location: [facility] OR;  Service: Vascular;  Laterality: Left;  . finger surgery Right    small finger  (2 different surgeries)  . LOWER EXTREMITY ANGIOGRAM Left 04/27/2019   Procedure: LEFT LOWER EXTREMITY ANGIOGRAM;  Surgeon: Maeola Harman, MD;  Location: [facility] OR;  Service: Vascular;  Laterality: Left;  . PERIPHERAL VASCULAR BALLOON ANGIOPLASTY Left 05/08/2019   Procedure: PERIPHERAL VASCULAR BALLOON ANGIOPLASTY;  Surgeon: Maeola Harman, MD;  Location: [facility] INVASIVE CV LAB;  Service: Cardiovascular;  Laterality: Left;  Posterior tibial  . PERIPHERAL VASCULAR INTERVENTION Left 04/28/2019   Procedure: PERIPHERAL VASCULAR INTERVENTION;  Surgeon: Cephus Shelling, MD;  Location: [facility] INVASIVE CV LAB;  Service: Cardiovascular;  Laterality: Left;  . PERIPHERAL VASCULAR THROMBECTOMY Bilateral 04/28/2019   Procedure: PERIPHERAL VASCULAR THROMBECTOMY;  Surgeon: Cephus Shelling, MD;  Location: [facility] INVASIVE CV LAB;  Service: Cardiovascular;  Laterality: Bilateral;  . PERIPHERAL VASCULAR THROMBECTOMY Left 05/08/2019   Procedure: PERIPHERAL VASCULAR THROMBECTOMY;  Surgeon: Maeola Harman, MD;  Location: [facility] INVASIVE CV LAB;  Service: Cardiovascular;  Laterality: Left;  posterior tibial  . TRANSMETATARSAL AMPUTATION Left 05/09/2019   Procedure: Left Below Knee Amputation;  Surgeon: Maeola Harman, MD;  Location: [facility] OR;  Service: Vascular;  Laterality: Left;       Family History  Problem Relation Age of Onset  . Diabetes Father     Social History   Tobacco Use  . Smoking status: Current Every Day Smoker  Packs/day: 1.00    Types: Cigarettes  . Smokeless tobacco: Never Used  Vaping Use  . Vaping Use: Never used  Substance Use Topics  . Alcohol use: Never  . Drug use: Never    Home Medications Prior to Admission medications   Medication Sig Start Date End Date Taking? Authorizing  Provider  acetaminophen (TYLENOL) 500 MG tablet Take 1,000 mg by mouth every 6 (six) hours as needed.   Yes [provider]  Adalimumab (HUMIRA PEN) 40 MG/0.4ML PNKT Inject 40 mg into the skin every 14 (fourteen) days.   Yes [provider]  amLODipine (NORVASC) 10 MG tablet Take 10 mg by mouth daily. 11/23/18  Yes [provider]  aspirin EC 81 MG tablet Take 81 mg by mouth daily.   Yes [provider]  clopidogrel (PLAVIX) 75 MG tablet Take 1 tablet (75 mg total) by mouth daily with breakfast. 04/30/19  Yes Emilie Rutter, PA-C  DULoxetine (CYMBALTA) 30 MG capsule Take 30 mg by mouth daily.  01/09/20  Yes [provider]  HYDROcodone-acetaminophen (NORCO/VICODIN) 5-325 MG tablet Take 1 tablet by mouth every 6 (six) hours as needed for severe pain. 02/02/20  Yes Lars Mage, PA-C  lisinopril-hydrochlorothiazide (ZESTORETIC) 20-12.5 MG tablet Take 2 tablets by mouth daily.  11/02/18  Yes [provider]  potassium chloride SA (KLOR-CON) 20 MEQ tablet Take 20 mEq by mouth daily.   Yes [provider]  pregabalin (LYRICA) 100 MG capsule Take 100 mg by mouth 2 (two) times daily.   Yes [provider]  sildenafil (REVATIO) 20 MG tablet Take 80 mg by mouth as needed (sexual activity).  05/04/19  Yes [provider]    Allergies    Patient has no known allergies.  Review of Systems   Review of Systems  Constitutional: Negative for fever.  Musculoskeletal:       No pain  Skin: Positive for wound. Negative for color change.  All other systems reviewed and are negative.   Physical Exam Updated Vital Signs BP 123/81 (BP Location: Left Arm)   Pulse 83   Temp 97.6 F (36.4 C)   Resp (!) 25   Ht 1.956 m (6\' 5" )   Wt 135.2 kg   SpO2 98%   BMI 35.34 kg/m   Physical Exam Vitals and nursing note reviewed.  Constitutional:      Appearance: He is well-developed. He is obese.  HENT:     Head: Normocephalic and  atraumatic.     Mouth/Throat:     Mouth: Mucous membranes are moist.  Eyes:     Pupils: Pupils are equal, round, and reactive to light.  Cardiovascular:     Rate and Rhythm: Normal rate and regular rhythm.  Pulmonary:     Effort: Pulmonary effort is normal. No respiratory distress.  Musculoskeletal:     Cervical back: Neck supple.     Comments: Left AKA stump visualized fully, well-healing surgical incision with staples in place, no adjacent erythema, there is a punctate area of oozing dark blood, no underlying palpable fluctuance or tenderness to palpation  Skin:    General: Skin is warm and dry.  Neurological:     Mental Status: He is alert and oriented to person, place, and time.  Psychiatric:        Mood and Affect: Mood normal.     ED Results / Procedures / Treatments   Labs (all labs ordered are listed, but only abnormal results are displayed) Labs Reviewed  BASIC METABOLIC PANEL - Abnormal; Notable for the following components:      Result Value   Glucose, Bld 117 (*)    All other components within normal limits  CBC - Abnormal; Notable for the following components:   WBC 10.7 (*)    RDW 15.7 (*)    All other components within normal limits    EKG None  Radiology No results found.  Procedures Procedures (including critical care time)  Medications Ordered in ED Medications - No data to display  ED Course  I have reviewed the triage vital signs and the nursing notes.  Pertinent labs & imaging results that were available during my care of the patient were reviewed by me and considered in my medical decision making (see chart for details).    MDM Rules/Calculators/A&P                          Patient presents with concerns for bleeding from his surgical site.  He is overall nontoxic and vital signs reassuring.  Labs reviewed from triage.  Hemoglobin is 14.4 which suggest no significant bleeding.  Oozing from wound is dark which makes me question whether  there is a small subacute underlying hematoma.  There is no significant tenderness.  There is no redness or swelling to suggest infection or abscess.  Small compressive dressing was applied.  Instructed the patient to keep dressing in place and follow-up closely with Dr. Oneida Alar.  Do not feel he needs emergent vascular surgery consultation at this time.  After history, exam, and medical workup I feel the patient has been appropriately medically screened and is safe for discharge home. Pertinent diagnoses were discussed with the patient. Patient was given return precautions.   Final Clinical Impression(s) / ED Diagnoses Final diagnoses:  Encounter for post surgical wound check    Rx / DC Orders ED Discharge Orders    None       Natash Berman, Barbette Hair, MD 02/13/20 [phone]

## 2020-02-28 NOTE — Progress Notes (Signed)
POST OPERATIVE OFFICE NOTE    CC:  F/u for surgery  HPI:  This is a 49 y.o. male who is s/p left AKA on 01/30/2020 by Dr. Edilia Bo.  He had previously undergone a left BKA and was converted to AKA.    Pt had presented to the ED for slow steady bleed from the left AKA site and was ongoing for about 7 hours.  A pressure dressing was applied and pt followed up with [facility] on 6/22.  At that time, the pressure dressing was removed and the bleeding had stopped.  There had been concern for underlying hematoma, but there was no firm palpable hematoma on exam.  The stump was redressed and he was scheduled to f/u in a couple of weeks for possible staple removal.    Pt returns today for follow up.  He states he is doing well and incision has healed.  He states his left leg does not have any wounds present.  He is walking some with a walker.  Has an appt with [facility] on Tuesday.  No Known Allergies  Current Outpatient Medications  Medication Sig Dispense Refill  . acetaminophen (TYLENOL) 500 MG tablet Take 1,000 mg by mouth every 6 (six) hours as needed.    . Adalimumab (HUMIRA PEN) 40 MG/0.4ML PNKT Inject 40 mg into the skin every 14 (fourteen) days.    Marland Kitchen amLODipine (NORVASC) 10 MG tablet Take 10 mg by mouth daily.    Marland Kitchen aspirin EC 81 MG tablet Take 81 mg by mouth daily.    . clopidogrel (PLAVIX) 75 MG tablet Take 1 tablet (75 mg total) by mouth daily with breakfast. 30 tablet 2  . DULoxetine (CYMBALTA) 30 MG capsule Take 30 mg by mouth daily.     Marland Kitchen HYDROcodone-acetaminophen (NORCO/VICODIN) 5-325 MG tablet Take 1 tablet by mouth every 6 (six) hours as needed for severe pain. 16 tablet 0  . lisinopril-hydrochlorothiazide (ZESTORETIC) 20-12.5 MG tablet Take 2 tablets by mouth daily.     . potassium chloride SA (KLOR-CON) 20 MEQ tablet Take 20 mEq by mouth daily.    . pregabalin (LYRICA) 100 MG capsule Take 100 mg by mouth 2 (two) times daily.    . sildenafil (REVATIO) 20 MG tablet Take 80 mg by mouth as  needed (sexual activity).      No current facility-administered medications for this visit.     ROS:  See HPI  Physical Exam:  Today's Vitals   03/01/20 1356  BP: 138/89  Pulse: 91  Resp: 20  Temp: (!) 96.8 F (36 C)  TempSrc: Temporal  SpO2: 97%  Weight: 278 lb (126.1 kg)  Height: 6\' 5"  (1.956 m)   Body mass index is 32.97 kg/m.   Incision:  Healed nicely with staples in tact.    Assessment/Plan:  This is a 49 y.o. male who is s/p:  left AKA on 01/30/2020 by Dr. 03/31/2020.  He had previously undergone a left BKA and was converted to AKA.    -incision of left AKA has healed nicely.  Will get staples out today.   -will have him return in 3 months for ABI and arterial duplex of the right leg.  Discussed with Dr. Edilia Bo and he is in agreement.  Pt knows to return sooner should he have any issues or non healing wounds before then. -discussed at detail importance of smoking cessation.  Given he has decreased ABI on the right, he would be at increased risk for non healing wounds and subsequent  amputation. -pt given rx for stump shrinker. -pt states he has never been on a statin and does not have any liver issues.  Will start a statin today.  Talked to pt about myalgias.  He will f/u with his PCP, Serena Croissant in 6-8 weeks for lab work.     Doreatha Massed, [facility] [phone]  Clinic MD:  Randie Heinz

## 2020-03-01 ENCOUNTER — Ambulatory Visit (INDEPENDENT_AMBULATORY_CARE_PROVIDER_SITE_OTHER): Payer: Self-pay | Admitting: Physician Assistant

## 2020-03-01 ENCOUNTER — Other Ambulatory Visit: Payer: Self-pay

## 2020-03-01 VITALS — BP 138/89 | HR 91 | Temp 96.8°F | Resp 20 | Ht 77.0 in | Wt 278.0 lb

## 2020-03-01 DIAGNOSIS — I739 Peripheral vascular disease, unspecified: Secondary | ICD-10-CM

## 2020-03-01 DIAGNOSIS — Z89612 Acquired absence of left leg above knee: Secondary | ICD-10-CM

## 2020-03-01 MED ORDER — ROSUVASTATIN CALCIUM 10 MG PO TABS
10.0000 mg | ORAL_TABLET | Freq: Every day | ORAL | 3 refills | Status: DC
Start: 2020-03-01 — End: 2020-05-17

## 2020-03-05 ENCOUNTER — Other Ambulatory Visit: Payer: Self-pay | Admitting: *Deleted

## 2020-03-05 DIAGNOSIS — I739 Peripheral vascular disease, unspecified: Secondary | ICD-10-CM

## 2020-03-05 DIAGNOSIS — Z95828 Presence of other vascular implants and grafts: Secondary | ICD-10-CM

## 2020-03-21 ENCOUNTER — Ambulatory Visit (INDEPENDENT_AMBULATORY_CARE_PROVIDER_SITE_OTHER): Payer: Self-pay | Admitting: Physician Assistant

## 2020-03-21 ENCOUNTER — Other Ambulatory Visit: Payer: Self-pay

## 2020-03-21 VITALS — BP 127/94 | HR 89 | Temp 96.5°F | Resp 20 | Ht 77.0 in | Wt 278.0 lb

## 2020-03-21 DIAGNOSIS — T879 Unspecified complications of amputation stump: Secondary | ICD-10-CM

## 2020-03-21 NOTE — Progress Notes (Addendum)
    Postoperative Visit    History of Present Illness   Sean [name] is a 49 y.o. male who presents for postoperative follow-up for: left above-the-knee amputation by Dr. Edilia Bo (Date: 01/30/20).  The patient's wounds are healed.  The patient notes pain is well controlled.  He would like to proceed with referral to [facility] to start process for wearing a prosthetic.  He denies any tissue loss or rest pain of R foot.   For VQI Use Only   PRE-ADM LIVING: Home  AMB STATUS: Wheelchair   Physical Examination   Vitals:   03/21/20 0813  BP: (!) 127/94  Pulse: 89  Resp: 20  Temp: (!) 96.5 F (35.8 C)  SpO2: 96%    LLE: Stump incision is healed   Medical Decision Making   Sean [name] is a 49 y.o. male who presents s/p left above-the-knee amputation.   The patient's left AKA stump is well healed  I believe he is a great candidate for walking with a prosthesis, so he will be referred to [employer] to start prosthetic leg process  RLE arterial duplex and ABI will be checked in October of this year  Patient will call with any questions or problems  Emilie Rutter PA-C [facility] [place] Office: [phone]  Clinic MD: Edilia Bo

## 2020-04-06 DIAGNOSIS — N521 Erectile dysfunction due to diseases classified elsewhere: Secondary | ICD-10-CM

## 2020-04-06 HISTORY — DX: Erectile dysfunction due to diseases classified elsewhere: N52.1

## 2020-04-27 ENCOUNTER — Encounter (HOSPITAL_COMMUNITY): Payer: Self-pay | Admitting: Emergency Medicine

## 2020-04-27 ENCOUNTER — Emergency Department (HOSPITAL_COMMUNITY)
Admission: EM | Admit: 2020-04-27 | Discharge: 2020-04-27 | Disposition: A | Discharge status: Home / Self Care | Payer: BLUE CROSS/BLUE SHIELD | Attending: Emergency Medicine | Admitting: Emergency Medicine

## 2020-04-27 ENCOUNTER — Other Ambulatory Visit: Payer: Self-pay

## 2020-04-27 DIAGNOSIS — M25422 Effusion, left elbow: Secondary | ICD-10-CM | POA: Diagnosis not present

## 2020-04-27 DIAGNOSIS — Z5321 Procedure and treatment not carried out due to patient leaving prior to being seen by health care provider: Secondary | ICD-10-CM | POA: Diagnosis not present

## 2020-04-27 NOTE — ED Notes (Signed)
Pt called,no answer.

## 2020-04-27 NOTE — ED Triage Notes (Signed)
C/o L elbow swelling that started today.  States he may have been bit by an insect.  Denies pain.  Denies fever and chills.

## 2020-04-27 NOTE — ED Notes (Signed)
Pt called for a room, no answer ?

## 2020-05-17 ENCOUNTER — Other Ambulatory Visit: Payer: Self-pay | Admitting: Physician Assistant

## 2020-06-04 ENCOUNTER — Other Ambulatory Visit: Payer: Self-pay | Admitting: Physician Assistant

## 2020-06-04 DIAGNOSIS — I739 Peripheral vascular disease, unspecified: Secondary | ICD-10-CM

## 2020-06-07 ENCOUNTER — Other Ambulatory Visit: Payer: Self-pay

## 2020-06-07 ENCOUNTER — Ambulatory Visit (INDEPENDENT_AMBULATORY_CARE_PROVIDER_SITE_OTHER)
Admission: RE | Admit: 2020-06-07 | Discharge: 2020-06-07 | Disposition: A | Discharge status: Home / Self Care | Payer: BLUE CROSS/BLUE SHIELD | Source: Ambulatory Visit | Attending: Physician Assistant | Admitting: Physician Assistant

## 2020-06-07 ENCOUNTER — Ambulatory Visit (HOSPITAL_COMMUNITY)
Admission: RE | Admit: 2020-06-07 | Discharge: 2020-06-07 | Disposition: A | Discharge status: Home / Self Care | Payer: BLUE CROSS/BLUE SHIELD | Source: Ambulatory Visit | Attending: Physician Assistant | Admitting: Physician Assistant

## 2020-06-07 ENCOUNTER — Ambulatory Visit (INDEPENDENT_AMBULATORY_CARE_PROVIDER_SITE_OTHER): Payer: BLUE CROSS/BLUE SHIELD | Admitting: Physician Assistant

## 2020-06-07 VITALS — BP 150/94 | HR 82 | Temp 98.3°F | Resp 20 | Ht 77.0 in | Wt 300.3 lb

## 2020-06-07 DIAGNOSIS — I739 Peripheral vascular disease, unspecified: Secondary | ICD-10-CM | POA: Insufficient documentation

## 2020-06-07 DIAGNOSIS — I779 Disorder of arteries and arterioles, unspecified: Secondary | ICD-10-CM

## 2020-06-07 DIAGNOSIS — Z95828 Presence of other vascular implants and grafts: Secondary | ICD-10-CM | POA: Insufficient documentation

## 2020-06-07 NOTE — Progress Notes (Signed)
Office Note     CC:  follow up Requesting Provider:  [facility], [facility]*  HPI: Sean [name] is a 49 y.o. (1971-05-02) male who presents with a history of peripheral arterial disease who presents for routine follow-up of his right lower extremity.  He had several interventions including bypass of his left lower extremity but ultimately required left above-the-knee amputation.  This was performed on January 30, 2020 by Dr. Edilia Bo.  He just received his left above-the-knee prosthesis and is ambulating with this in a rolling walker.  He states he has some right anterior shin pain when ambulating.  He denies rest pain.  Previous ABI performed in December 2020 revealed right ABI of 0.67 with monophasic signal.  His toe pressure was 0.  He is compliant with Plavix, aspirin and statin.  Past Medical History:  Diagnosis Date  . Hypertension   . PVD (peripheral vascular disease) (HCC)     Past Surgical History:  Procedure Laterality Date  . ABDOMINAL AORTOGRAM W/LOWER EXTREMITY N/A 05/08/2019   Procedure: ABDOMINAL AORTOGRAM W/LOWER EXTREMITY;  Surgeon: Maeola Harman, MD;  Location: [facility] INVASIVE CV LAB;  Service: Cardiovascular;  Laterality: N/A;  . AMPUTATION Left 01/30/2020   Procedure: LEFT ABOVE KNEE AMPUTATION;  Surgeon: Chuck Hint, MD;  Location: [facility] OR;  Service: Vascular;  Laterality: Left;  . ANGIOPLASTY Left 04/27/2019   Procedure: Balloon Angioplasty of left posterior tibial artery;  Surgeon: Maeola Harman, MD;  Location: [facility] OR;  Service: Vascular;  Laterality: Left;  . EMBOLECTOMY Left 09/21/2018   Procedure: THROMBECTOMY LEFT POPLITEAL ARTERY BYPASS LEFT POPLITEAL TO TIBIAL ARTERY WITH SAPHENOUS VEIN HARVEST - FASCIOTOMY;  Surgeon: Chuck Hint, MD;  Location: [facility] OR;  Service: Vascular;  Laterality: Left;  . finger surgery Right    small finger  (2 different surgeries)  . LOWER EXTREMITY ANGIOGRAM Left 04/27/2019   Procedure: LEFT LOWER  EXTREMITY ANGIOGRAM;  Surgeon: Maeola Harman, MD;  Location: [facility] OR;  Service: Vascular;  Laterality: Left;  . PERIPHERAL VASCULAR BALLOON ANGIOPLASTY Left 05/08/2019   Procedure: PERIPHERAL VASCULAR BALLOON ANGIOPLASTY;  Surgeon: Maeola Harman, MD;  Location: [facility] INVASIVE CV LAB;  Service: Cardiovascular;  Laterality: Left;  Posterior tibial  . PERIPHERAL VASCULAR INTERVENTION Left 04/28/2019   Procedure: PERIPHERAL VASCULAR INTERVENTION;  Surgeon: Cephus Shelling, MD;  Location: [facility] INVASIVE CV LAB;  Service: Cardiovascular;  Laterality: Left;  . PERIPHERAL VASCULAR THROMBECTOMY Bilateral 04/28/2019   Procedure: PERIPHERAL VASCULAR THROMBECTOMY;  Surgeon: Cephus Shelling, MD;  Location: [facility] INVASIVE CV LAB;  Service: Cardiovascular;  Laterality: Bilateral;  . PERIPHERAL VASCULAR THROMBECTOMY Left 05/08/2019   Procedure: PERIPHERAL VASCULAR THROMBECTOMY;  Surgeon: Maeola Harman, MD;  Location: [facility] INVASIVE CV LAB;  Service: Cardiovascular;  Laterality: Left;  posterior tibial  . TRANSMETATARSAL AMPUTATION Left 05/09/2019   Procedure: Left Below Knee Amputation;  Surgeon: Maeola Harman, MD;  Location: [facility] OR;  Service: Vascular;  Laterality: Left;    Social History   Socioeconomic History  . Marital status: Married    Spouse name: Not on file  . Number of children: Not on file  . Years of education: Not on file  . Highest education level: Not on file  Occupational History  . Not on file  Tobacco Use  . Smoking status: Current Every Day Smoker    Packs/day: 1.00    Types: Cigarettes  . Smokeless tobacco: Never Used  Vaping Use  . Vaping Use: Never used  Substance and Sexual  Activity  . Alcohol use: Never  . Drug use: Never  . Sexual activity: Not on file  Other Topics Concern  . Not on file  Social History Narrative  . Not on file   Social Determinants of Health   Financial Resource Strain:   . Difficulty of Paying Living Expenses:  Not on file  Food Insecurity:   . Worried About Programme researcher, broadcasting/film/video in the Last Year: Not on file  . Ran Out of Food in the Last Year: Not on file  Transportation Needs:   . Lack of Transportation (Medical): Not on file  . Lack of Transportation (Non-Medical): Not on file  Physical Activity:   . Days of Exercise per Week: Not on file  . Minutes of Exercise per Session: Not on file  Stress:   . Feeling of Stress : Not on file  Social Connections:   . Frequency of Communication with Friends and Family: Not on file  . Frequency of Social Gatherings with Friends and Family: Not on file  . Attends Religious Services: Not on file  . Active Member of Clubs or Organizations: Not on file  . Attends Banker Meetings: Not on file  . Marital Status: Not on file  Intimate Partner Violence:   . Fear of Current or Ex-Partner: Not on file  . Emotionally Abused: Not on file  . Physically Abused: Not on file  . Sexually Abused: Not on file   Family History  Problem Relation Age of Onset  . Diabetes Father     Current Outpatient Medications  Medication Sig Dispense Refill  . acetaminophen (TYLENOL) 500 MG tablet Take 1,000 mg by mouth every 6 (six) hours as needed.    . Adalimumab (HUMIRA PEN) 40 MG/0.4ML PNKT Inject 40 mg into the skin every 14 (fourteen) days.    Marland Kitchen amLODipine (NORVASC) 10 MG tablet Take 10 mg by mouth daily.    Marland Kitchen aspirin EC 81 MG tablet Take 81 mg by mouth daily.    . Buprenorphine HCl (BELBUCA) 300 MCG FILM Place inside cheek.    . clopidogrel (PLAVIX) 75 MG tablet Take 1 tablet (75 mg total) by mouth daily with breakfast. 30 tablet 2  . DULoxetine (CYMBALTA) 30 MG capsule Take 30 mg by mouth daily.     Marland Kitchen lisinopril-hydrochlorothiazide (ZESTORETIC) 20-12.5 MG tablet Take 2 tablets by mouth daily.     . potassium chloride SA (KLOR-CON) 20 MEQ tablet Take 20 mEq by mouth daily.    . pregabalin (LYRICA) 100 MG capsule Take 100 mg by mouth 2 (two) times daily.    .  rosuvastatin (CRESTOR) 10 MG tablet TAKE 1 TABLET BY MOUTH EVERY DAY 90 tablet 3  . sildenafil (REVATIO) 20 MG tablet Take 80 mg by mouth as needed (sexual activity).      No current facility-administered medications for this visit.    No Known Allergies   REVIEW OF SYSTEMS:   [X]  denotes positive finding, [ ]  denotes negative finding Cardiac  Comments:  Chest pain or chest pressure:    Shortness of breath upon exertion:    Short of breath when lying flat:    Irregular heart rhythm:        Vascular    Pain in calf, thigh, or hip brought on by ambulation:    Pain in feet at night that wakes you up from your sleep:     Blood clot in your veins:    Leg swelling:  Pulmonary    Oxygen at home:    Productive cough:     Wheezing:         Neurologic    Sudden weakness in arms or legs:     Sudden numbness in arms or legs:     Sudden onset of difficulty speaking or slurred speech:    Temporary loss of vision in one eye:     Problems with dizziness:         Gastrointestinal    Blood in stool:     Vomited blood:         Genitourinary    Burning when urinating:     Blood in urine:        Psychiatric    Major depression:         Hematologic    Bleeding problems:    Problems with blood clotting too easily:        Skin    Rashes or ulcers:        Constitutional    Fever or chills:      PHYSICAL EXAMINATION:  There were no vitals filed for this visit.  General:  WDWN in NAD; vital signs documented above Gait: Not observed HENT: WNL, normocephalic Pulmonary: normal non-labored breathing , without Rales, rhonchi,  wheezing Cardiac: regular HR, without  Murmurs without carotid bruit Skin: without rashes Vascular Exam/Pulses: 2+ brachial, radial pulses bilaterally.  I cannot palpate his pedal pulses. Extremities: without ischemic changes, without Gangrene , without cellulitis; without open wounds; right foot is warm.  Nailbeds are dusky. Musculoskeletal: no  muscle wasting or atrophy  Neurologic: A&O X 3;  No focal weakness or paresthesias are detected Psychiatric:  The pt has Normal affect.   Non-Invasive Vascular Imaging:   06/07/2020 ABI/TBIToday's ABIToday's TBIPrevious ABIPrevious TBI  +-------+-----------+-----------+------------+------------+  Right 0.72    0.26    0.67    0        +-------+-----------+-----------+------------+------------+  Left  AKA                        +-------+-----------+-----------+------------+------------+ Great toe pressure 36 No dorsalis pedis signal.  Posterior tibial artery signal biphasic   ASSESSMENT/PLAN:: 49 y.o. male here for follow up for peripheral arterial disease.  Mild improvement in right ABI.  Toe pressure now 36.  Advised him to monitor right lower extremity pain and to call us for increasing discomfort or rest pain.  Prior to getting fitted for his prosthesis, he was hopping with rolling walker.  I believe his right lower leg pain at this point is likely fatigue but we need to monitor.  Recheck in 6 months with ABIs.  Continue Plavix aspirin and statin   Milinda Antis, PA-C [facility] [phone]  Clinic MD:   Randie Heinz

## 2020-11-23 ENCOUNTER — Other Ambulatory Visit: Payer: Self-pay

## 2020-11-23 DIAGNOSIS — I779 Disorder of arteries and arterioles, unspecified: Secondary | ICD-10-CM

## 2020-12-05 ENCOUNTER — Ambulatory Visit: Payer: BLUE CROSS/BLUE SHIELD | Admitting: Physician Assistant

## 2020-12-05 ENCOUNTER — Other Ambulatory Visit: Payer: Self-pay

## 2020-12-05 ENCOUNTER — Ambulatory Visit (HOSPITAL_COMMUNITY)
Admission: RE | Admit: 2020-12-05 | Discharge: 2020-12-05 | Disposition: A | Discharge status: Home / Self Care | Payer: BLUE CROSS/BLUE SHIELD | Source: Ambulatory Visit | Attending: Vascular Surgery | Admitting: Vascular Surgery

## 2020-12-05 VITALS — BP 132/94 | HR 80 | Temp 98.2°F | Resp 20 | Ht 77.0 in | Wt 304.9 lb

## 2020-12-05 DIAGNOSIS — I779 Disorder of arteries and arterioles, unspecified: Secondary | ICD-10-CM | POA: Diagnosis present

## 2020-12-05 DIAGNOSIS — Z23 Encounter for immunization: Secondary | ICD-10-CM

## 2020-12-05 DIAGNOSIS — T879 Unspecified complications of amputation stump: Secondary | ICD-10-CM | POA: Diagnosis not present

## 2020-12-05 DIAGNOSIS — Z01810 Encounter for preprocedural cardiovascular examination: Secondary | ICD-10-CM | POA: Insufficient documentation

## 2020-12-05 DIAGNOSIS — Z79899 Other long term (current) drug therapy: Secondary | ICD-10-CM

## 2020-12-05 DIAGNOSIS — B356 Tinea cruris: Secondary | ICD-10-CM | POA: Insufficient documentation

## 2020-12-05 HISTORY — DX: Encounter for immunization: Z23

## 2020-12-05 HISTORY — DX: Other long term (current) drug therapy: Z79.899

## 2020-12-05 NOTE — H&P (View-Only) (Signed)
Office Note     CC:  follow up Requesting Provider:  Health, [facility]*  HPI: Sean [name] is a 50 y.o. (July 31, 1971) male who presents for evaluation of PAD.  Surgical history significant for left above-the-knee popliteal to posterior tibial artery bypass with vein and 4 compartment fasciotomies due to acute limb ischemia by Dr. Randie Heinz on 09/21/2018.  He underwent thrombolysis of left lower extremity bypass by Dr. Chestine Spore on 04/28/2019.  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 Randie Heinz on 05/09/2019.  He was converted to a left above-the-knee amputation by Dr. Edilia Bo on 01/30/2020.  He is now ambulatory with a prosthetic and continues to have physical therapy twice a week.  Since becoming ambulatory he is noticing short distance claudication with right calf pain.  He states that this is lifestyle limiting and would like to pursue any procedure that would improve his symptoms.  He denies any rest pain or nonhealing wounds of right lower extremity.  He is on aspirin, Plavix, statin daily.  He is a tobacco smoker.  Past Medical History:  Diagnosis Date  . Hypertension   . PVD (peripheral vascular disease) (HCC)     Past Surgical History:  Procedure Laterality Date  . ABDOMINAL AORTOGRAM W/LOWER EXTREMITY N/A 05/08/2019   Procedure: ABDOMINAL AORTOGRAM W/LOWER EXTREMITY;  Surgeon: Maeola Harman, MD;  Location: [facility] INVASIVE CV LAB;  Service: Cardiovascular;  Laterality: N/A;  . AMPUTATION Left 01/30/2020   Procedure: LEFT ABOVE KNEE AMPUTATION;  Surgeon: Chuck Hint, MD;  Location: [facility] OR;  Service: Vascular;  Laterality: Left;  . ANGIOPLASTY Left 04/27/2019   Procedure: Balloon Angioplasty of left posterior tibial artery;  Surgeon: Maeola Harman, MD;  Location: [facility] OR;  Service: Vascular;  Laterality: Left;  . EMBOLECTOMY Left 09/21/2018   Procedure: THROMBECTOMY LEFT POPLITEAL ARTERY BYPASS LEFT POPLITEAL TO TIBIAL ARTERY WITH SAPHENOUS VEIN HARVEST -  FASCIOTOMY;  Surgeon: Chuck Hint, MD;  Location: [facility] OR;  Service: Vascular;  Laterality: Left;  . finger surgery Right    small finger  (2 different surgeries)  . LOWER EXTREMITY ANGIOGRAM Left 04/27/2019   Procedure: LEFT LOWER EXTREMITY ANGIOGRAM;  Surgeon: Maeola Harman, MD;  Location: [facility] OR;  Service: Vascular;  Laterality: Left;  . PERIPHERAL VASCULAR BALLOON ANGIOPLASTY Left 05/08/2019   Procedure: PERIPHERAL VASCULAR BALLOON ANGIOPLASTY;  Surgeon: Maeola Harman, MD;  Location: [facility] INVASIVE CV LAB;  Service: Cardiovascular;  Laterality: Left;  Posterior tibial  . PERIPHERAL VASCULAR INTERVENTION Left 04/28/2019   Procedure: PERIPHERAL VASCULAR INTERVENTION;  Surgeon: Cephus Shelling, MD;  Location: [facility] INVASIVE CV LAB;  Service: Cardiovascular;  Laterality: Left;  . PERIPHERAL VASCULAR THROMBECTOMY Bilateral 04/28/2019   Procedure: PERIPHERAL VASCULAR THROMBECTOMY;  Surgeon: Cephus Shelling, MD;  Location: [facility] INVASIVE CV LAB;  Service: Cardiovascular;  Laterality: Bilateral;  . PERIPHERAL VASCULAR THROMBECTOMY Left 05/08/2019   Procedure: PERIPHERAL VASCULAR THROMBECTOMY;  Surgeon: Maeola Harman, MD;  Location: [facility] INVASIVE CV LAB;  Service: Cardiovascular;  Laterality: Left;  posterior tibial  . TRANSMETATARSAL AMPUTATION Left 05/09/2019   Procedure: Left Below Knee Amputation;  Surgeon: Maeola Harman, MD;  Location: [facility] OR;  Service: Vascular;  Laterality: Left;    Social History   Socioeconomic History  . Marital status: Married    Spouse name: Not on file  . Number of children: Not on file  . Years of education: Not on file  . Highest education level: Not on file  Occupational History  .  Not on file  Tobacco Use  . Smoking status: Current Every Day Smoker    Packs/day: 1.00    Types: Cigarettes  . Smokeless tobacco: Never Used  Vaping Use  . Vaping Use: Never used  Substance and Sexual Activity  . Alcohol use: Never   . Drug use: Never  . Sexual activity: Not on file  Other Topics Concern  . Not on file  Social History Narrative  . Not on file   Social Determinants of Health   Financial Resource Strain: Not on file  Food Insecurity: Not on file  Transportation Needs: Not on file  Physical Activity: Not on file  Stress: Not on file  Social Connections: Not on file  Intimate Partner Violence: Not on file    Family History  Problem Relation Age of Onset  . Diabetes Father     Current Outpatient Medications  Medication Sig Dispense Refill  . Adalimumab (HUMIRA PEN) 40 MG/0.4ML PNKT Inject 40 mg into the skin every 14 (fourteen) days.    Marland Kitchen amLODipine (NORVASC) 10 MG tablet Take 10 mg by mouth daily.    Marland Kitchen aspirin EC 81 MG tablet Take 81 mg by mouth daily.    . Buprenorphine HCl (BELBUCA) 300 MCG FILM Place inside cheek.    . clobetasol ointment (TEMOVATE) 0.05 % SMARTSIG:1 Topical Every Night    . clopidogrel (PLAVIX) 75 MG tablet Take 1 tablet (75 mg total) by mouth daily with breakfast. 30 tablet 2  . DULoxetine (CYMBALTA) 30 MG capsule Take 30 mg by mouth daily.     Marland Kitchen lisinopril-hydrochlorothiazide (ZESTORETIC) 20-12.5 MG tablet Take 2 tablets by mouth daily.     . potassium chloride SA (KLOR-CON) 20 MEQ tablet Take 20 mEq by mouth daily.    . pregabalin (LYRICA) 100 MG capsule Take 100 mg by mouth 2 (two) times daily.    . rosuvastatin (CRESTOR) 10 MG tablet TAKE 1 TABLET BY MOUTH EVERY DAY 90 tablet 3  . acetaminophen (TYLENOL) 500 MG tablet Take 1,000 mg by mouth every 6 (six) hours as needed. (Patient not taking: Reported on 12/05/2020)    . phentermine (ADIPEX-P) 37.5 MG tablet Take by mouth.    . sildenafil (REVATIO) 20 MG tablet Take 80 mg by mouth as needed (sexual activity).  (Patient not taking: Reported on 12/05/2020)     No current facility-administered medications for this visit.    No Known Allergies   REVIEW OF SYSTEMS:   [X]  denotes positive finding, [ ]  denotes  negative finding Cardiac  Comments:  Chest pain or chest pressure:    Shortness of breath upon exertion:    Short of breath when lying flat:    Irregular heart rhythm:        Vascular    Pain in calf, thigh, or hip brought on by ambulation:    Pain in feet at night that wakes you up from your sleep:     Blood clot in your veins:    Leg swelling:         Pulmonary    Oxygen at home:    Productive cough:     Wheezing:         Neurologic    Sudden weakness in arms or legs:     Sudden numbness in arms or legs:     Sudden onset of difficulty speaking or slurred speech:    Temporary loss of vision in one eye:     Problems with dizziness:  Gastrointestinal    Blood in stool:     Vomited blood:         Genitourinary    Burning when urinating:     Blood in urine:        Psychiatric    Major depression:         Hematologic    Bleeding problems:    Problems with blood clotting too easily:        Skin    Rashes or ulcers:        Constitutional    Fever or chills:      PHYSICAL EXAMINATION:  Vitals:   12/05/20 1019  BP: (!) 132/94  Pulse: 80  Resp: 20  Temp: 98.2 F (36.8 C)  TempSrc: Temporal  SpO2: 93%  Weight: (!) 304 lb 14.4 oz (138.3 kg)  Height: 6' 5" (1.956 m)    General:  WDWN in NAD; vital signs documented above Gait: Not observed HENT: WNL, normocephalic Pulmonary: normal non-labored breathing  Cardiac: regular HR Abdomen: soft, NT, no masses Skin: without rashes Vascular Exam/Pulses:  Right Left  Femoral 2+ (normal) 2+ (normal)  DP absent AKA  PT absent AKA   Extremities: without ischemic changes, without Gangrene , without cellulitis; without open wounds;  Musculoskeletal: no muscle wasting or atrophy  Neurologic: A&O X 3;  No focal weakness or paresthesias are detected Psychiatric:  The pt has Normal affect.   Non-Invasive Vascular Imaging:   ABI/TBIToday's ABIToday's TBIPrevious ABIPrevious TBI   +-------+-----------+-----------+------------+------------+  Right 0.61    absent   0.72    0.26      +-------+-----------+-----------+------------+------------+    ASSESSMENT/PLAN:: 49 y.o. male with PAD.  History of left lower extremity bypass eventually requiring amputation  -Left AKA well-healed and patient is ambulatory with a prosthetic -He is complaining of lifestyle limiting claudication of right lower extremity with known infrainguinal occlusive disease and ABI today of 0.6 with an absent TBI -Case was discussed with Dr. Cain and we will proceed with aortogram and right lower extremity arteriogram for possible intervention -Patient is aware of the risks and is willing to proceed with the above -He will continue his aspirin and Plavix perioperatively  Vantasia Pinkney, PA-C [facility] [phone]  Clinic MD:   [place]  

## 2020-12-05 NOTE — Progress Notes (Signed)
Office Note     CC:  follow up Requesting Provider:  [facility], [facility]  HPI: Sean [name] is a 50 y.o. (July 31, 1971) male who presents for evaluation of PAD.  Surgical history significant for left above-the-knee popliteal to posterior tibial artery bypass with vein and 4 compartment fasciotomies due to acute limb ischemia by Dr. Randie Heinz on 09/21/2018.  He underwent thrombolysis of left lower extremity bypass by Dr. Chestine Spore on 04/28/2019.  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 Randie Heinz on 05/09/2019.  He was converted to a left above-the-knee amputation by Dr. Edilia Bo on 01/30/2020.  He is now ambulatory with a prosthetic and continues to have physical therapy twice a week.  Since becoming ambulatory he is noticing short distance claudication with right calf pain.  He states that this is lifestyle limiting and would like to pursue any procedure that would improve his symptoms.  He denies any rest pain or nonhealing wounds of right lower extremity.  He is on aspirin, Plavix, statin daily.  He is a tobacco smoker.  Past Medical History:  Diagnosis Date  . Hypertension   . PVD (peripheral vascular disease) (HCC)     Past Surgical History:  Procedure Laterality Date  . ABDOMINAL AORTOGRAM W/LOWER EXTREMITY N/A 05/08/2019   Procedure: ABDOMINAL AORTOGRAM W/LOWER EXTREMITY;  Surgeon: Maeola Harman, MD;  Location: [facility] INVASIVE CV LAB;  Service: Cardiovascular;  Laterality: N/A;  . AMPUTATION Left 01/30/2020   Procedure: LEFT ABOVE KNEE AMPUTATION;  Surgeon: Chuck Hint, MD;  Location: [facility] OR;  Service: Vascular;  Laterality: Left;  . ANGIOPLASTY Left 04/27/2019   Procedure: Balloon Angioplasty of left posterior tibial artery;  Surgeon: Maeola Harman, MD;  Location: [facility] OR;  Service: Vascular;  Laterality: Left;  . EMBOLECTOMY Left 09/21/2018   Procedure: THROMBECTOMY LEFT POPLITEAL ARTERY BYPASS LEFT POPLITEAL TO TIBIAL ARTERY WITH SAPHENOUS VEIN HARVEST -  FASCIOTOMY;  Surgeon: Chuck Hint, MD;  Location: [facility] OR;  Service: Vascular;  Laterality: Left;  . finger surgery Right    small finger  (2 different surgeries)  . LOWER EXTREMITY ANGIOGRAM Left 04/27/2019   Procedure: LEFT LOWER EXTREMITY ANGIOGRAM;  Surgeon: Maeola Harman, MD;  Location: [facility] OR;  Service: Vascular;  Laterality: Left;  . PERIPHERAL VASCULAR BALLOON ANGIOPLASTY Left 05/08/2019   Procedure: PERIPHERAL VASCULAR BALLOON ANGIOPLASTY;  Surgeon: Maeola Harman, MD;  Location: [facility] INVASIVE CV LAB;  Service: Cardiovascular;  Laterality: Left;  Posterior tibial  . PERIPHERAL VASCULAR INTERVENTION Left 04/28/2019   Procedure: PERIPHERAL VASCULAR INTERVENTION;  Surgeon: Cephus Shelling, MD;  Location: [facility] INVASIVE CV LAB;  Service: Cardiovascular;  Laterality: Left;  . PERIPHERAL VASCULAR THROMBECTOMY Bilateral 04/28/2019   Procedure: PERIPHERAL VASCULAR THROMBECTOMY;  Surgeon: Cephus Shelling, MD;  Location: [facility] INVASIVE CV LAB;  Service: Cardiovascular;  Laterality: Bilateral;  . PERIPHERAL VASCULAR THROMBECTOMY Left 05/08/2019   Procedure: PERIPHERAL VASCULAR THROMBECTOMY;  Surgeon: Maeola Harman, MD;  Location: [facility] INVASIVE CV LAB;  Service: Cardiovascular;  Laterality: Left;  posterior tibial  . TRANSMETATARSAL AMPUTATION Left 05/09/2019   Procedure: Left Below Knee Amputation;  Surgeon: Maeola Harman, MD;  Location: [facility] OR;  Service: Vascular;  Laterality: Left;    Social History   Socioeconomic History  . Marital status: Married    Spouse name: Not on file  . Number of children: Not on file  . Years of education: Not on file  . Highest education level: Not on file  Occupational History  .  Not on file  Tobacco Use  . Smoking status: Current Every Day Smoker    Packs/day: 1.00    Types: Cigarettes  . Smokeless tobacco: Never Used  Vaping Use  . Vaping Use: Never used  Substance and Sexual Activity  . Alcohol use: Never   . Drug use: Never  . Sexual activity: Not on file  Other Topics Concern  . Not on file  Social History Narrative  . Not on file   Social Determinants of Health   Financial Resource Strain: Not on file  Food Insecurity: Not on file  Transportation Needs: Not on file  Physical Activity: Not on file  Stress: Not on file  Social Connections: Not on file  Intimate Partner Violence: Not on file    Family History  Problem Relation Age of Onset  . Diabetes Father     Current Outpatient Medications  Medication Sig Dispense Refill  . Adalimumab (HUMIRA PEN) 40 MG/0.4ML PNKT Inject 40 mg into the skin every 14 (fourteen) days.    Marland Kitchen amLODipine (NORVASC) 10 MG tablet Take 10 mg by mouth daily.    Marland Kitchen aspirin EC 81 MG tablet Take 81 mg by mouth daily.    . Buprenorphine HCl (BELBUCA) 300 MCG FILM Place inside cheek.    . clobetasol ointment (TEMOVATE) 0.05 % SMARTSIG:1 Topical Every Night    . clopidogrel (PLAVIX) 75 MG tablet Take 1 tablet (75 mg total) by mouth daily with breakfast. 30 tablet 2  . DULoxetine (CYMBALTA) 30 MG capsule Take 30 mg by mouth daily.     Marland Kitchen lisinopril-hydrochlorothiazide (ZESTORETIC) 20-12.5 MG tablet Take 2 tablets by mouth daily.     . potassium chloride SA (KLOR-CON) 20 MEQ tablet Take 20 mEq by mouth daily.    . pregabalin (LYRICA) 100 MG capsule Take 100 mg by mouth 2 (two) times daily.    . rosuvastatin (CRESTOR) 10 MG tablet TAKE 1 TABLET BY MOUTH EVERY DAY 90 tablet 3  . acetaminophen (TYLENOL) 500 MG tablet Take 1,000 mg by mouth every 6 (six) hours as needed. (Patient not taking: Reported on 12/05/2020)    . phentermine (ADIPEX-P) 37.5 MG tablet Take by mouth.    . sildenafil (REVATIO) 20 MG tablet Take 80 mg by mouth as needed (sexual activity).  (Patient not taking: Reported on 12/05/2020)     No current facility-administered medications for this visit.    No Known Allergies   REVIEW OF SYSTEMS:   [X]  denotes positive finding, [ ]  denotes  negative finding Cardiac  Comments:  Chest pain or chest pressure:    Shortness of breath upon exertion:    Short of breath when lying flat:    Irregular heart rhythm:        Vascular    Pain in calf, thigh, or hip brought on by ambulation:    Pain in feet at night that wakes you up from your sleep:     Blood clot in your veins:    Leg swelling:         Pulmonary    Oxygen at home:    Productive cough:     Wheezing:         Neurologic    Sudden weakness in arms or legs:     Sudden numbness in arms or legs:     Sudden onset of difficulty speaking or slurred speech:    Temporary loss of vision in one eye:     Problems with dizziness:  Gastrointestinal    Blood in stool:     Vomited blood:         Genitourinary    Burning when urinating:     Blood in urine:        Psychiatric    Major depression:         Hematologic    Bleeding problems:    Problems with blood clotting too easily:        Skin    Rashes or ulcers:        Constitutional    Fever or chills:      PHYSICAL EXAMINATION:  Vitals:   12/05/20 1019  BP: (!) 132/94  Pulse: 80  Resp: 20  Temp: 98.2 F (36.8 C)  TempSrc: Temporal  SpO2: 93%  Weight: (!) 304 lb 14.4 oz (138.3 kg)  Height: 6\' 5"  (1.956 m)    General:  WDWN in NAD; vital signs documented above Gait: Not observed HENT: WNL, normocephalic Pulmonary: normal non-labored breathing  Cardiac: regular HR Abdomen: soft, NT, no masses Skin: without rashes Vascular Exam/Pulses:  Right Left  Femoral 2+ (normal) 2+ (normal)  DP absent AKA  PT absent AKA   Extremities: without ischemic changes, without Gangrene , without cellulitis; without open wounds;  Musculoskeletal: no muscle wasting or atrophy  Neurologic: A&O X 3;  No focal weakness or paresthesias are detected Psychiatric:  The pt has Normal affect.   Non-Invasive Vascular Imaging:   ABI/TBIToday's ABIToday's TBIPrevious ABIPrevious TBI   +-------+-----------+-----------+------------+------------+  Right 0.61    absent   0.72    0.26      +-------+-----------+-----------+------------+------------+    ASSESSMENT/PLAN:: 50 y.o. male with PAD.  History of left lower extremity bypass eventually requiring amputation  -Left AKA well-healed and patient is ambulatory with a prosthetic -He is complaining of lifestyle limiting claudication of right lower extremity with known infrainguinal occlusive disease and ABI today of 0.6 with an absent TBI -Case was discussed with Dr. 44 and we will proceed with aortogram and right lower extremity arteriogram for possible intervention -Patient is aware of the risks and is willing to proceed with the above -He will continue his aspirin and Plavix perioperatively  Randie Heinz, PA-C [facility] [phone]  Clinic MD:   [phone]

## 2020-12-10 DIAGNOSIS — R3915 Urgency of urination: Secondary | ICD-10-CM | POA: Insufficient documentation

## 2020-12-10 DIAGNOSIS — R3912 Poor urinary stream: Secondary | ICD-10-CM

## 2020-12-10 DIAGNOSIS — E66812 Obesity, class 2: Secondary | ICD-10-CM | POA: Insufficient documentation

## 2020-12-10 DIAGNOSIS — F17200 Nicotine dependence, unspecified, uncomplicated: Secondary | ICD-10-CM

## 2020-12-10 HISTORY — DX: Morbid (severe) obesity due to excess calories: E66.01

## 2020-12-10 HISTORY — DX: Morbid (severe) obesity due to excess calories: E66.812

## 2020-12-10 HISTORY — DX: Nicotine dependence, unspecified, uncomplicated: F17.200

## 2020-12-10 HISTORY — DX: Poor urinary stream: R39.12

## 2020-12-20 ENCOUNTER — Other Ambulatory Visit (HOSPITAL_COMMUNITY)
Admission: RE | Admit: 2020-12-20 | Discharge: 2020-12-20 | Disposition: A | Discharge status: Home / Self Care | Payer: BLUE CROSS/BLUE SHIELD | Source: Ambulatory Visit | Attending: Vascular Surgery | Admitting: Vascular Surgery

## 2020-12-20 DIAGNOSIS — Z7982 Long term (current) use of aspirin: Secondary | ICD-10-CM | POA: Diagnosis not present

## 2020-12-20 DIAGNOSIS — Z20822 Contact with and (suspected) exposure to covid-19: Secondary | ICD-10-CM | POA: Insufficient documentation

## 2020-12-20 DIAGNOSIS — Z7902 Long term (current) use of antithrombotics/antiplatelets: Secondary | ICD-10-CM | POA: Diagnosis not present

## 2020-12-20 DIAGNOSIS — Z89612 Acquired absence of left leg above knee: Secondary | ICD-10-CM | POA: Diagnosis not present

## 2020-12-20 DIAGNOSIS — Z79899 Other long term (current) drug therapy: Secondary | ICD-10-CM | POA: Diagnosis not present

## 2020-12-20 DIAGNOSIS — F17210 Nicotine dependence, cigarettes, uncomplicated: Secondary | ICD-10-CM | POA: Diagnosis not present

## 2020-12-20 DIAGNOSIS — Z01812 Encounter for preprocedural laboratory examination: Secondary | ICD-10-CM | POA: Insufficient documentation

## 2020-12-20 DIAGNOSIS — I10 Essential (primary) hypertension: Secondary | ICD-10-CM | POA: Diagnosis not present

## 2020-12-20 DIAGNOSIS — I70211 Atherosclerosis of native arteries of extremities with intermittent claudication, right leg: Secondary | ICD-10-CM | POA: Diagnosis present

## 2020-12-21 LAB — SARS CORONAVIRUS 2 (TAT 6-24 HRS): SARS Coronavirus 2: NEGATIVE

## 2020-12-23 ENCOUNTER — Ambulatory Visit (HOSPITAL_COMMUNITY)
Admission: RE | Admit: 2020-12-23 | Discharge: 2020-12-23 | Disposition: A | Discharge status: Home / Self Care | Payer: BLUE CROSS/BLUE SHIELD | Attending: Vascular Surgery | Admitting: Vascular Surgery

## 2020-12-23 ENCOUNTER — Other Ambulatory Visit: Payer: Self-pay

## 2020-12-23 ENCOUNTER — Encounter (HOSPITAL_COMMUNITY)
Admission: RE | Disposition: A | Discharge status: Home / Self Care | Payer: Self-pay | Source: Home / Self Care | Attending: Vascular Surgery

## 2020-12-23 DIAGNOSIS — I70211 Atherosclerosis of native arteries of extremities with intermittent claudication, right leg: Secondary | ICD-10-CM | POA: Insufficient documentation

## 2020-12-23 DIAGNOSIS — Z7982 Long term (current) use of aspirin: Secondary | ICD-10-CM | POA: Insufficient documentation

## 2020-12-23 DIAGNOSIS — F17210 Nicotine dependence, cigarettes, uncomplicated: Secondary | ICD-10-CM | POA: Insufficient documentation

## 2020-12-23 DIAGNOSIS — I10 Essential (primary) hypertension: Secondary | ICD-10-CM | POA: Insufficient documentation

## 2020-12-23 DIAGNOSIS — Z20822 Contact with and (suspected) exposure to covid-19: Secondary | ICD-10-CM | POA: Insufficient documentation

## 2020-12-23 DIAGNOSIS — Z7902 Long term (current) use of antithrombotics/antiplatelets: Secondary | ICD-10-CM | POA: Insufficient documentation

## 2020-12-23 DIAGNOSIS — Z89612 Acquired absence of left leg above knee: Secondary | ICD-10-CM | POA: Insufficient documentation

## 2020-12-23 DIAGNOSIS — Z79899 Other long term (current) drug therapy: Secondary | ICD-10-CM | POA: Insufficient documentation

## 2020-12-23 HISTORY — PX: ABDOMINAL AORTOGRAM W/LOWER EXTREMITY: CATH118223

## 2020-12-23 LAB — POCT I-STAT, CHEM 8
BUN: 9 mg/dL (ref 6–20)
Calcium, Ion: 1.28 mmol/L (ref 1.15–1.40)
Chloride: 103 mmol/L (ref 98–111)
Creatinine, Ser: 0.8 mg/dL (ref 0.61–1.24)
Glucose, Bld: 127 mg/dL — ABNORMAL HIGH (ref 70–99)
HCT: 45 % (ref 39.0–52.0)
Hemoglobin: 15.3 g/dL (ref 13.0–17.0)
Potassium: 3.5 mmol/L (ref 3.5–5.1)
Sodium: 143 mmol/L (ref 135–145)
TCO2: 25 mmol/L (ref 22–32)

## 2020-12-23 SURGERY — ABDOMINAL AORTOGRAM W/LOWER EXTREMITY
Anesthesia: LOCAL

## 2020-12-23 MED ORDER — SODIUM CHLORIDE 0.9 % IV SOLN: INTRAVENOUS | Status: DC

## 2020-12-23 MED ORDER — HEPARIN (PORCINE) IN NACL 1000-0.9 UT/500ML-% IV SOLN
INTRAVENOUS | Status: DC | PRN
  Administered 2020-12-23 (×2): 500 mL

## 2020-12-23 MED ORDER — IODIXANOL 320 MG/ML IV SOLN
INTRAVENOUS | Status: DC | PRN
  Administered 2020-12-23: 55 mL via INTRA_ARTERIAL

## 2020-12-23 MED ORDER — ONDANSETRON HCL 4 MG/2ML IJ SOLN: 4.0000 mg | Freq: Four times a day (QID) | INTRAMUSCULAR | Status: DC | PRN

## 2020-12-23 MED ORDER — SODIUM CHLORIDE 0.9 % IV SOLN: 250.0000 mL | INTRAVENOUS | Status: DC | PRN

## 2020-12-23 MED ORDER — LIDOCAINE HCL (PF) 1 % IJ SOLN
INTRAMUSCULAR | Status: AC
  Filled 2020-12-23: qty 30

## 2020-12-23 MED ORDER — FENTANYL CITRATE (PF) 100 MCG/2ML IJ SOLN
INTRAMUSCULAR | Status: AC
  Filled 2020-12-23: qty 2

## 2020-12-23 MED ORDER — ACETAMINOPHEN 325 MG PO TABS: 650.0000 mg | ORAL_TABLET | ORAL | Status: DC | PRN

## 2020-12-23 MED ORDER — OXYCODONE HCL 5 MG PO TABS: 5.0000 mg | ORAL_TABLET | ORAL | Status: DC | PRN

## 2020-12-23 MED ORDER — MIDAZOLAM HCL 2 MG/2ML IJ SOLN
INTRAMUSCULAR | Status: DC | PRN
  Administered 2020-12-23: 2 mg via INTRAVENOUS

## 2020-12-23 MED ORDER — SODIUM CHLORIDE 0.9% FLUSH: 3.0000 mL | INTRAVENOUS | Status: DC | PRN

## 2020-12-23 MED ORDER — SODIUM CHLORIDE 0.9% FLUSH: 3.0000 mL | Freq: Two times a day (BID) | INTRAVENOUS | Status: DC

## 2020-12-23 MED ORDER — LABETALOL HCL 5 MG/ML IV SOLN: 10.0000 mg | INTRAVENOUS | Status: DC | PRN

## 2020-12-23 MED ORDER — LABETALOL HCL 5 MG/ML IV SOLN
INTRAVENOUS | Status: AC
  Administered 2020-12-23: 10 mg via INTRAVENOUS
  Filled 2020-12-23: qty 4

## 2020-12-23 MED ORDER — HYDRALAZINE HCL 20 MG/ML IJ SOLN: 5.0000 mg | INTRAMUSCULAR | Status: DC | PRN

## 2020-12-23 MED ORDER — LIDOCAINE HCL (PF) 1 % IJ SOLN
INTRAMUSCULAR | Status: DC | PRN
  Administered 2020-12-23: 15 mL via INTRADERMAL

## 2020-12-23 MED ORDER — MIDAZOLAM HCL 2 MG/2ML IJ SOLN
INTRAMUSCULAR | Status: AC
  Filled 2020-12-23: qty 2

## 2020-12-23 MED ORDER — HEPARIN (PORCINE) IN NACL 1000-0.9 UT/500ML-% IV SOLN
INTRAVENOUS | Status: AC
  Filled 2020-12-23: qty 1000

## 2020-12-23 MED ORDER — FENTANYL CITRATE (PF) 100 MCG/2ML IJ SOLN
INTRAMUSCULAR | Status: DC | PRN
  Administered 2020-12-23: 50 ug via INTRAVENOUS

## 2020-12-23 SURGICAL SUPPLY — 10 items
CATH OMNI FLUSH 5F 65CM (CATHETERS) ×2 IMPLANT
CLOSURE MYNX CONTROL 5F (Vascular Products) ×2 IMPLANT
KIT MICROPUNCTURE NIT STIFF (SHEATH) ×2 IMPLANT
KIT PV (KITS) ×2 IMPLANT
SHEATH PINNACLE 5F 10CM (SHEATH) ×2 IMPLANT
SHEATH PROBE COVER 6X72 (BAG) ×2 IMPLANT
SYR MEDRAD MARK 7 150ML (SYRINGE) ×2 IMPLANT
TRANSDUCER W/STOPCOCK (MISCELLANEOUS) ×2 IMPLANT
TRAY PV CATH (CUSTOM PROCEDURE TRAY) ×2 IMPLANT
WIRE STARTER BENTSON 035X150 (WIRE) ×2 IMPLANT

## 2020-12-23 NOTE — Op Note (Signed)
    Patient name: Sean [name] MRN: [id number] DOB: Aug 12, 1971 Sex: male  12/23/2020 Pre-operative Diagnosis: Right lower extremity claudication Post-operative diagnosis:  Same Surgeon:  Apolinar Junes C. Randie Heinz, MD Procedure Performed: 1.  Ultrasound-guided cannulation left common femoral artery 2.  Aortogram 3.  Selection of right common femoral artery and right lower extremity angiogram 4.  Mynx device closure left common femoral artery 5.  Moderate sedation with fentanyl and Versed for 19 minutes   Indications: 50 year old male with history of left above-knee amputation.  He now has short distance claudication on the right and he walks with the help of a prosthesis.  He is indicated for angiography with possible intervention.  Findings: The aorta and iliac arteries were patent without any flow-limiting stenosis.  Bilateral renal arteries patent no stenosis.  Left common femoral artery by ultrasound was free of disease.  Right common femoral SFA were patent with no stenosis.  The SFA was actually quite large measuring about 7 mm.  Popliteal artery occluded at the knee there were multiple collaterals did reconstituted distal tibial peroneal trunk with runoff via the peroneal and posterior tibial artery to the foot.  With the above findings the patient is young age and his symptoms of claudication I elected for no intervention.  We will plan for right SFA to TP trunk bypass.  Closure device was placed without incidence.   Procedure:  The patient was identified in the holding area and taken to room 8.  The patient was then placed supine on the table and prepped and draped in the usual sterile fashion.  A time out was called.  Ultrasound was used to evaluate the left common femoral artery.  The artery was free of disease.  The areas anesthetized 1% lidocaine cannulated with micropuncture needle followed wire sheath with direct ultrasound visualization.  An image saved the permanent record.  Bentson wires  placed followed by 5 [place] sheath.  Omni catheter was placed to the level of L1 aortogram performed.  We crossed bifurcation with Omni catheter and Bentson wire to the level the common femoral artery performed right lower extremity angiogram.  With the above findings we elected no intervention.  The catheter was removed with a Bentson wire.  Minx device closure was deployed which he tolerated well and did work well.  Patient will be planned for right lower extremity bypass.  Contrast: 55 cc   Kory Panjwani C. Randie Heinz, MD [facility] [place] Office: [phone] Pager: [phone]

## 2020-12-23 NOTE — Interval H&P Note (Signed)
History and Physical Interval Note:  12/23/2020 9:59 AM  Sean [name]  has presented today for surgery, with the diagnosis of PAD.  The various methods of treatment have been discussed with the patient and family. After consideration of risks, benefits and other options for treatment, the patient has consented to  Procedure(s): ABDOMINAL AORTOGRAM W/LOWER EXTREMITY (N/A) as a surgical intervention.  The patient's history has been reviewed, patient examined, no change in status, stable for surgery.  I have reviewed the patient's chart and labs.  Questions were answered to the patient's satisfaction.     Lemar Livings

## 2020-12-23 NOTE — Discharge Instructions (Signed)
Angiogram, Care After This sheet gives you information about how to care for yourself after your procedure. Your health care provider may also give you more specific instructions. If you have problems or questions, contact your health care provider. What can I expect after the procedure? After the procedure, it is common to have:  Bruising and tenderness at the catheter insertion area.  A collection of blood (hematoma) at the insertion area. This may feel like a small lump under the skin at the insertion site. Follow these instructions at home: Insertion site care  Follow instructions from your health care provider about how to take care of your insertion site. Make sure you: ? Wash your hands with soap and water before and after you change your bandage (dressing). If soap and water are not available, use hand sanitizer. ? Change your dressing as told by your health care provider.  Do not take baths, swim, or use a hot tub until your health care provider approves.  You may shower 24-48 hours after the procedure, or as told by your health care provider. To clean the insertion site: ? Gently wash the area with plain soap and water. ? Pat the area dry with a clean towel. ? Do not rub the site. This may cause bleeding.  Check your insertion site every day for signs of infection. Check for: ? Redness, swelling, or pain. ? Fluid or blood. ? Warmth. ? Pus or a bad smell.  Do not apply powder or lotion to the site. Keep the site clean and dry.   Activity  Do not drive for 24 hours if you were given a sedative during your procedure.  Rest as told by your health care provider, usually for 1-2 days.  Do not lift anything that is heavier than 10 lb (4.5 kg), or the limit that you are told, until your health care provider says that it is safe.  If the insertion site was in your leg, try to avoid stairs for a few days.  Return to your normal activities as told by your health care provider,  usually in about a week. Ask your health care provider what activities are safe for you. General instructions  If your insertion site starts bleeding, lie flat and put pressure on the site. If the bleeding does not stop, get help right away. This is a medical emergency.  Take over-the-counter and prescription medicines only as told by your health care provider.  Drink enough fluid to keep your urine pale yellow. This helps flush the contrast dye from your body.  Keep all follow-up visits as told by your health care provider. This is important.   Contact a health care provider if:  You have a fever or chills.  You have redness, swelling, or pain around your insertion site.  You have fluid or blood coming from your insertion site.  Your insertion site feels warm to the touch.  You have pus or a bad smell coming from your insertion site.  You have more bruising around the insertion site. Get help right away if you have:  A problem with the insertion area, such as: ? The area swells fast or bleeds even after you apply pressure. ? The area becomes pale, cool, tingly, or numb.  Chest pain.  Trouble breathing.  A rash.  Any symptoms of a stroke. "BE FAST" is an easy way to remember the main warning signs of a stroke: ? B - Balance. Signs are dizziness, sudden trouble walking,   or loss of balance. ? E - Eyes. Signs are trouble seeing or a sudden change in vision. ? F - Face. Signs are sudden weakness or loss of feeling of the face, or the face or eyelid drooping on one side. ? A - Arms. Signs are weakness or loss of feeling in an arm. This happens suddenly and usually on one side of the body. ? S - Speech. Signs are sudden trouble speaking, slurred speech, or trouble understanding what people say. ? T - Time. Time to call emergency services. Write down what time symptoms started.  You have other signs of a stroke, such as: ? A sudden, severe headache with no known cause. ? Nausea  or vomiting. ? Seizure. These symptoms may represent a serious problem that is an emergency. Do not wait to see if the symptoms will go away. Get medical help right away. Call your local emergency services (911 in the [place] Do not drive yourself to the hospital. Summary  It is common to have bruising and tenderness at the catheter insertion area.  Do not take baths, swim, or use a hot tub until your health care provider approves. You may shower 24-48 hours after the procedure or as told.  It is important to rest and drink plenty of fluids.  If the insertion site bleeds, lie flat and put pressure on the site. If the bleeding continues, get help right away. This is a medical emergency. This information is not intended to replace advice given to you by your health care provider. Make sure you discuss any questions you have with your health care provider. Document Revised: 06/14/2019 Document Reviewed: 06/14/2019 Elsevier Patient Education  2021 [employer]  

## 2020-12-24 ENCOUNTER — Encounter (HOSPITAL_COMMUNITY): Payer: Self-pay | Admitting: Vascular Surgery

## 2020-12-30 ENCOUNTER — Other Ambulatory Visit: Payer: Self-pay

## 2021-01-14 DIAGNOSIS — I10 Essential (primary) hypertension: Secondary | ICD-10-CM | POA: Insufficient documentation

## 2021-01-15 ENCOUNTER — Telehealth: Payer: Self-pay

## 2021-01-15 ENCOUNTER — Other Ambulatory Visit: Payer: Self-pay

## 2021-01-15 ENCOUNTER — Ambulatory Visit: Payer: BLUE CROSS/BLUE SHIELD | Admitting: Cardiology

## 2021-01-15 VITALS — BP 132/90 | HR 70 | Ht 77.0 in | Wt 310.0 lb

## 2021-01-15 DIAGNOSIS — Z01810 Encounter for preprocedural cardiovascular examination: Secondary | ICD-10-CM

## 2021-01-15 DIAGNOSIS — I739 Peripheral vascular disease, unspecified: Secondary | ICD-10-CM | POA: Diagnosis not present

## 2021-01-15 DIAGNOSIS — E785 Hyperlipidemia, unspecified: Secondary | ICD-10-CM

## 2021-01-15 DIAGNOSIS — I10 Essential (primary) hypertension: Secondary | ICD-10-CM | POA: Diagnosis not present

## 2021-01-15 DIAGNOSIS — F17200 Nicotine dependence, unspecified, uncomplicated: Secondary | ICD-10-CM

## 2021-01-15 MED ORDER — ROSUVASTATIN CALCIUM 20 MG PO TABS: 20.0000 mg | ORAL_TABLET | Freq: Every day | ORAL | 1 refills | Status: DC

## 2021-01-15 NOTE — Telephone Encounter (Signed)
   [facility] Pre-operative Risk Assessment    Request for surgical clearance:  1. What type of surgery is being performed? Right Lower Extremity Fem-Pop bypass   2. When is this surgery scheduled? 01/23/2021   3. What type of clearance is required (medical clearance vs. Pharmacy clearance to hold med vs. Both)? Cardiac Clearance  4. Are there any medications that need to be held prior to surgery and how long? Not specified however, the patient is on Plavix and ASA   5. Practice name and name of physician performing surgery? Dr. Donzetta Matters at [facility]    6. What is your office phone number: [phone]    7.   What is your office fax number: [phone]  8.   Anesthesia type (None, local, MAC, general) ? Not specified   Sean [name] 01/15/2021, 2:23 PM  _________________________________________________________________   (provider comments below)

## 2021-01-15 NOTE — Progress Notes (Signed)
Cardiology Consultation:    Date:  01/15/2021   ID:  Sean [name], DOB November 07, 1970, MRN [id number]  PCP:  [facility]  Cardiologist:  Gypsy Balsam, MD   Referring MD: Vicki Mallet*   Chief Complaint  Patient presents with  . Clearance 01/24/2020    Vascular Surgery    History of Present Illness:    Sean [name] is a 50 y.o. male who is being seen today for the evaluation of I need vascular surgery on my right leg at the request of Vicki Mallet*.  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 His ability to exercise very limited because of amputation on the left side and severe claudication on the right he said he does not have any resting pain but anytime he tries to walk he gets pain very quickly to the point that he have to stop many times while walking.  He lives overall very sedentary lifestyle.  Denies have any cardiac complaints meaning there is no chest pain tightness squeezing pressure burning chest no palpitations no dizziness no swelling of lower extremities. He still smokes about half pack per day he said he has been smoking since he was 19. He does not exercise in any fashion He is not on any diet Does not have family history of premature coronary artery disease He never had any heart trouble  Past Medical History:  Diagnosis Date  . Acquired deformity of right hand 11/27/2015   Formatting of this note might be different from the original. Secondary to injury/surgeries  . 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 Arthritis 05/06/2014  . Carpal tunnel syndrome 05/06/2014  .  Chronic pain syndrome 02/13/2020  . Class 2 severe obesity due to excess calories with serious comorbidity and body mass index (BMI) of 35.0 to 35.9 in adult [facility]) 12/10/2020  . Dislocation of distal interphalangeal (DIP) joint of finger 05/06/2014  . Encounter for immunization 12/05/2020  . Erectile dysfunction due to diseases classified elsewhere 04/06/2020  . Essential hypertension 11/27/2015  . History of left below knee amputation (HCC) 05/16/2019  . Hypertension   . Impaired mobility and ADLs 05/16/2019  . Ischemia of extremity 09/21/2018  . Ischemia of left lower extremity 04/28/2019  . 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 Neuropathic pain 11/10/2019   Formatting of this note might be different from the original. Added automatically from request for surgery [phone]  . Other long term (current) drug therapy 12/05/2020  . PAD (peripheral artery disease) (HCC) 04/28/2019  . Phantom limb syndrome with pain (HCC) 09/12/2019   Formatting of this note might be different from the original. Added automatically from request for surgery [id number]  . PVD (peripheral vascular disease) (HCC)   . Scalp psoriasis 11/27/2015  . Tobacco use disorder 12/10/2020  . Weak urinary stream 12/10/2020    Past Surgical History:  Procedure Laterality Date  . ABDOMINAL AORTOGRAM W/LOWER EXTREMITY N/A 05/08/2019   Procedure: ABDOMINAL AORTOGRAM W/LOWER EXTREMITY;  Surgeon: Maeola Harman, MD;  Location: [facility] INVASIVE CV LAB;  Service: Cardiovascular;  Laterality: N/A;  . ABDOMINAL AORTOGRAM W/LOWER EXTREMITY N/A 12/23/2020   Procedure: ABDOMINAL AORTOGRAM W/LOWER EXTREMITY;  Surgeon: Maeola Harmanain, Brandon Christopher, MD;  [facility];  Service: Cardiovascular;  Laterality: N/A;  . AMPUTATION Left 01/30/2020   Procedure: LEFT ABOVE KNEE AMPUTATION;  Surgeon: Chuck Hintickson, Christopher S, MD;  [facility];  Service: Vascular;  Laterality: Left;  . ANGIOPLASTY Left 04/27/2019   Procedure:  Balloon Angioplasty of left posterior tibial artery;  Surgeon: Maeola Harmanain, Brandon Christopher, MD;  [facility];  Service: Vascular;  Laterality: Left;  . EMBOLECTOMY Left 09/21/2018   Procedure: THROMBECTOMY LEFT POPLITEAL ARTERY BYPASS LEFT POPLITEAL TO TIBIAL ARTERY WITH SAPHENOUS VEIN HARVEST - FASCIOTOMY;  Surgeon: Chuck Hintickson, Christopher S, MD;  [facility];  Service: Vascular;  Laterality: Left;  . finger surgery Right    small finger  (2 different surgeries)  . LOWER EXTREMITY ANGIOGRAM Left 04/27/2019   Procedure: LEFT LOWER EXTREMITY ANGIOGRAM;  Surgeon: Maeola Harmanain, Brandon Christopher, MD;  [facility];  Service: Vascular;  Laterality: Left;  . PERIPHERAL VASCULAR BALLOON ANGIOPLASTY Left 05/08/2019   Procedure: PERIPHERAL VASCULAR BALLOON ANGIOPLASTY;  Surgeon: Maeola Harmanain, Brandon Christopher, MD;  [facility];  Service: Cardiovascular;  Laterality: Left;  Posterior tibial  . PERIPHERAL VASCULAR INTERVENTION Left 04/28/2019   Procedure: PERIPHERAL VASCULAR INTERVENTION;  Surgeon: Cephus Shellinglark, Christopher J, MD;  Location: [facility] INVASIVE CV LAB;  Service: Cardiovascular;  Laterality: Left;  . PERIPHERAL VASCULAR THROMBECTOMY Bilateral 04/28/2019   Procedure: PERIPHERAL VASCULAR THROMBECTOMY;  Surgeon: Cephus Shellinglark, Christopher J, MD;  Location: [facility] INVASIVE CV LAB;  Service: Cardiovascular;  Laterality: Bilateral;  . PERIPHERAL VASCULAR THROMBECTOMY Left 05/08/2019   Procedure: PERIPHERAL VASCULAR THROMBECTOMY;  Surgeon: Maeola Harmanain, Brandon Christopher, MD;  [facility];  Service: Cardiovascular;  Laterality: Left;  posterior tibial  . TRANSMETATARSAL AMPUTATION Left 05/09/2019   Procedure: Left Below Knee Amputation;  Surgeon: Maeola Harmanain, Brandon Christopher, MD;  [facility];  Service: Vascular;  Laterality: Left;    Current Medications: Current Meds  Medication Sig  . Adalimumab (HUMIRA PEN) 40 MG/0.4ML PNKT Inject 40 mg into the skin every 14 (fourteen) days.  Marland Kitchen. amLODipine (NORVASC) 10  MG tablet Take 10 mg by mouth in the morning.  Marland Kitchen. aspirin EC 81 MG tablet Take 81 mg by mouth in the morning.  . Buprenorphine HCl (BELBUCA) 300 MCG FILM Place 300 mcg inside cheek in the morning and at bedtime.  . clobetasol ointment (TEMOVATE) 0.05 % Apply 1 application topically 2 (two) times daily as needed (skin irritation).  . clopidogrel (PLAVIX) 75 MG tablet Take 1 tablet (75 mg total) by mouth daily with breakfast.  . cyanocobalamin (,VITAMIN B-12,) 1000 MCG/ML injection Inject 1,000 mcg into the muscle once a week.  . DULoxetine (CYMBALTA) 30 MG capsule Take 30 mg by mouth in the morning.  . hydrALAZINE (APRESOLINE) 10 MG tablet Take 10 mg by mouth 2 (two) times daily.  Marland Kitchen. olmesartan-hydrochlorothiazide (BENICAR HCT) 40-25 MG tablet Take 1 tablet by mouth daily.  . pregabalin (LYRICA) 300 MG capsule Take 300 mg by mouth daily.  . rosuvastatin (CRESTOR) 10 MG tablet TAKE 1 TABLET BY MOUTH EVERY DAY (Patient taking differently: Take 10 mg by mouth in the morning.)  . Testosterone 20.25 MG/ACT (1.62%) GEL Apply 40.5 mg topically daily. Rub on shoulder  . vitamin B-12 (CYANOCOBALAMIN) 250 MCG tablet Take 250 mcg by mouth daily.  . Vitamin D, Ergocalciferol, (DRISDOL) 1.25 MG (50000 UNIT) CAPS capsule Take 50,000 Units by mouth every Thursday.  . zinc gluconate 50 MG tablet Take 50 mg by mouth daily.  Allergies:   Patient has no known allergies.   Social History   Socioeconomic History  . Marital status: Married    Spouse name: Not on file  . Number of children: Not on file  . Years of education: Not on file  . Highest education level: Not on file  Occupational History  . Not on file  Tobacco Use  . Smoking status: Current Every Day Smoker    Packs/day: 1.00    Types: Cigarettes  . Smokeless tobacco: Never Used  Vaping Use  . Vaping Use: Never used  Substance and Sexual Activity  . Alcohol use: Never  . Drug use: Never  . Sexual activity: Not on file  Other Topics  Concern  . Not on file  Social History Narrative  . Not on file   Social Determinants of Health   Financial Resource Strain: Not on file  Food Insecurity: Not on file  Transportation Needs: Not on file  Physical Activity: Not on file  Stress: Not on file  Social Connections: Not on file     Family History: The patient's family history includes Diabetes in his father. ROS:   Please see the history of present illness.    All 14 point review of systems negative except as described per history of present illness.  EKGs/Labs/Other Studies Reviewed:    The following studies were reviewed today:   EKG:  EKG is  ordered today.  The ekg ordered today demonstrates normal sinus rhythm, left anterior fascicular block, cannot rule out anterior wall MI  Recent Labs: 01/30/2020: ALT 27 02/13/2020: Platelets 388 12/23/2020: BUN 9; Creatinine, Ser 0.80; Hemoglobin 15.3; Potassium 3.5; Sodium 143  Recent Lipid Panel    Component Value Date/Time   CHOL 166 02/01/2020 0326   TRIG 136 02/01/2020 0326   HDL 34 (L) 02/01/2020 0326   CHOLHDL 4.9 02/01/2020 0326   VLDL 27 02/01/2020 0326   LDLCALC 105 (H) 02/01/2020 0326    Physical Exam:    VS:  BP 132/90 (BP Location: Right Arm, Patient Position: Sitting)   Pulse 70   Ht 6\' 5"  (1.956 m)   Wt (!) 310 lb (140.6 kg)   SpO2 98%   BMI 36.76 kg/m     Wt Readings from Last 3 Encounters:  01/15/21 (!) 310 lb (140.6 kg)  12/23/20 (!) 304 lb (137.9 kg)  12/05/20 (!) 304 lb 14.4 oz (138.3 kg)     GEN:  Well nourished, well developed in no acute distress HEENT: Normal NECK: No JVD; No carotid bruits LYMPHATICS: No lymphadenopathy CARDIAC: RRR, no murmurs, no rubs, no gallops RESPIRATORY:  Clear to auscultation without rales, wheezing or rhonchi  ABDOMEN: Soft, non-tender, non-distended MUSCULOSKELETAL: Left above-knee amputation, right side no pulses below knee SKIN: Warm and dry NEUROLOGIC:  Alert and oriented x 3 PSYCHIATRIC:  Normal  affect   ASSESSMENT:    1. PVD (peripheral vascular disease) (HCC)   2. Preop cardiovascular exam   3. Tobacco use disorder   4. Primary hypertension   5. Dyslipidemia    PLAN:    In order of problems listed above:  1. Advanced peripheral vascular disease.  He does have above-knee amputation on the left side and now he required intervention on the right popliteal artery.  He need to be screened for significant coronary artery disease.  He need to get stress test done as well as echocardiogram before pursuing surgical intervention for his leg.  Hopefully I will be able to make arrangements for  this very quickly then his surgery will be completed on Tuesday as scheduled.  He is already on antiplatelet therapy which advised to continue.  Of course absolute essential his risk factors modifications sadly he still continues to smoke we spent at least 5 minutes talking about need to quit he understand and hopefully will be able to accomplish that.  On top of that his cholesterol is not sufficiently controlled, his LDL is 105 need to be less than 70.  I asked him to start taking 20 mg Crestor every single day. 2. Essential hypertension blood pressure control continue present management. 3. Smoking again we spent at least 10 minutes talking about this and I strongly recommend to quit. 4. Cardiovascular preop evaluation on this gentleman with advanced atherosclerosis and peripheral vascular disease with multiple risk factors, luckily he does not have any cardiac symptoms but at the same time he is not able to accomplish level of exercise that will bring to his symptoms, therefore, we must check him for advanced coronary artery disease.  In the future we probably end up doing cardiac ultrasounds to make sure he does not have any carotic occlusive disease.  No bruit likely on the physical exam.  Of course the key also his risk factors modifications   Medication Adjustments/Labs and Tests Ordered: Current  medicines are reviewed at length with the patient today.  Concerns regarding medicines are outlined above.  No orders of the defined types were placed in this encounter.  No orders of the defined types were placed in this encounter.   Signed, Georgeanna Lea, MD, [facility]. 01/15/2021 2:08 PM    [facility]

## 2021-01-15 NOTE — Telephone Encounter (Addendum)
Pt seen today as new pt with Dr. Mauri Brooklyn.  Dr. Bing Matter - Can you provide clearance recommendations once results of echocardiogram and Myoview are back?  Route to P CV DIV PREOP.  Tereso Newcomer, PA-C    01/15/2021 2:58 PM

## 2021-01-15 NOTE — Patient Instructions (Signed)
Medication Instructions:  Your physician has recommended you make the following change in your medication:  INCREASE: Crestor 20 mg daily   *If you need a refill on your cardiac medications before your next appointment, please call your pharmacy*   Lab Work: None If you have labs (blood work) drawn today and your tests are completely normal, you will receive your results only by: Marland Kitchen MyChart Message (if you have MyChart) OR . A paper copy in the mail If you have any lab test that is abnormal or we need to change your treatment, we will call you to review the results.   Testing/Procedures: Your physician has requested that you have an echocardiogram. Echocardiography is a painless test that uses sound waves to create images of your heart. It provides your doctor with information about the size and shape of your heart and how well your heart's chambers and valves are working. This procedure takes approximately one hour. There are no restrictions for this procedure.    [facility] at [facility] [address] [place], [place] [address] Phone: [phone]    Please arrive 15 minutes prior to your appointment time for registration and insurance purposes.  The test will take approximately 3 to 4 hours to complete; you may bring reading material.  If someone comes with you to your appointment, they will need to remain in the main lobby due to limited space in the testing area. **If you are pregnant or breastfeeding, please notify the nuclear lab prior to your appointment**  How to prepare for your Myocardial Perfusion Test: . Do not eat or drink 3 hours prior to your test, except you may have water. . Do not consume products containing caffeine (regular or decaffeinated) 12 hours prior to your test. (ex: coffee, chocolate, sodas, tea). . Do bring a list of your current medications with you.  If not listed below, you may take your medications as  normal. .  . Do wear comfortable clothes (no dresses or overalls) and walking shoes, tennis shoes preferred (No heels or open toe shoes are allowed). . Do NOT wear cologne, perfume, aftershave, or lotions (deodorant is allowed). . If these instructions are not followed, your test will have to be rescheduled.  Please report to [address], [address] for your test.  If you have questions or concerns about your appointment, you can call the Nuclear Lab at [phone].  If you cannot keep your appointment, please provide 24 hours notification to the Nuclear Lab, to avoid a possible $50 charge to your account.    Follow-Up: At [facility], you and your health needs are our priority.  As part of our continuing mission to provide you with exceptional heart care, we have created designated Provider Care Teams.  These Care Teams include your primary Cardiologist (physician) and Advanced Practice Providers (APPs -  Physician Assistants and Nurse Practitioners) who all work together to provide you with the care you need, when you need it.  We recommend signing up for the patient portal called "MyChart".  Sign up information is provided on this After Visit Summary.  MyChart is used to connect with patients for Virtual Visits (Telemedicine).  Patients are able to view lab/test results, encounter notes, upcoming appointments, etc.  Non-urgent messages can be sent to your provider as well.   To learn more about what you can do with MyChart, go to [email or web address].    Your next appointment:   3 month(s)  The format for your next appointment:   In Person  Provider:   Gypsy Balsam, MD   Other Instructions   Echocardiogram An echocardiogram is a test that uses sound waves (ultrasound) to produce images of the heart. Images from an echocardiogram can provide important information about:  Heart size and shape.  The size and thickness and movement of your heart's  walls.  Heart muscle function and strength.  Heart valve function or if you have stenosis. Stenosis is when the heart valves are too narrow.  If blood is flowing backward through the heart valves (regurgitation).  A tumor or infectious growth around the heart valves.  Areas of heart muscle that are not working well because of poor blood flow or injury from a heart attack.  Aneurysm detection. An aneurysm is a weak or damaged part of an artery wall. The wall bulges out from the normal force of blood pumping through the body. Tell a health care provider about:  Any allergies you have.  All medicines you are taking, including vitamins, herbs, eye drops, creams, and over-the-counter medicines.  Any blood disorders you have.  Any surgeries you have had.  Any medical conditions you have.  Whether you are pregnant or may be pregnant. What are the risks? Generally, this is a safe test. However, problems may occur, including an allergic reaction to dye (contrast) that may be used during the test. What happens before the test? No specific preparation is needed. You may eat and drink normally. What happens during the test?  You will take off your clothes from the waist up and put on a hospital gown.  Electrodes or electrocardiogram (ECG)patches may be placed on your chest. The electrodes or patches are then connected to a device that monitors your heart rate and rhythm.  You will lie down on a table for an ultrasound exam. A gel will be applied to your chest to help sound waves pass through your skin.  A handheld device, called a transducer, will be pressed against your chest and moved over your heart. The transducer produces sound waves that travel to your heart and bounce back (or "echo" back) to the transducer. These sound waves will be captured in real-time and changed into images of your heart that can be viewed on a video monitor. The images will be recorded on a computer and  reviewed by your health care provider.  You may be asked to change positions or hold your breath for a short time. This makes it easier to get different views or better views of your heart.  In some cases, you may receive contrast through an IV in one of your veins. This can improve the quality of the pictures from your heart. The procedure may vary among health care providers and hospitals.   What can I expect after the test? You may return to your normal, everyday life, including diet, activities, and medicines, unless your health care provider tells you not to do that. Follow these instructions at home:  It is up to you to get the results of your test. Ask your health care provider, or the department that is doing the test, when your results will be ready.  Keep all follow-up visits. This is important. Summary  An echocardiogram is a test that uses sound waves (ultrasound) to produce images of the heart.  Images from an echocardiogram can provide important information about the size and shape of your heart, heart muscle function, heart valve function, and other  possible heart problems.  You do not need to do anything to prepare before this test. You may eat and drink normally.  After the echocardiogram is completed, you may return to your normal, everyday life, unless your health care provider tells you not to do that. This information is not intended to replace advice given to you by your health care provider. Make sure you discuss any questions you have with your health care provider. Document Revised: 04/02/2020 Document Reviewed: 04/02/2020 Elsevier Patient Education  2021 [place].   Cardiac Nuclear Scan A cardiac nuclear scan is a test that measures blood flow to the heart when a person is resting and when he or she is exercising. The test looks for problems such as:  Not enough blood reaching a portion of the heart.  The heart muscle not working normally. You may need  this test if:  You have heart disease.  You have had abnormal lab results.  You have had heart surgery or a balloon procedure to open up blocked arteries (angioplasty).  You have chest pain.  You have shortness of breath. In this test, a radioactive dye (tracer) is injected into your bloodstream. After the tracer has traveled to your heart, an imaging device is used to measure how much of the tracer is absorbed by or distributed to various areas of your heart. This procedure is usually done at a hospital and takes 2-4 hours. Tell a health care provider about:  Any allergies you have.  All medicines you are taking, including vitamins, herbs, eye drops, creams, and over-the-counter medicines.  Any problems you or family members have had with anesthetic medicines.  Any blood disorders you have.  Any surgeries you have had.  Any medical conditions you have.  Whether you are pregnant or may be pregnant. What are the risks? Generally, this is a safe procedure. However, problems may occur, including:  Serious chest pain and heart attack. This is only a risk if the stress portion of the test is done.  Rapid heartbeat.  Sensation of warmth in your chest. This usually passes quickly.  Allergic reaction to the tracer. What happens before the procedure?  Ask your health care provider about changing or stopping your regular medicines. This is especially important if you are taking diabetes medicines or blood thinners.  Follow instructions from your health care provider about eating or drinking restrictions.  Remove your jewelry on the day of the procedure. What happens during the procedure?  An IV will be inserted into one of your veins.  Your health care provider will inject a small amount of radioactive tracer through the IV.  You will wait for 20-40 minutes while the tracer travels through your bloodstream.  Your heart activity will be monitored with an electrocardiogram  (ECG).  You will lie down on an exam table.  Images of your heart will be taken for about 15-20 minutes.  You may also have a stress test. For this test, one of the following may be done: ? You will exercise on a treadmill or stationary bike. While you exercise, your heart's activity will be monitored with an ECG, and your blood pressure will be checked. ? You will be given medicines that will increase blood flow to parts of your heart. This is done if you are unable to exercise.  When blood flow to your heart has peaked, a tracer will again be injected through the IV.  After 20-40 minutes, you will get back on the exam  table and have more images taken of your heart.  Depending on the type of tracer used, scans may need to be repeated 3-4 hours later.  Your IV line will be removed when the procedure is over. The procedure may vary among health care providers and hospitals. What happens after the procedure?  Unless your health care provider tells you otherwise, you may return to your normal schedule, including diet, activities, and medicines.  Unless your health care provider tells you otherwise, you may increase your fluid intake. This will help to flush the contrast dye from your body. Drink enough fluid to keep your urine pale yellow.  Ask your health care provider, or the department that is doing the test: ? When will my results be ready? ? How will I get my results? Summary  A cardiac nuclear scan measures the blood flow to the heart when a person is resting and when he or she is exercising.  Tell your health care provider if you are pregnant.  Before the procedure, ask your health care provider about changing or stopping your regular medicines. This is especially important if you are taking diabetes medicines or blood thinners.  After the procedure, unless your health care provider tells you otherwise, increase your fluid intake. This will help flush the contrast dye from your  body.  After the procedure, unless your health care provider tells you otherwise, you may return to your normal schedule, including diet, activities, and medicines. This information is not intended to replace advice given to you by your health care provider. Make sure you discuss any questions you have with your health care provider. Document Revised: 01/24/2018 Document Reviewed: 01/24/2018 Elsevier Patient Education  [place].

## 2021-01-15 NOTE — Pre-Procedure Instructions (Signed)
Surgical Instructions:    Your procedure is scheduled on Thursday, June 2nd (07:30 AM- 10:32 AM).  Report to [facility] Main Entrance "A" at 05:30 A.M., then check in with the Admitting office.  Call this number if you have any questions prior to, or have any problems the morning of surgery:  [phone]    Remember:  Do not eat or drink after midnight the night before your surgery.    Take these medicines the morning of surgery with A SIP OF WATER: amLODipine (NORVASC)   DULoxetine (CYMBALTA) hydrALAZINE (APRESOLINE) pregabalin (LYRICA) rosuvastatin (CRESTOR)    *Follow your surgeon's instructions on when to stop clopidogrel (PLAVIX) and Aspirin.  If no instructions were given by your surgeon then you will need to call the office to get those instructions.     As of today, STOP taking any Aleve, Naproxen, Ibuprofen, Motrin, Advil, Goody's, BC's, all herbal medications, fish oil, and all vitamins.             Special instructions:   [name]- Preparing For Surgery  Before surgery, you can play an important role. Because skin is not sterile, your skin needs to be as free of germs as possible. You can reduce the number of germs on your skin by washing with CHG (chlorahexidine gluconate) Soap before surgery.  CHG is an antiseptic cleaner which kills germs and bonds with the skin to continue killing germs even after washing.    Oral Hygiene is also important to reduce your risk of infection.  Remember - BRUSH YOUR TEETH THE MORNING OF SURGERY WITH YOUR REGULAR TOOTHPASTE  Please do not use if you have an allergy to CHG or antibacterial soaps. If your skin becomes reddened/irritated stop using the CHG.  Do not shave (including legs and underarms) for at least 48 hours prior to first CHG shower. It is OK to shave your face.  Please follow these instructions carefully.   1. Shower the NIGHT BEFORE SURGERY and the MORNING OF SURGERY  2. If you chose to wash your hair, wash your  hair first as usual with your normal shampoo.  3. After you shampoo, rinse your hair and body thoroughly to remove the shampoo.  4. Wash Face and genitals (private parts) with your normal soap.   5. Use CHG Soap as you would any other liquid soap. You can apply CHG directly to the skin and wash gently with a scrungie or a clean washcloth.   6. Apply the CHG Soap to your body ONLY FROM THE NECK DOWN.  Do not use on open wounds or open sores. Avoid contact with your eyes, ears, mouth and genitals (private parts). Wash Face and genitals (private parts)  with your normal soap.   7. Wash thoroughly, paying special attention to the area where your surgery will be performed.  8. Thoroughly rinse your body with warm water from the neck down.  9. DO NOT shower/wash with your normal soap after using and rinsing off the CHG Soap.  10. Pat yourself dry with a CLEAN TOWEL.  11. Wear CLEAN PAJAMAS to bed the night before surgery.  12. Place CLEAN SHEETS on your bed the night before your surgery.  13. DO NOT SLEEP WITH PETS.   Day of Surgery: SHOWER with CHG soap. Brush your teeth WITH YOUR REGULAR TOOTHPASTE. Wear Clean/Comfortable clothing the morning of surgery. Do not apply any deodorants/lotions.   Do not wear jewelry. Do not shave 48 hours prior to surgery.  Men may  shave face and neck. Do not bring valuables to the hospital. [facility] is not responsible for any belongings or valuables.   Do NOT Smoke (Tobacco/Vaping) or drink Alcohol 24 hours prior to your procedure.   If you use a CPAP at night, you may bring all equipment for your overnight stay.   Contacts, glasses, or dentures may not be worn into surgery, please bring cases for these belongings.   For patients admitted to the hospital, discharge time will be determined by your treatment team.   Patients discharged the day of surgery will not be allowed to drive home, and someone needs to stay with them for 24 hours.     Please read over the following fact sheets that you were given.

## 2021-01-16 ENCOUNTER — Other Ambulatory Visit: Payer: Self-pay

## 2021-01-16 ENCOUNTER — Ambulatory Visit (HOSPITAL_COMMUNITY)
Admission: RE | Admit: 2021-01-16 | Discharge: 2021-01-16 | Disposition: A | Discharge status: Home / Self Care | Payer: BLUE CROSS/BLUE SHIELD | Source: Ambulatory Visit | Attending: Cardiology | Admitting: Cardiology

## 2021-01-16 ENCOUNTER — Encounter (HOSPITAL_COMMUNITY): Payer: Self-pay

## 2021-01-16 ENCOUNTER — Encounter (HOSPITAL_COMMUNITY)
Admission: RE | Admit: 2021-01-16 | Discharge: 2021-01-16 | Disposition: A | Discharge status: Home / Self Care | Payer: BLUE CROSS/BLUE SHIELD | Source: Ambulatory Visit | Attending: Vascular Surgery | Admitting: Vascular Surgery

## 2021-01-16 DIAGNOSIS — I10 Essential (primary) hypertension: Secondary | ICD-10-CM

## 2021-01-16 DIAGNOSIS — E785 Hyperlipidemia, unspecified: Secondary | ICD-10-CM | POA: Diagnosis not present

## 2021-01-16 DIAGNOSIS — I7389 Other specified peripheral vascular diseases: Secondary | ICD-10-CM | POA: Diagnosis not present

## 2021-01-16 DIAGNOSIS — I119 Hypertensive heart disease without heart failure: Secondary | ICD-10-CM | POA: Insufficient documentation

## 2021-01-16 DIAGNOSIS — I739 Peripheral vascular disease, unspecified: Secondary | ICD-10-CM | POA: Insufficient documentation

## 2021-01-16 DIAGNOSIS — Q231 Congenital insufficiency of aortic valve: Secondary | ICD-10-CM | POA: Diagnosis not present

## 2021-01-16 DIAGNOSIS — Z01810 Encounter for preprocedural cardiovascular examination: Secondary | ICD-10-CM | POA: Diagnosis present

## 2021-01-16 DIAGNOSIS — F17200 Nicotine dependence, unspecified, uncomplicated: Secondary | ICD-10-CM | POA: Insufficient documentation

## 2021-01-16 HISTORY — DX: Depression, unspecified: F32.A

## 2021-01-16 LAB — URINALYSIS, ROUTINE W REFLEX MICROSCOPIC
Bacteria, UA: NONE SEEN
Bilirubin Urine: NEGATIVE
Glucose, UA: NEGATIVE mg/dL
Ketones, ur: NEGATIVE mg/dL
Leukocytes,Ua: NEGATIVE
Nitrite: NEGATIVE
Protein, ur: NEGATIVE mg/dL
Specific Gravity, Urine: 1.008 (ref 1.005–1.030)
pH: 7 (ref 5.0–8.0)

## 2021-01-16 LAB — TYPE AND SCREEN
ABO/RH(D): O POS
Antibody Screen: NEGATIVE

## 2021-01-16 LAB — PROTIME-INR
INR: 1 (ref 0.8–1.2)
Prothrombin Time: 12.7 seconds (ref 11.4–15.2)

## 2021-01-16 LAB — COMPREHENSIVE METABOLIC PANEL
ALT: 37 U/L (ref 0–44)
AST: 27 U/L (ref 15–41)
Albumin: 3.9 g/dL (ref 3.5–5.0)
Alkaline Phosphatase: 76 U/L (ref 38–126)
Anion gap: 7 (ref 5–15)
BUN: 7 mg/dL (ref 6–20)
CO2: 27 mmol/L (ref 22–32)
Calcium: 9.3 mg/dL (ref 8.9–10.3)
Chloride: 106 mmol/L (ref 98–111)
Creatinine, Ser: 0.92 mg/dL (ref 0.61–1.24)
GFR, Estimated: >60 mL/min (ref >60)
Glucose, Bld: 158 mg/dL — ABNORMAL HIGH (ref 70–99)
Potassium: 3.7 mmol/L (ref 3.5–5.1)
Sodium: 140 mmol/L (ref 135–145)
Total Bilirubin: 0.9 mg/dL (ref 0.3–1.2)
Total Protein: 6.6 g/dL (ref 6.5–8.1)

## 2021-01-16 LAB — ECHOCARDIOGRAM COMPLETE
AR max vel: 3.29 cm2
AV Area VTI: 4.13 cm2
AV Area mean vel: 3.42 cm2
AV Mean grad: 4 mmHg
AV Peak grad: 7.5 mmHg
Ao pk vel: 1.37 m/s
Area-P 1/2: 2.54 cm2
Calc EF: 50.2 %
Height: 77.5 in
S' Lateral: 3.6 cm
Single Plane A2C EF: 55.7 %
Single Plane A4C EF: 41.3 %
Weight: 4964 oz

## 2021-01-16 LAB — SURGICAL PCR SCREEN
MRSA, PCR: NEGATIVE
Staphylococcus aureus: NEGATIVE

## 2021-01-16 LAB — CBC
HCT: 48.5 % (ref 39.0–52.0)
Hemoglobin: 15.8 g/dL (ref 13.0–17.0)
MCH: 26.2 pg (ref 26.0–34.0)
MCHC: 32.6 g/dL (ref 30.0–36.0)
MCV: 80.4 fL (ref 80.0–100.0)
Platelets: 291 10*3/uL (ref 150–400)
RBC: 6.03 MIL/uL — ABNORMAL HIGH (ref 4.22–5.81)
RDW: 14.6 % (ref 11.5–15.5)
WBC: 8.2 10*3/uL (ref 4.0–10.5)
nRBC: 0 % (ref 0.0–0.2)

## 2021-01-16 LAB — APTT: aPTT: 27 seconds (ref 24–36)

## 2021-01-16 NOTE — Progress Notes (Addendum)
PCP: Ricki Rodriguez, NP Cardiologist: Gypsy Balsam, MD  EKG: 12/23/20 CXR: na ECHO: 01/16/21 Stress Test: per patient, scheduled stress test 01/21/21 Cardiac Cath: denies  Fasting Blood Sugar- na Checks Blood Sugar__na_ times a day  OSA/CPAP: No  ASA: Continue Blood Thinner: Plavix stop 5 days prior to surgery per patient  Covid test 01/21/21  Anesthesia Review: Yes, cardiac history.  PA requested cardiac clearance after stress test today at 1300  Patient denies shortness of breath, fever, cough, and chest pain at PAT appointment.  Patient verbalized understanding of instructions provided today at the PAT appointment.  Patient asked to review instructions at home and day of surgery.

## 2021-01-17 ENCOUNTER — Telehealth: Payer: Self-pay | Admitting: Emergency Medicine

## 2021-01-17 ENCOUNTER — Encounter: Payer: Self-pay | Admitting: Cardiology

## 2021-01-17 DIAGNOSIS — I7789 Other specified disorders of arteries and arterioles: Secondary | ICD-10-CM

## 2021-01-17 NOTE — Telephone Encounter (Signed)
-----   Message from Georgeanna Lea, MD sent at 01/17/2021  8:59 AM EDT ----- Echocardiogram showed bicuspid aortic valve significantly dilated aortic root measuring 50 mm, overall ejection fraction 5055%.  He will required gated CT angiogram of the aorta with no contrast

## 2021-01-17 NOTE — Telephone Encounter (Signed)
Called patient informed him of results. Placed order for ct. Will get in touch with schedule for patient to be scheduled. Patient ware they will call.

## 2021-01-21 ENCOUNTER — Other Ambulatory Visit (HOSPITAL_COMMUNITY)
Admission: RE | Admit: 2021-01-21 | Discharge: 2021-01-21 | Disposition: A | Discharge status: Home / Self Care | Payer: BLUE CROSS/BLUE SHIELD | Source: Ambulatory Visit | Attending: Vascular Surgery | Admitting: Vascular Surgery

## 2021-01-21 DIAGNOSIS — Z01812 Encounter for preprocedural laboratory examination: Secondary | ICD-10-CM | POA: Insufficient documentation

## 2021-01-21 DIAGNOSIS — Z20822 Contact with and (suspected) exposure to covid-19: Secondary | ICD-10-CM | POA: Insufficient documentation

## 2021-01-21 DIAGNOSIS — E538 Deficiency of other specified B group vitamins: Secondary | ICD-10-CM | POA: Insufficient documentation

## 2021-01-21 DIAGNOSIS — Z01810 Encounter for preprocedural cardiovascular examination: Secondary | ICD-10-CM

## 2021-01-21 LAB — SARS CORONAVIRUS 2 (TAT 6-24 HRS): SARS Coronavirus 2: NEGATIVE

## 2021-01-21 NOTE — Telephone Encounter (Signed)
Will send a message to Dr. Vanetta Shawl nurse Mayme Genta to see if test have been ordered and scheduled for pre op clearance. See previous notes. Surgeon's office is closed since it is after 5 pm. I will send notes to surgeon's office informing them the pt's procedure is going to need to be postponed. I am out of the office tomorrow though I will ask the pre op call back person for tomorrow to be sure to follow up on this, that all parties received the message procedure will need to be postponed.

## 2021-01-21 NOTE — Progress Notes (Addendum)
Addendum:  Dr. 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 I have added CT results to imaging list below.  Ascending aorta dilated at 5.2cm- semi-annual imaging recommended.   If no changes, I anticipate pt can proceed with surgery as scheduled.    Rica Mast, PhD, FNP-BC [facility] Short Stay Surgical [facility]/Anesthesiology Phone: [phone] 01/22/2021 4:02 PM    Anesthesia Chart Review:  History of advanced peripheral vascular disease.  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  Patient seen for preop cardiology eval by Dr. Bing Matter on 01/15/2021.  Echo and stress test were ordered.  Echo 01/16/2021 showed EF 50 to 55%, bicuspid aortic valve with moderately dilated aortic root (49 mm) and severely dilated ascending aorta (50 mm).  Dr. Bing Matter stated patient will require gated CT angiogram of aorta with no contrast.  Nuclear stress test 01/21/2021 showed no evidence of prior infarction or ischemia, minimal amount of hypokinesia involving apical aspect of the septum, otherwise normal wall motion, EF 52%.  I spoke with Kathlen Mody, RN and Dr. Darcella Cheshire office about the fact that the CT angiogram recommended by Dr. Bing Matter is still pending.  She is following up with the patient and cardiology.  Preop labs reviewed, unremarkable.  EKG 12/23/2020: NSR.  Rate 67.  Nuclear stress 01/21/2021: IMPRESSION: 1. No scintigraphic evidence of prior infarction or pharmacologically induced ischemia..  2. Minimal amount of hypokinesia involving the apical aspect of the septum. Otherwise, normal wall motion.  3. Left ventricular ejection fraction 52%  TTE 01/16/2021: 1. Bicuspid aortic valve; severely dilated ascending aorta (50 mm);  suggest CTA to further assess.  2. Left ventricular ejection fraction, by estimation, is 50 to 55%. The  left ventricle has low normal function. The left ventricle has no regional  wall motion  abnormalities. There is moderate left ventricular hypertrophy.  Left ventricular diastolic  parameters are consistent with Grade I diastolic dysfunction (impaired  relaxation).  3. Right ventricular systolic function is normal. The right ventricular  size is normal.  4. The mitral valve is normal in structure. No evidence of mitral valve  regurgitation. No evidence of mitral stenosis.  5. The aortic valve is bicuspid. Aortic valve regurgitation is not  visualized. No aortic stenosis is present.  6. Aortic dilatation noted. There is moderate dilatation of the aortic  root, measuring 49 mm. There is severe dilatation of the ascending aorta,  measuring 50 mm.  7. The inferior vena cava is normal in size with greater than 50%  respiratory variability, suggesting right atrial pressure of 3 mmHg.    Zannie Cove [facility] Short Stay Center/Anesthesiology Phone [phone] 01/21/2021 3:43 PM

## 2021-01-21 NOTE — Telephone Encounter (Signed)
   Name: Sean [name]  DOB: 1971/05/01  MRN: [id number]   Primary Cardiologist: Gypsy Balsam, MD  Chart reviewed as part of pre-operative protocol coverage.   Patient was seen by Dr. Bing Matter 01/14/21 for preop assessment for upcoming vascular surgery. He was recommended to undergo an echocardiogram and NST at that time. Echo 01/16/21 showed EF 50-55%, no RWMA, moderate LVH, G1DD, and a bicuspid aortic valve with severely dilated ascending aorta measuring 19mm. He was recommended to undergo a CTA chest/aorta at that time. Both the CTA chest and NST remain unscheduled. At this point I do not think these tests will be done in time to continue with plans for surgery 01/23/21. As such, surgery will need to be postponed until the above cardiac work-up can be completed.  Callback: - Please follow-up on the timing of the pending CTA chest and NST in an effort to expedite his work-up and not delay his procedure further - Please notify the requesting surgeons office of the above pending tests and need to delay his procedure to accommodate his remaining work-up. Thank you!  Beatriz Stallion, PA-C 01/21/2021, 5:00 PM

## 2021-01-21 NOTE — Telephone Encounter (Signed)
Patient called  To see if he was cleared for his surgery on January 23, 2021. Please advise

## 2021-01-22 ENCOUNTER — Telehealth: Payer: Self-pay | Admitting: Cardiology

## 2021-01-22 ENCOUNTER — Other Ambulatory Visit: Payer: Self-pay

## 2021-01-22 ENCOUNTER — Ambulatory Visit (INDEPENDENT_AMBULATORY_CARE_PROVIDER_SITE_OTHER)
Admission: RE | Admit: 2021-01-22 | Discharge: 2021-01-22 | Disposition: A | Discharge status: Home / Self Care | Payer: BLUE CROSS/BLUE SHIELD | Source: Ambulatory Visit | Attending: Cardiology | Admitting: Cardiology

## 2021-01-22 DIAGNOSIS — I7789 Other specified disorders of arteries and arterioles: Secondary | ICD-10-CM | POA: Diagnosis not present

## 2021-01-22 NOTE — Telephone Encounter (Signed)
Please refer to telephone encounter 01/15/21.  Alver Sorrow, NP

## 2021-01-22 NOTE — Telephone Encounter (Signed)
Checking with scheduling on this. Checked [place] for records of stress test there is not one for him.

## 2021-01-22 NOTE — Telephone Encounter (Signed)
Pt is aware to arrive at the [facility] location in [place] today at 2:00 for his STAT CTA.

## 2021-01-22 NOTE — Telephone Encounter (Signed)
Able to access [facility] records. Stress test 01/21/21 initial notes by Dr. Bing Matter during procedure: Resting heart rate 76 bpm. Resting BP 144/125mmHg. Resting EKG NSR, normal P interval, nonspecific ST segment changes. EKg during infusion Lexiscan and shortly after showed no acute changes. Symptoms noted during infusion include mild nausea.   Conclusions: Uneventful Lexiscan infusion. Nuclear imaging will be generated separately on report by radiologist.   Stress test 01/21/21 at [facility] result detailed below as read by radiology. Await formal review by Dr. Bing Matter.   FINDINGS: Raw images: Mild GI attenuation is seen, worse on provided rest images. There is no significant patient motion artifact or chest wall attenuation  Perfusion: There is a minimal amount of attenuation involving the inferior wall of the left ventricle which nearly resolves on the provided stress images and is without associated regional wall motion abnormality.  Wall Motion: There is a very minimal amount of hypokinesia involving the apical aspect of the septum. Otherwise, normal wall motion.  Left Ventricular Ejection Fraction: 52 %  End diastolic volume 119 ml  End systolic volume 57 ml  IMPRESSION: 1. No scintigraphic evidence of prior infarction or pharmacologically induced ischemia..  2. Minimal amount of hypokinesia involving the apical aspect of the septum. Otherwise, normal wall motion.  3. Left ventricular ejection fraction 52%   Electronically Signed   By: Simonne Come M.D.   On: 01/21/2021 12:29

## 2021-01-22 NOTE — Anesthesia Preprocedure Evaluation (Addendum)
Anesthesia Evaluation  Patient identified by MRN, date of birth, ID band Patient awake    Reviewed: Allergy & Precautions, NPO status , Patient's Chart, lab work & pertinent test results  Airway Mallampati: II  TM Distance: >3 FB Neck ROM: Full    Dental  (+) Dental Advisory Given, Poor Dentition, Chipped   Pulmonary Current Smoker and Patient abstained from smoking.,    Pulmonary exam normal breath sounds clear to auscultation       Cardiovascular hypertension, Pt. on medications + Peripheral Vascular Disease  Normal cardiovascular exam Rhythm:Regular Rate:Normal     Neuro/Psych PSYCHIATRIC DISORDERS Depression  Neuromuscular disease    GI/Hepatic negative GI ROS, Neg liver ROS,   Endo/Other  negative endocrine ROSObesity   Renal/GU negative Renal ROS     Musculoskeletal  (+) Arthritis ,   Abdominal   Peds  Hematology  (+) Blood dyscrasia (Plavix), ,   Anesthesia Other Findings Day of surgery medications reviewed with the patient.  Reproductive/Obstetrics                            Anesthesia Physical Anesthesia Plan  ASA: III  Anesthesia Plan: General   Post-op Pain Management:    Induction: Intravenous  PONV Risk Score and Plan: 1 and Midazolam, Dexamethasone and Ondansetron  Airway Management Planned: Oral ETT  Additional Equipment: Arterial line  Intra-op Plan:   Post-operative Plan: Extubation in OR  Informed Consent: I have reviewed the patients History and Physical, chart, labs and discussed the procedure including the risks, benefits and alternatives for the proposed anesthesia with the patient or authorized representative who has indicated his/her understanding and acceptance.     Dental advisory given  Plan Discussed with: CRNA  Anesthesia Plan Comments: (See APP note by Joslyn Hy, FNP )       Anesthesia Quick Evaluation

## 2021-01-22 NOTE — Telephone Encounter (Signed)
    Sean [name] DOB:  November 29, 1970  MRN:  [id number]   Primary Cardiologist: Gypsy Balsam, MD  Chart reviewed as part of pre-operative protocol coverage. Discussed with Dr. Bing Matter, per his recommendations " A stress test showed no significant ischemia and echocardiogram showed preserved ejection fraction. Bicuspid aortic valve without stenosis nor regurgitation should be fine to proceed with surgery as scheduled. In terms of ascending aortic aneurysm 34mm it is still for observation and not for surgery yet. "  Given past medical history and time since last visit, based on ACC/AHA guidelines, Sean [name] would be at acceptable risk for the planned procedure without further cardiovascular testing.   Previous cardiac testing:   Echocardiogram 01/16/21 normal LVEF, bicuspid aortic valve with no regurgitation nor stenosis with moderate dilation aortic root measuring 61mm and severe dilation ascending aorta 49mm.   Steffanie Dunn 01/21/21 at [place] low risk study as previously documented.   CT chest performed today 01/22/21 with report as follows:  IMPRESSION: 1. Ascending aortic aneurysm measuring up to 5.2 cm in diameter. No acute vascular findings on noncontrast imaging. No previous relevant imaging of the thoracic aorta. Recommend semi-annual imaging followup by CTA or MRA and referral to cardiothoracic surgery if not already obtained. This recommendation follows 2010 ACCF/AHA/AATS/ACR/ASA/SCA/SCAI/SIR/STS/SVM Guidelines for the Diagnosis and Management of Patients With Thoracic Aortic Disease. Circulation. 2010; 121: X833-A250. Aortic aneurysm NOS (ICD10-I71.9) 2. Superior segment right lower lobe consolidation suspicious for pneumonia. Correlate clinically and recommend radiographic follow-up in 3-4 weeks to ensure resolution and exclude underlying malignancy. 3. These results will be called to the ordering clinician or representative by the Radiologist Assistant, and  communication documented in the PACS or [employer].  Will coordinate repeat CT chest in 6 months with Dr. Vanetta Shawl nurse for monitoring. CT findings suspicious for pneumonia should be followed up on while in the hospital pending length of admission or with primary care provider.   I will route this recommendation to the requesting party via Epic fax function and remove from pre-op pool. Preop callback team is also notifying nursing team at [facility] office.   Please call with questions.  Alver Sorrow, NP 01/22/2021, 3:22 PM

## 2021-01-22 NOTE — Telephone Encounter (Signed)
Echo has been completed. Checking with scheduling about the lexiscan getting scheduled. Precert sent for ct once approved will get it scheduled.

## 2021-01-22 NOTE — Telephone Encounter (Signed)
Spoke to patient he already had lexiscan at [facility] looks like Gillian Shields, NP was able to locate results. For some reason we couldn't access it. Patient going for ct today. For now looks like nothing else is needed. Hopefully after Ct patient can be cleared. Preop poole if you need me to assist further please let me know.

## 2021-01-22 NOTE — Telephone Encounter (Signed)
Of note, surgery has been rescheduled for 02/06/21.  Alver Sorrow, NP

## 2021-01-22 NOTE — Telephone Encounter (Signed)
Renea Ee is calling needing the stress test results done at [facility] yesterday as well as his Echo results performed in May sent to Dr. Randie Heinz [facility]. She states they are needing the results by 12 PM today so his upcoming procedure does not have to reschedule.

## 2021-01-22 NOTE — Telephone Encounter (Signed)
Received a call from Surgeons' office, Georgianne Fick, RN to Dr. Randie Heinz.  Per pt, pt had stress test yesterday at [facility].  Pt is very upset as he may have to postpone his surgery as he is in excruciating pain.    Called to [facility] office and spoke with Hermenia Bers and confirmed that pt did have his stress test at [facility] and will send a message to Dr. Bing Matter to see if he can read and sign off and see if pt can be cleared, based on stress test.  Hermenia Bers is calling over to [facility] to see if they can do a STAT CTA so we can get them results and see if pt is cleared for surgery.  Left a message for [facility] at [facility] to let her know the update.

## 2021-01-23 ENCOUNTER — Inpatient Hospital Stay (HOSPITAL_COMMUNITY): Payer: BLUE CROSS/BLUE SHIELD | Admitting: Physician Assistant

## 2021-01-23 ENCOUNTER — Telehealth: Payer: Self-pay | Admitting: Emergency Medicine

## 2021-01-23 ENCOUNTER — Encounter (HOSPITAL_COMMUNITY)
Admission: RE | Disposition: A | Discharge status: Home / Self Care | Payer: Self-pay | Source: Home / Self Care | Attending: Vascular Surgery

## 2021-01-23 ENCOUNTER — Encounter (HOSPITAL_COMMUNITY): Payer: Self-pay | Admitting: Vascular Surgery

## 2021-01-23 ENCOUNTER — Inpatient Hospital Stay (HOSPITAL_COMMUNITY)
Admission: RE | Admit: 2021-01-23 | Discharge: 2021-01-27 | DRG: 254 | Disposition: A | Discharge status: Home Health | Payer: BLUE CROSS/BLUE SHIELD | Attending: Vascular Surgery | Admitting: Vascular Surgery

## 2021-01-23 ENCOUNTER — Inpatient Hospital Stay (HOSPITAL_COMMUNITY): Payer: BLUE CROSS/BLUE SHIELD | Admitting: Anesthesiology

## 2021-01-23 ENCOUNTER — Other Ambulatory Visit: Payer: Self-pay

## 2021-01-23 DIAGNOSIS — F17210 Nicotine dependence, cigarettes, uncomplicated: Secondary | ICD-10-CM | POA: Diagnosis present

## 2021-01-23 DIAGNOSIS — Z79899 Other long term (current) drug therapy: Secondary | ICD-10-CM

## 2021-01-23 DIAGNOSIS — I739 Peripheral vascular disease, unspecified: Secondary | ICD-10-CM | POA: Diagnosis present

## 2021-01-23 DIAGNOSIS — Z89512 Acquired absence of left leg below knee: Secondary | ICD-10-CM | POA: Diagnosis not present

## 2021-01-23 DIAGNOSIS — I70211 Atherosclerosis of native arteries of extremities with intermittent claudication, right leg: Secondary | ICD-10-CM | POA: Diagnosis not present

## 2021-01-23 DIAGNOSIS — Z20822 Contact with and (suspected) exposure to covid-19: Secondary | ICD-10-CM | POA: Diagnosis present

## 2021-01-23 DIAGNOSIS — F32A Depression, unspecified: Secondary | ICD-10-CM | POA: Diagnosis present

## 2021-01-23 DIAGNOSIS — Z7902 Long term (current) use of antithrombotics/antiplatelets: Secondary | ICD-10-CM

## 2021-01-23 DIAGNOSIS — R2242 Localized swelling, mass and lump, left lower limb: Secondary | ICD-10-CM | POA: Diagnosis not present

## 2021-01-23 DIAGNOSIS — I712 Thoracic aortic aneurysm, without rupture: Secondary | ICD-10-CM | POA: Diagnosis present

## 2021-01-23 DIAGNOSIS — I719 Aortic aneurysm of unspecified site, without rupture: Secondary | ICD-10-CM

## 2021-01-23 DIAGNOSIS — Z833 Family history of diabetes mellitus: Secondary | ICD-10-CM | POA: Diagnosis not present

## 2021-01-23 DIAGNOSIS — Z7982 Long term (current) use of aspirin: Secondary | ICD-10-CM

## 2021-01-23 DIAGNOSIS — I10 Essential (primary) hypertension: Secondary | ICD-10-CM | POA: Diagnosis present

## 2021-01-23 DIAGNOSIS — I9789 Other postprocedural complications and disorders of the circulatory system, not elsewhere classified: Secondary | ICD-10-CM | POA: Diagnosis not present

## 2021-01-23 DIAGNOSIS — M1990 Unspecified osteoarthritis, unspecified site: Secondary | ICD-10-CM | POA: Diagnosis present

## 2021-01-23 DIAGNOSIS — Z9889 Other specified postprocedural states: Secondary | ICD-10-CM | POA: Diagnosis not present

## 2021-01-23 HISTORY — PX: FEMORAL-POPLITEAL BYPASS GRAFT: SHX937

## 2021-01-23 LAB — CBC
HCT: 41.5 % (ref 39.0–52.0)
Hemoglobin: 13.5 g/dL (ref 13.0–17.0)
MCH: 26.5 pg (ref 26.0–34.0)
MCHC: 32.5 g/dL (ref 30.0–36.0)
MCV: 81.4 fL (ref 80.0–100.0)
Platelets: 276 10*3/uL (ref 150–400)
RBC: 5.1 MIL/uL (ref 4.22–5.81)
RDW: 14.6 % (ref 11.5–15.5)
WBC: 14.5 10*3/uL — ABNORMAL HIGH (ref 4.0–10.5)
nRBC: 0 % (ref 0.0–0.2)

## 2021-01-23 LAB — CREATININE, SERUM
Creatinine, Ser: 1.08 mg/dL (ref 0.61–1.24)
GFR, Estimated: >60 mL/min (ref >60)

## 2021-01-23 SURGERY — BYPASS GRAFT FEMORAL-POPLITEAL ARTERY
Anesthesia: General | Site: Leg Upper | Laterality: Right

## 2021-01-23 MED ORDER — LIDOCAINE 2% (20 MG/ML) 5 ML SYRINGE
INTRAMUSCULAR | Status: AC
  Filled 2021-01-23: qty 5

## 2021-01-23 MED ORDER — METOPROLOL TARTRATE 5 MG/5ML IV SOLN: 2.0000 mg | INTRAVENOUS | Status: DC | PRN

## 2021-01-23 MED ORDER — AMLODIPINE BESYLATE 10 MG PO TABS
10.0000 mg | ORAL_TABLET | Freq: Every day | ORAL | Status: DC
  Administered 2021-01-24 – 2021-01-27 (×4): 10 mg via ORAL
  Filled 2021-01-23 (×4): qty 1

## 2021-01-23 MED ORDER — ACETAMINOPHEN 500 MG PO TABS
1000.0000 mg | ORAL_TABLET | Freq: Once | ORAL | Status: AC
  Administered 2021-01-23: 1000 mg via ORAL
  Filled 2021-01-23: qty 2

## 2021-01-23 MED ORDER — PROPOFOL 10 MG/ML IV BOLUS
INTRAVENOUS | Status: AC
  Filled 2021-01-23: qty 40

## 2021-01-23 MED ORDER — ORAL CARE MOUTH RINSE: 15.0000 mL | Freq: Once | OROMUCOSAL | Status: AC

## 2021-01-23 MED ORDER — FENTANYL CITRATE (PF) 250 MCG/5ML IJ SOLN
INTRAMUSCULAR | Status: DC | PRN
  Administered 2021-01-23: 100 ug via INTRAVENOUS
  Administered 2021-01-23 (×5): 50 ug via INTRAVENOUS

## 2021-01-23 MED ORDER — HYDROCHLOROTHIAZIDE 25 MG PO TABS
25.0000 mg | ORAL_TABLET | Freq: Every day | ORAL | Status: DC
  Administered 2021-01-24 – 2021-01-27 (×4): 25 mg via ORAL
  Filled 2021-01-23 (×4): qty 1

## 2021-01-23 MED ORDER — MIDAZOLAM HCL 5 MG/5ML IJ SOLN
INTRAMUSCULAR | Status: DC | PRN
  Administered 2021-01-23: 2 mg via INTRAVENOUS

## 2021-01-23 MED ORDER — PREGABALIN 100 MG PO CAPS
300.0000 mg | ORAL_CAPSULE | Freq: Every day | ORAL | Status: DC
  Administered 2021-01-24 – 2021-01-27 (×4): 300 mg via ORAL
  Filled 2021-01-23 (×5): qty 3

## 2021-01-23 MED ORDER — PHENOL 1.4 % MT LIQD: 1.0000 | OROMUCOSAL | Status: DC | PRN

## 2021-01-23 MED ORDER — SODIUM CHLORIDE 0.9 % IV SOLN
INTRAVENOUS | Status: DC | PRN
  Administered 2021-01-23: 500 mL

## 2021-01-23 MED ORDER — PANTOPRAZOLE SODIUM 40 MG PO TBEC
40.0000 mg | DELAYED_RELEASE_TABLET | Freq: Every day | ORAL | Status: DC
  Administered 2021-01-23 – 2021-01-27 (×5): 40 mg via ORAL
  Filled 2021-01-23 (×4): qty 1

## 2021-01-23 MED ORDER — DEXAMETHASONE SODIUM PHOSPHATE 10 MG/ML IJ SOLN
INTRAMUSCULAR | Status: AC
  Filled 2021-01-23: qty 1

## 2021-01-23 MED ORDER — ACETAMINOPHEN 650 MG RE SUPP: 325.0000 mg | RECTAL | Status: DC | PRN

## 2021-01-23 MED ORDER — DEXAMETHASONE SODIUM PHOSPHATE 10 MG/ML IJ SOLN
INTRAMUSCULAR | Status: DC | PRN
  Administered 2021-01-23: 5 mg via INTRAVENOUS

## 2021-01-23 MED ORDER — IRBESARTAN 300 MG PO TABS
300.0000 mg | ORAL_TABLET | Freq: Every day | ORAL | Status: DC
  Administered 2021-01-24 – 2021-01-27 (×4): 300 mg via ORAL
  Filled 2021-01-23 (×4): qty 1

## 2021-01-23 MED ORDER — MORPHINE SULFATE (PF) 2 MG/ML IV SOLN: 2.0000 mg | INTRAVENOUS | Status: DC | PRN

## 2021-01-23 MED ORDER — LIDOCAINE 2% (20 MG/ML) 5 ML SYRINGE
INTRAMUSCULAR | Status: DC | PRN
  Administered 2021-01-23: 100 mg via INTRAVENOUS

## 2021-01-23 MED ORDER — PROPOFOL 10 MG/ML IV BOLUS
INTRAVENOUS | Status: DC | PRN
  Administered 2021-01-23: 50 mg via INTRAVENOUS
  Administered 2021-01-23: 150 mg via INTRAVENOUS

## 2021-01-23 MED ORDER — POTASSIUM CHLORIDE CRYS ER 20 MEQ PO TBCR: 20.0000 meq | EXTENDED_RELEASE_TABLET | Freq: Every day | ORAL | Status: DC | PRN

## 2021-01-23 MED ORDER — ALUM & MAG HYDROXIDE-SIMETH 200-200-20 MG/5ML PO SUSP: 15.0000 mL | ORAL | Status: DC | PRN

## 2021-01-23 MED ORDER — PHENYLEPHRINE 40 MCG/ML (10ML) SYRINGE FOR IV PUSH (FOR BLOOD PRESSURE SUPPORT)
PREFILLED_SYRINGE | INTRAVENOUS | Status: AC
  Filled 2021-01-23: qty 10

## 2021-01-23 MED ORDER — PROTAMINE SULFATE 10 MG/ML IV SOLN
INTRAVENOUS | Status: DC | PRN
  Administered 2021-01-23: 25 mg via INTRAVENOUS

## 2021-01-23 MED ORDER — ROSUVASTATIN CALCIUM 20 MG PO TABS
20.0000 mg | ORAL_TABLET | Freq: Every day | ORAL | Status: DC
  Administered 2021-01-23 – 2021-01-27 (×5): 20 mg via ORAL
  Filled 2021-01-23 (×5): qty 1

## 2021-01-23 MED ORDER — ALBUMIN HUMAN 5 % IV SOLN: INTRAVENOUS | Status: DC | PRN

## 2021-01-23 MED ORDER — SUGAMMADEX SODIUM 200 MG/2ML IV SOLN
INTRAVENOUS | Status: DC | PRN
  Administered 2021-01-23: 400 mg via INTRAVENOUS

## 2021-01-23 MED ORDER — HEPARIN SODIUM (PORCINE) 5000 UNIT/ML IJ SOLN
5000.0000 [IU] | Freq: Three times a day (TID) | INTRAMUSCULAR | Status: DC
  Administered 2021-01-24 – 2021-01-27 (×10): 5000 [IU] via SUBCUTANEOUS
  Filled 2021-01-23 (×10): qty 1

## 2021-01-23 MED ORDER — OLMESARTAN MEDOXOMIL-HCTZ 40-25 MG PO TABS: 1.0000 | ORAL_TABLET | Freq: Every day | ORAL | Status: DC

## 2021-01-23 MED ORDER — SODIUM CHLORIDE 0.9 % IV SOLN: 500.0000 mL | Freq: Once | INTRAVENOUS | Status: DC | PRN

## 2021-01-23 MED ORDER — FENTANYL CITRATE (PF) 250 MCG/5ML IJ SOLN
INTRAMUSCULAR | Status: AC
  Filled 2021-01-23: qty 5

## 2021-01-23 MED ORDER — CEFAZOLIN IN SODIUM CHLORIDE 3-0.9 GM/100ML-% IV SOLN
3.0000 g | INTRAVENOUS | Status: AC
  Administered 2021-01-23: 3 g via INTRAVENOUS
  Filled 2021-01-23: qty 100

## 2021-01-23 MED ORDER — SODIUM CHLORIDE 0.9 % IV SOLN: INTRAVENOUS | Status: AC

## 2021-01-23 MED ORDER — ACETAMINOPHEN 325 MG PO TABS
325.0000 mg | ORAL_TABLET | ORAL | Status: DC | PRN
Start: 2021-01-23 — End: 2021-01-27

## 2021-01-23 MED ORDER — ONDANSETRON HCL 4 MG/2ML IJ SOLN
INTRAMUSCULAR | Status: DC | PRN
  Administered 2021-01-23: 4 mg via INTRAVENOUS

## 2021-01-23 MED ORDER — HEPARIN SODIUM (PORCINE) 1000 UNIT/ML IJ SOLN
INTRAMUSCULAR | Status: DC | PRN
  Administered 2021-01-23: 10000 [IU] via INTRAVENOUS
  Administered 2021-01-23: 5000 [IU] via INTRAVENOUS

## 2021-01-23 MED ORDER — SODIUM CHLORIDE 0.9 % IV SOLN: INTRAVENOUS | Status: DC

## 2021-01-23 MED ORDER — MAGNESIUM SULFATE 2 GM/50ML IV SOLN: 2.0000 g | Freq: Every day | INTRAVENOUS | Status: DC | PRN

## 2021-01-23 MED ORDER — DULOXETINE HCL 30 MG PO CPEP
30.0000 mg | ORAL_CAPSULE | Freq: Every day | ORAL | Status: DC
  Administered 2021-01-24 – 2021-01-27 (×4): 30 mg via ORAL
  Filled 2021-01-23 (×4): qty 1

## 2021-01-23 MED ORDER — OXYCODONE-ACETAMINOPHEN 5-325 MG PO TABS
1.0000 | ORAL_TABLET | ORAL | Status: DC | PRN
  Administered 2021-01-23 (×2): 1 via ORAL
  Administered 2021-01-24 – 2021-01-27 (×9): 2 via ORAL
  Filled 2021-01-23 (×5): qty 2
  Filled 2021-01-23: qty 1
  Filled 2021-01-23 (×5): qty 2

## 2021-01-23 MED ORDER — LACTATED RINGERS IV SOLN: INTRAVENOUS | Status: DC

## 2021-01-23 MED ORDER — ASPIRIN EC 81 MG PO TBEC
81.0000 mg | DELAYED_RELEASE_TABLET | Freq: Every day | ORAL | Status: DC
  Administered 2021-01-24 – 2021-01-27 (×4): 81 mg via ORAL
  Filled 2021-01-23 (×4): qty 1

## 2021-01-23 MED ORDER — CHLORHEXIDINE GLUCONATE CLOTH 2 % EX PADS: 6.0000 | MEDICATED_PAD | Freq: Once | CUTANEOUS | Status: DC

## 2021-01-23 MED ORDER — DOCUSATE SODIUM 100 MG PO CAPS
100.0000 mg | ORAL_CAPSULE | Freq: Every day | ORAL | Status: DC
  Administered 2021-01-24 – 2021-01-27 (×4): 100 mg via ORAL
  Filled 2021-01-23 (×4): qty 1

## 2021-01-23 MED ORDER — LABETALOL HCL 5 MG/ML IV SOLN
10.0000 mg | INTRAVENOUS | Status: DC | PRN
  Administered 2021-01-23: 10 mg via INTRAVENOUS
  Filled 2021-01-23: qty 4

## 2021-01-23 MED ORDER — CEFAZOLIN SODIUM-DEXTROSE 2-4 GM/100ML-% IV SOLN
2.0000 g | Freq: Three times a day (TID) | INTRAVENOUS | Status: AC
  Administered 2021-01-23 – 2021-01-24 (×2): 2 g via INTRAVENOUS
  Filled 2021-01-23 (×2): qty 100

## 2021-01-23 MED ORDER — ONDANSETRON HCL 4 MG/2ML IJ SOLN
INTRAMUSCULAR | Status: AC
  Filled 2021-01-23: qty 2

## 2021-01-23 MED ORDER — LACTATED RINGERS IV SOLN: INTRAVENOUS | Status: DC | PRN

## 2021-01-23 MED ORDER — GUAIFENESIN-DM 100-10 MG/5ML PO SYRP: 15.0000 mL | ORAL_SOLUTION | ORAL | Status: DC | PRN

## 2021-01-23 MED ORDER — CHLORHEXIDINE GLUCONATE 0.12 % MT SOLN
15.0000 mL | Freq: Once | OROMUCOSAL | Status: AC
  Administered 2021-01-23: 15 mL via OROMUCOSAL
  Filled 2021-01-23: qty 15

## 2021-01-23 MED ORDER — PHENYLEPHRINE 40 MCG/ML (10ML) SYRINGE FOR IV PUSH (FOR BLOOD PRESSURE SUPPORT)
PREFILLED_SYRINGE | INTRAVENOUS | Status: DC | PRN
  Administered 2021-01-23 (×4): 80 ug via INTRAVENOUS

## 2021-01-23 MED ORDER — PHENYLEPHRINE HCL-NACL 10-0.9 MG/250ML-% IV SOLN
INTRAVENOUS | Status: DC | PRN
  Administered 2021-01-23: 40 ug/min via INTRAVENOUS

## 2021-01-23 MED ORDER — HYDRALAZINE HCL 20 MG/ML IJ SOLN: 5.0000 mg | INTRAMUSCULAR | Status: DC | PRN

## 2021-01-23 MED ORDER — CLOPIDOGREL BISULFATE 75 MG PO TABS
75.0000 mg | ORAL_TABLET | Freq: Every day | ORAL | Status: DC
  Administered 2021-01-24 – 2021-01-27 (×4): 75 mg via ORAL
  Filled 2021-01-23 (×4): qty 1

## 2021-01-23 MED ORDER — ROCURONIUM BROMIDE 10 MG/ML (PF) SYRINGE
PREFILLED_SYRINGE | INTRAVENOUS | Status: AC
  Filled 2021-01-23: qty 10

## 2021-01-23 MED ORDER — HEPARIN SODIUM (PORCINE) 1000 UNIT/ML IJ SOLN
INTRAMUSCULAR | Status: AC
  Filled 2021-01-23: qty 2

## 2021-01-23 MED ORDER — 0.9 % SODIUM CHLORIDE (POUR BTL) OPTIME
TOPICAL | Status: DC | PRN
  Administered 2021-01-23 (×2): 1000 mL

## 2021-01-23 MED ORDER — HYDRALAZINE HCL 10 MG PO TABS
10.0000 mg | ORAL_TABLET | Freq: Two times a day (BID) | ORAL | Status: DC
  Administered 2021-01-23 – 2021-01-27 (×8): 10 mg via ORAL
  Filled 2021-01-23 (×8): qty 1

## 2021-01-23 MED ORDER — ONDANSETRON HCL 4 MG/2ML IJ SOLN: 4.0000 mg | Freq: Four times a day (QID) | INTRAMUSCULAR | Status: DC | PRN

## 2021-01-23 MED ORDER — MIDAZOLAM HCL 2 MG/2ML IJ SOLN
INTRAMUSCULAR | Status: AC
  Filled 2021-01-23: qty 2

## 2021-01-23 MED ORDER — ROCURONIUM BROMIDE 10 MG/ML (PF) SYRINGE
PREFILLED_SYRINGE | INTRAVENOUS | Status: DC | PRN
  Administered 2021-01-23: 60 mg via INTRAVENOUS
  Administered 2021-01-23: 20 mg via INTRAVENOUS
  Administered 2021-01-23: 50 mg via INTRAVENOUS
  Administered 2021-01-23 (×2): 20 mg via INTRAVENOUS

## 2021-01-23 SURGICAL SUPPLY — 51 items
BANDAGE ESMARK 6X9 LF (GAUZE/BANDAGES/DRESSINGS) IMPLANT
BNDG ESMARK 6X9 LF (GAUZE/BANDAGES/DRESSINGS)
CANISTER SUCT 3000ML PPV (MISCELLANEOUS) ×3 IMPLANT
CANNULA VESSEL 3MM 2 BLNT TIP (CANNULA) IMPLANT
CLIP LIGATING EXTRA MED SLVR (CLIP) ×3 IMPLANT
CLIP LIGATING EXTRA SM BLUE (MISCELLANEOUS) ×3 IMPLANT
CUFF TOURN SGL QUICK 24 (TOURNIQUET CUFF)
CUFF TOURN SGL QUICK 34 (TOURNIQUET CUFF)
CUFF TOURN SGL QUICK 42 (TOURNIQUET CUFF) IMPLANT
CUFF TRNQT CYL 24X4X16.5-23 (TOURNIQUET CUFF) IMPLANT
CUFF TRNQT CYL 34X4.125X (TOURNIQUET CUFF) IMPLANT
DERMABOND ADVANCED (GAUZE/BANDAGES/DRESSINGS) ×2
DERMABOND ADVANCED .7 DNX12 (GAUZE/BANDAGES/DRESSINGS) ×1 IMPLANT
DRAPE C-ARM 42X72 X-RAY (DRAPES) IMPLANT
DRAPE HALF SHEET 40X57 (DRAPES) IMPLANT
DRAPE X-RAY CASS 24X20 (DRAPES) IMPLANT
ELECT REM PT RETURN 9FT ADLT (ELECTROSURGICAL) ×3
ELECTRODE REM PT RTRN 9FT ADLT (ELECTROSURGICAL) ×1 IMPLANT
GLOVE BIO SURGEON STRL SZ7.5 (GLOVE) ×3 IMPLANT
GOWN STRL REUS W/ TWL LRG LVL3 (GOWN DISPOSABLE) ×2 IMPLANT
GOWN STRL REUS W/ TWL XL LVL3 (GOWN DISPOSABLE) ×1 IMPLANT
GOWN STRL REUS W/TWL LRG LVL3 (GOWN DISPOSABLE) ×4
GOWN STRL REUS W/TWL XL LVL3 (GOWN DISPOSABLE) ×2
HEMOSTAT SNOW SURGICEL 2X4 (HEMOSTASIS) IMPLANT
INSERT FOGARTY SM (MISCELLANEOUS) IMPLANT
KIT BASIN OR (CUSTOM PROCEDURE TRAY) ×3 IMPLANT
KIT TURNOVER KIT B (KITS) ×3 IMPLANT
MARKER GRAFT CORONARY BYPASS (MISCELLANEOUS) IMPLANT
NS IRRIG 1000ML POUR BTL (IV SOLUTION) ×6 IMPLANT
PACK PERIPHERAL VASCULAR (CUSTOM PROCEDURE TRAY) ×3 IMPLANT
PAD ARMBOARD 7.5X6 YLW CONV (MISCELLANEOUS) ×6 IMPLANT
SPONGE LAP 18X18 RF (DISPOSABLE) ×3 IMPLANT
STOPCOCK 4 WAY LG BORE MALE ST (IV SETS) IMPLANT
SUT ETHILON 3 0 PS 1 (SUTURE) IMPLANT
SUT MNCRL AB 4-0 PS2 18 (SUTURE) ×9 IMPLANT
SUT PROLENE 5 0 C 1 24 (SUTURE) ×6 IMPLANT
SUT PROLENE 6 0 BV (SUTURE) ×12 IMPLANT
SUT PROLENE 7 0 BV 1 (SUTURE) IMPLANT
SUT SILK 2 0 SH (SUTURE) ×3 IMPLANT
SUT SILK 3 0 (SUTURE)
SUT SILK 3-0 18XBRD TIE 12 (SUTURE) IMPLANT
SUT VIC AB 2-0 CT1 27 (SUTURE) ×4
SUT VIC AB 2-0 CT1 TAPERPNT 27 (SUTURE) ×2 IMPLANT
SUT VIC AB 2-0 SH 27 (SUTURE) ×2
SUT VIC AB 2-0 SH 27XBRD (SUTURE) ×1 IMPLANT
SUT VIC AB 3-0 SH 27 (SUTURE) ×6
SUT VIC AB 3-0 SH 27X BRD (SUTURE) ×3 IMPLANT
TOWEL GREEN STERILE (TOWEL DISPOSABLE) ×3 IMPLANT
TRAY FOLEY MTR SLVR 16FR STAT (SET/KITS/TRAYS/PACK) ×3 IMPLANT
UNDERPAD 30X36 HEAVY ABSORB (UNDERPADS AND DIAPERS) ×3 IMPLANT
WATER STERILE IRR 1000ML POUR (IV SOLUTION) ×3 IMPLANT

## 2021-01-23 NOTE — H&P (Signed)
HPI: Sean [name] is a 50 y.o. (1971/05/31) male who presents for evaluation of PAD.  Surgical history significant for left above-the-knee popliteal to posterior tibial artery bypass with vein and 4 compartment fasciotomies due to acute limb ischemia by Dr. Randie Heinz on 09/21/2018.  He underwent thrombolysis of left lower extremity bypass by Dr. Chestine Spore on 04/28/2019.  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 Randie Heinz on 05/09/2019.  He was converted to a left above-the-knee amputation by Dr. Edilia Bo on 01/30/2020.  He is now ambulatory with a prosthetic and continues to have physical therapy twice a week.  Since becoming ambulatory he is noticing short distance claudication with right calf pain.  He states that this is lifestyle limiting and would like to pursue any procedure that would improve his symptoms.  He denies any rest pain or nonhealing wounds of right lower extremity.  He is on aspirin, Plavix, statin daily.  He is a tobacco smoker.      Past Medical History:  Diagnosis Date  . Hypertension   . PVD (peripheral vascular disease) (HCC)          Past Surgical History:  Procedure Laterality Date  . ABDOMINAL AORTOGRAM W/LOWER EXTREMITY N/A 05/08/2019   Procedure: ABDOMINAL AORTOGRAM W/LOWER EXTREMITY;  Surgeon: Maeola Harman, MD;  Location: [facility] INVASIVE CV LAB;  Service: Cardiovascular;  Laterality: N/A;  . AMPUTATION Left 01/30/2020   Procedure: LEFT ABOVE KNEE AMPUTATION;  Surgeon: Chuck Hint, MD;  Location: [facility] OR;  Service: Vascular;  Laterality: Left;  . ANGIOPLASTY Left 04/27/2019   Procedure: Balloon Angioplasty of left posterior tibial artery;  Surgeon: Maeola Harman, MD;  Location: [facility] OR;  Service: Vascular;  Laterality: Left;  . EMBOLECTOMY Left 09/21/2018   Procedure: THROMBECTOMY LEFT POPLITEAL ARTERY BYPASS LEFT POPLITEAL TO TIBIAL ARTERY WITH SAPHENOUS VEIN HARVEST - FASCIOTOMY;  Surgeon: Chuck Hint, MD;  Location: [facility]  OR;  Service: Vascular;  Laterality: Left;  . finger surgery Right    small finger  (2 different surgeries)  . LOWER EXTREMITY ANGIOGRAM Left 04/27/2019   Procedure: LEFT LOWER EXTREMITY ANGIOGRAM;  Surgeon: Maeola Harman, MD;  Location: [facility] OR;  Service: Vascular;  Laterality: Left;  . PERIPHERAL VASCULAR BALLOON ANGIOPLASTY Left 05/08/2019   Procedure: PERIPHERAL VASCULAR BALLOON ANGIOPLASTY;  Surgeon: Maeola Harman, MD;  Location: [facility] INVASIVE CV LAB;  Service: Cardiovascular;  Laterality: Left;  Posterior tibial  . PERIPHERAL VASCULAR INTERVENTION Left 04/28/2019   Procedure: PERIPHERAL VASCULAR INTERVENTION;  Surgeon: Cephus Shelling, MD;  Location: [facility] INVASIVE CV LAB;  Service: Cardiovascular;  Laterality: Left;  . PERIPHERAL VASCULAR THROMBECTOMY Bilateral 04/28/2019   Procedure: PERIPHERAL VASCULAR THROMBECTOMY;  Surgeon: Cephus Shelling, MD;  Location: [facility] INVASIVE CV LAB;  Service: Cardiovascular;  Laterality: Bilateral;  . PERIPHERAL VASCULAR THROMBECTOMY Left 05/08/2019   Procedure: PERIPHERAL VASCULAR THROMBECTOMY;  Surgeon: Maeola Harman, MD;  Location: [facility] INVASIVE CV LAB;  Service: Cardiovascular;  Laterality: Left;  posterior tibial  . TRANSMETATARSAL AMPUTATION Left 05/09/2019   Procedure: Left Below Knee Amputation;  Surgeon: Maeola Harman, MD;  Location: [facility] OR;  Service: Vascular;  Laterality: Left;    Social History        Socioeconomic History  . Marital status: Married    Spouse name: Not on file  . Number of children: Not on file  . Years of education: Not on file  . Highest education level: Not on file  Occupational History  . Not on file  Tobacco Use  . Smoking status: Current Every Day Smoker    Packs/day: 1.00    Types: Cigarettes  . Smokeless tobacco: Never Used  Vaping Use  . Vaping Use: Never used  Substance and Sexual Activity  . Alcohol use: Never  . Drug use: Never  . Sexual activity:  Not on file  Other Topics Concern  . Not on file  Social History Narrative  . Not on file   Social Determinants of Health   Financial Resource Strain: Not on file  Food Insecurity: Not on file  Transportation Needs: Not on file  Physical Activity: Not on file  Stress: Not on file  Social Connections: Not on file  Intimate Partner Violence: Not on file         Family History  Problem Relation Age of Onset  . Diabetes Father           Current Outpatient Medications  Medication Sig Dispense Refill  . Adalimumab (HUMIRA PEN) 40 MG/0.4ML PNKT Inject 40 mg into the skin every 14 (fourteen) days.    Marland Kitchen amLODipine (NORVASC) 10 MG tablet Take 10 mg by mouth daily.    Marland Kitchen aspirin EC 81 MG tablet Take 81 mg by mouth daily.    . Buprenorphine HCl (BELBUCA) 300 MCG FILM Place inside cheek.    . clobetasol ointment (TEMOVATE) 0.05 % SMARTSIG:1 Topical Every Night    . clopidogrel (PLAVIX) 75 MG tablet Take 1 tablet (75 mg total) by mouth daily with breakfast. 30 tablet 2  . DULoxetine (CYMBALTA) 30 MG capsule Take 30 mg by mouth daily.     Marland Kitchen lisinopril-hydrochlorothiazide (ZESTORETIC) 20-12.5 MG tablet Take 2 tablets by mouth daily.     . potassium chloride SA (KLOR-CON) 20 MEQ tablet Take 20 mEq by mouth daily.    . pregabalin (LYRICA) 100 MG capsule Take 100 mg by mouth 2 (two) times daily.    . rosuvastatin (CRESTOR) 10 MG tablet TAKE 1 TABLET BY MOUTH EVERY DAY 90 tablet 3  . acetaminophen (TYLENOL) 500 MG tablet Take 1,000 mg by mouth every 6 (six) hours as needed. (Patient not taking: Reported on 12/05/2020)    . phentermine (ADIPEX-P) 37.5 MG tablet Take by mouth.    . sildenafil (REVATIO) 20 MG tablet Take 80 mg by mouth as needed (sexual activity).  (Patient not taking: Reported on 12/05/2020)     No current facility-administered medications for this visit.    No Known Allergies   REVIEW OF SYSTEMS:   [X]  denotes positive finding, [ ]   denotes negative finding Cardiac  Comments:  Chest pain or chest pressure:    Shortness of breath upon exertion:    Short of breath when lying flat:    Irregular heart rhythm:        Vascular    Pain in calf, thigh, or hip brought on by ambulation:    Pain in feet at night that wakes you up from your sleep:     Blood clot in your veins:    Leg swelling:         Pulmonary    Oxygen at home:    Productive cough:     Wheezing:         Neurologic    Sudden weakness in arms or legs:     Sudden numbness in arms or legs:     Sudden onset of difficulty speaking or slurred speech:    Temporary loss of vision in one eye:  Problems with dizziness:         Gastrointestinal    Blood in stool:     Vomited blood:         Genitourinary    Burning when urinating:     Blood in urine:        Psychiatric    Major depression:         Hematologic    Bleeding problems:    Problems with blood clotting too easily:        Skin    Rashes or ulcers:        Constitutional    Fever or chills:      PHYSICAL EXAMINATION:  Vitals:   01/23/21 1054 01/23/21 1103  BP: (!) 137/118 (!) 153/99  Pulse: 87   Resp: 18   Temp: 98.7 F (37.1 C)   SpO2: 99%      General:  WDWN in NAD; vital signs documented above Gait: Not observed HENT: WNL, normocephalic Pulmonary: normal non-labored breathing  Cardiac: regular HR Abdomen: soft, NT, no masses Skin: without rashes Vascular Exam/Pulses:  Right Left  Femoral 2+ (normal) 2+ (normal)  DP absent AKA  PT absent AKA   Extremities: without ischemic changes, without Gangrene , without cellulitis; without open wounds;  Musculoskeletal: no muscle wasting or atrophy       Neurologic: A&O X 3;  No focal weakness or paresthesias are detected Psychiatric:  The pt has Normal affect.   Non-Invasive Vascular Imaging:   ABI/TBIToday's  ABIToday's TBIPrevious ABIPrevious TBI  +-------+-----------+-----------+------------+------------+  Right 0.61    absent   0.72    0.26      +-------+-----------+-----------+------------+------------+    ASSESSMENT/PLAN:: 50 y.o. male with PAD.  History of left lower extremity bypass eventually requiring amputation  -Left AKA well-healed and patient is ambulatory with a prosthetic -He is complaining of lifestyle limiting claudication of right lower extremity with known infrainguinal occlusive disease and ABI today of 0.6 with an absent TBI -Plan right sfa-tp trunk bypass  Joelyn Lover C. Randie Heinz, MD [facility] [place] Office: [phone] Pager: [phone]

## 2021-01-23 NOTE — Anesthesia Procedure Notes (Addendum)
Procedure Name: Intubation Date/Time: 01/23/2021 11:58 AM Performed by: Harden Mo, CRNA Pre-anesthesia Checklist: Patient identified, Emergency Drugs available, Suction available and Patient being monitored Patient Re-evaluated:Patient Re-evaluated prior to induction Oxygen Delivery Method: Circle System Utilized Preoxygenation: Pre-oxygenation with 100% oxygen Induction Type: IV induction Ventilation: Mask ventilation without difficulty Laryngoscope Size: Mac and 4 Grade View: Grade II Tube type: Oral Tube size: 8.0 mm Number of attempts: 1 Airway Equipment and Method: Stylet and Oral airway Placement Confirmation: ETT inserted through vocal cords under direct vision,  positive ETCO2 and breath sounds checked- equal and bilateral Secured at: 24 cm Tube secured with: Tape Dental Injury: Teeth and Oropharynx as per pre-operative assessment  Comments: By Rexford Maus, SRNA

## 2021-01-23 NOTE — Op Note (Signed)
Patient name: Sean [name] MRN: [id number] DOB: September 17, 1970 Sex: male  01/23/2021 Pre-operative Diagnosis: right lower extremity short distance claudication Post-operative diagnosis:  Same Surgeon:  Luanna Salk. Randie Heinz, MD Assistant: Aggie Moats, PA Procedure Performed: 1.  Harvest right greater saphenous vein 2.  Right SFA to tibioperoneal trunk bypass with reversed, ipsilateral, translocated greater saphenous vein   Indications: 50 year old male with a left lower extremity amputation.  He has short distance claudication on the right with decreased ABI and identifiable occluded popliteal artery with reconstitution of his distal tibioperoneal trunk.  He is subsequently indicated for bypass.  An assistant was necessary to expedite the case.  Findings: The distal SFA was patent and very large.  The tibioperoneal trunk was heavily calcified we performed endarterectomy of the tibioperoneal trunk into the peroneal artery and posterior tibial artery.  The vein itself measured approximately 4 mm in diameter throughout and it was reversed.  At completion there was a strong posterior tibial signal at the ankle that was graft dependent.   Procedure:  The patient was identified in the holding area and taken to the operating was placed supine operative table and general anesthesia was induced.  He was sterilely prepped and draped in the right lower extremity usual fashion, antibiotics were minister timeout was called.  Ultrasound was used to identify the saphenous vein his right leg.  We made an incision above the knee we dissected down through the 2 muscle bellies to the neurovascular bundle.  We identified the SFA we placed a vessel loop around this.  We then made incision below the knee dissected down through the skin subcutaneous tissue took down the soleus bridge.  Identified the popliteal artery and vein.  We identified the tibioperoneal vein and it was paired in the medial vein was ligated.  Identify the  tibioperoneal trunk.  We dissected down to the bifurcation isolated the peroneal and posterior tibial arteries.  Through the same incision we began dissecting out the greater saphenous vein.  We dissected up to the other incision.  We made a counterincision higher in the thigh dissected out the entirety of the vein.  We transected proximally distally and tied it off.  We then prepared the vein on the back table.  We created a tunnel between the above-knee and below-knee popliteal incisions.  Patient was fully heparinized.  We clamped the SFA distally proximally opened longitudinally.  We did not perform any endarterectomy there.  We did reverse the vein we spatulated sewn into side with 5-0 Prolene suture.  Upon completion we released our clamps.  We had very strong bleeding through our graft.  We then tunneled maintaining orientation.  We clamped our peroneal and posterior tibial arteries followed by her popliteal.  We opened this longitudinally was heavily calcified we did have to perform endarterectomy up into the popliteal down into the posterior tibial artery.  We had good backbleeding from the peroneal artery was the best.  We then straighten the leg trimmed the graft to size and sewed it into side with 6-0 Prolene suture.  Prior completion of flushing all directions.  Upon completion we had good signal in the peroneal artery within the wound bed the best signal at the ankle was the posterior tibial artery and this was graft dependent.  We administered 50 mg of protamine.  We obtain hemostasis in the wounds and irrigated.  We closed in layers of Vicryl Monocryl.  Dermabond was placed at the level of skin.  He was awakened  from anesthesia having tolerated procedure well without any complication.  All counts were correct at completion  EBL: 250cc   Wyndi Northrup C. Randie Heinz, MD [facility] [place] Office: [phone] Pager: [phone]

## 2021-01-23 NOTE — Anesthesia Postprocedure Evaluation (Signed)
Anesthesia Post Note  Patient: Sean [name]  Procedure(s) Performed: RIGHT LOWER EXTREMITY FEMORAL-POPLITEAL ARTERY BYPASS (Right Leg Upper)     Patient location during evaluation: PACU Anesthesia Type: General Level of consciousness: awake and alert Pain management: pain level controlled Vital Signs Assessment: post-procedure vital signs reviewed and stable Respiratory status: spontaneous breathing, nonlabored ventilation, respiratory function stable and patient connected to nasal cannula oxygen Cardiovascular status: blood pressure returned to baseline and stable Postop Assessment: no apparent nausea or vomiting Anesthetic complications: no   No complications documented.  Last Vitals:  Vitals:   01/23/21 1642 01/23/21 1700  BP: 135/85   Pulse: 78   Resp: 16   Temp: 36.9 C 36.6 C  SpO2: 97%     Last Pain:  Vitals:   01/23/21 1814  TempSrc:   PainSc: 4                  Cecile Hearing

## 2021-01-23 NOTE — Progress Notes (Signed)
Pt arrived from PACU, VSS, oriented to unit, telebox 4E01, CHG complete. Will continue to monitor.   Kalman Jewels, RN

## 2021-01-23 NOTE — Progress Notes (Signed)
Pt BP was 169/94, gave one dose of Labetalol, BP now 155/94.  Kalman Jewels, RN

## 2021-01-23 NOTE — Telephone Encounter (Signed)
Called patient informed him of CT results. Vascular surgery referral placed patient aware they will call him.   Regarding pneumonia, patient has not symptoms. No cough, shortness of breath, drainage or symptoms. Will check with Dr. Bing Matter on how to proceed regarding that.

## 2021-01-23 NOTE — Telephone Encounter (Signed)
Left message for patient to return call.

## 2021-01-23 NOTE — Telephone Encounter (Signed)
Clearance notes have been faxed to surgeon's office.

## 2021-01-23 NOTE — TOC Initial Note (Signed)
Transition of Care [facility]) - Initial/Assessment Note    Patient Details  Name: Sean [name] MRN: [id number] Date of Birth: [birth year]  Transition of Care [facility]) CM/SW Contact:    Lockie Pares, RN Phone Number: 01/23/2021, 5:48 PM  Clinical Narrative:                 49 year old with LBKA now with claudication and 0.8 ABI for bypass surgery  Of Right. Patient is a smoker, lives with wife. Has prosthetic for ambulation . Look at tobacco cessation  Patient is on adipex Weighs 140 KG)  May need [facility] PT OT for reconditioning. CM will follow  Expected Discharge Plan: Home/Self Care Barriers to Discharge: Continued Medical Work up   Patient Goals and CMS Choice        Expected Discharge Plan and Services Expected Discharge Plan: Home/Self Care   Discharge Planning Services: CM Consult   Living arrangements for the past 2 months: Single Family Home                                      Prior Living Arrangements/Services Living arrangements for the past 2 months: Single Family Home Lives with:: Spouse Patient language and need for interpreter reviewed:: Yes        Need for Family Participation in Patient Care: Yes (Comment) Care giver support system in place?: Yes (comment) Current home services:  (prosthesis) Criminal Activity/Legal Involvement Pertinent to Current Situation/Hospitalization: No - Comment as needed  Activities of Daily Living      Permission Sought/Granted                  Emotional Assessment       Orientation: : Oriented to Situation,Oriented to  Time,Oriented to Place,Oriented to Self Alcohol / Substance Use: Not Applicable Psych Involvement: No (comment)  Admission diagnosis:  PAD (peripheral artery disease) (HCC) [I73.9] Patient Active Problem List   Diagnosis Date Noted  . Dyslipidemia 01/15/2021  . Hypertension   . Class 2 severe obesity due to excess calories with serious comorbidity and body mass index (BMI) of 35.0 to 35.9  in adult (HCC) 12/10/2020  . Tobacco use disorder 12/10/2020  . Urinary urgency 12/10/2020  . Weak urinary stream 12/10/2020  . Preop cardiovascular exam 12/05/2020  . Other long term (current) drug therapy 12/05/2020  . Tinea cruris 12/05/2020  . Erectile dysfunction due to diseases classified elsewhere 04/06/2020  . 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 Chronic pain syndrome 02/13/2020  . Neuropathic pain 11/10/2019  . Phantom limb syndrome with pain (HCC) 09/12/2019  . History of left below knee amputation (HCC) 05/16/2019  . Impaired mobility and ADLs 05/16/2019  . Ischemia of left lower extremity 04/28/2019  . PAD (peripheral artery disease) (HCC) 04/28/2019  . Ischemia of extremity 09/21/2018  . PVD (peripheral vascular disease) (HCC) 09/20/2018  . Acquired deformity of right hand 11/27/2015  . Essential hypertension 11/27/2015  . Arthritis 05/06/2014  . Carpal tunnel syndrome 05/06/2014  . Dislocation of distal interphalangeal (DIP) joint of finger 05/06/2014  . 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 [place] [address] Phone: [phone] Fax: [phone]  [facility] [address] [place] [address] Phone: [phone] Fax: [phone]  [facility] -  [place], [place] - [address] [place] [address] Phone: [phone] Fax: [phone]     Social Determinants of Health (SDOH) Interventions    Readmission Risk Interventions No flowsheet data found.

## 2021-01-23 NOTE — Transfer of Care (Signed)
Immediate Anesthesia Transfer of Care Note  Patient: Sean [name]  Procedure(s) Performed: RIGHT LOWER EXTREMITY FEMORAL-POPLITEAL ARTERY BYPASS (Right Leg Upper)  Patient Location: PACU  Anesthesia Type:General  Level of Consciousness: awake, alert  and oriented  Airway & Oxygen Therapy: Patient Spontanous Breathing and Patient connected to face mask oxygen  Post-op Assessment: Report given to RN and Post -op Vital signs reviewed and stable  Post vital signs: Reviewed and stable  Last Vitals:  Vitals Value Taken Time  BP 140/88 01/23/21 1556  Temp    Pulse 90 01/23/21 1558  Resp 15 01/23/21 1558  SpO2 98 % 01/23/21 1558  Vitals shown include unvalidated device data.  Last Pain:  Vitals:   01/23/21 1054  TempSrc: Oral  PainSc:          Complications: No complications documented.

## 2021-01-24 ENCOUNTER — Encounter (HOSPITAL_COMMUNITY): Payer: Self-pay | Admitting: Vascular Surgery

## 2021-01-24 LAB — CBC
HCT: 38.4 % — ABNORMAL LOW (ref 39.0–52.0)
Hemoglobin: 12.8 g/dL — ABNORMAL LOW (ref 13.0–17.0)
MCH: 26.3 pg (ref 26.0–34.0)
MCHC: 33.3 g/dL (ref 30.0–36.0)
MCV: 79 fL — ABNORMAL LOW (ref 80.0–100.0)
Platelets: 276 10*3/uL (ref 150–400)
RBC: 4.86 MIL/uL (ref 4.22–5.81)
RDW: 14.4 % (ref 11.5–15.5)
WBC: 12.5 10*3/uL — ABNORMAL HIGH (ref 4.0–10.5)
nRBC: 0 % (ref 0.0–0.2)

## 2021-01-24 LAB — BASIC METABOLIC PANEL
Anion gap: 10 (ref 5–15)
BUN: 9 mg/dL (ref 6–20)
CO2: 24 mmol/L (ref 22–32)
Calcium: 8.9 mg/dL (ref 8.9–10.3)
Chloride: 105 mmol/L (ref 98–111)
Creatinine, Ser: 0.98 mg/dL (ref 0.61–1.24)
GFR, Estimated: >60 mL/min (ref >60)
Glucose, Bld: 178 mg/dL — ABNORMAL HIGH (ref 70–99)
Potassium: 3.7 mmol/L (ref 3.5–5.1)
Sodium: 139 mmol/L (ref 135–145)

## 2021-01-24 NOTE — Progress Notes (Addendum)
  Progress Note    01/24/2021 7:28 AM 1 Day Post-Op  Subjective:  Says his leg is stiff.  Otherwise, not too painful  Tm 99.4 now afebrile HR 80's-90's NSR 120's-150's systolic 95% RA  Vitals:   01/23/21 2327 01/24/21 0545  BP: (!) 144/84 129/82  Pulse: 93 88  Resp: 20 18  Temp: 99.1 F (37.3 C) 98.8 F (37.1 C)  SpO2: 95% 95%    Physical Exam: Cardiac:  regular Lungs:  Non labored Incisions:  All incisions look good Extremities:  Brisk right PT/peroneal > PT; right calf is soft and mildly tender; anterior compartment is soft and non-tender  CBC    Component Value Date/Time   WBC 12.5 (H) 01/24/2021 0205   RBC 4.86 01/24/2021 0205   HGB 12.8 (L) 01/24/2021 0205   HCT 38.4 (L) 01/24/2021 0205   PLT 276 01/24/2021 0205   MCV 79.0 (L) 01/24/2021 0205   MCH 26.3 01/24/2021 0205   MCHC 33.3 01/24/2021 0205   RDW 14.4 01/24/2021 0205   LYMPHSABS 3.5 01/24/2020 0029   MONOABS 0.7 01/24/2020 0029   EOSABS 0.2 01/24/2020 0029   BASOSABS 0.0 01/24/2020 0029    BMET    Component Value Date/Time   NA 139 01/24/2021 0205   K 3.7 01/24/2021 0205   CL 105 01/24/2021 0205   CO2 24 01/24/2021 0205   GLUCOSE 178 (H) 01/24/2021 0205   BUN 9 01/24/2021 0205   CREATININE 0.98 01/24/2021 0205   CALCIUM 8.9 01/24/2021 0205   GFRNONAA >60 01/24/2021 0205   GFRAA >60 02/13/2020 0058    INR    Component Value Date/Time   INR 1.0 01/16/2021 1119     Intake/Output Summary (Last 24 hours) at 01/24/2021 0728 Last data filed at 01/24/2021 0500 Gross per 24 hour  Intake 2599.83 ml  Output 1650 ml  Net 949.83 ml     Assessment/Plan:  50 y.o. male is s/p:   Right SFA to tibioperoneal trunk bypass with reversed, ipsilateral, translocated greater saphenous vein  1 Day Post-Op   -brisk doppler signals right PT/peroneal>DP -right calf mildly tender but soft.  Anterior compartment non-tender and soft.  -needs to mobilize today -DVT prophylaxis:  Sq heparin   Doreatha Massed, PA-C [facility] [phone] 01/24/2021 7:28 AM  I have independently interviewed and examined patient and agree with PA assessment and plan above. Palpable right PT pulse.   Reigna Ruperto C. Randie Heinz, MD [facility] [place] Office: [phone] Pager: [phone]

## 2021-01-24 NOTE — Telephone Encounter (Signed)
Spoke with the patient advised of the following and he verbalized understanding.

## 2021-01-24 NOTE — TOC Progression Note (Addendum)
Transition of Care [facility]) - Progression Note    Patient Details  Name: Sean [name] MRN: [id number] Date of Birth: 1971/04/11  [facility]) CM/SW Contact  Lockie Pares, RN Phone Number: 01/24/2021, 11:55 AM  Clinical Narrative:    CIR does not take patients insurance .  Called [facility]  and left confidential message to call this CM to discuss.  [phone]  Spoke to patient about CIR, patient is amiable to look at [facility] IP rehab and gives permission to send information.  Called [facility] to see if they took insurance . They are awaiting patients information, faxing to [phone], H&P, Progress note, and PT recommendation/note   1640 Refaxed OT notes.  They are reviewing all notes now for aceptance  Expected Discharge Plan: Home/Self Care Barriers to Discharge: Continued Medical Work up  Expected Discharge Plan and Services Expected Discharge Plan: Home/Self Care   Discharge Planning Services: CM Consult   Living arrangements for the past 2 months: Single Family Home                                       Social Determinants of Health (SDOH) Interventions    Readmission Risk Interventions No flowsheet data found.

## 2021-01-24 NOTE — Progress Notes (Signed)
Inpatient Rehab Admissions Coordinator Note:   Per PT recommendations, pt was screened for CIR candidacy by Estill Dooms, PT, DPT.  Pt's insurance is out of network.  TOC will need to refer out for acute inpatient rehabilitation at an in network facility.  Please contact me with questions.   Estill Dooms, PT, DPT [phone] 01/24/21 11:15 AM

## 2021-01-24 NOTE — Progress Notes (Signed)
Mobility Specialist - Progress Note   01/24/21 1125  Mobility  Activity Transferred:  Bed to chair  Level of Assistance Minimal assist, patient does 75% or more  Assistive Device None  Mobility Out of bed to chair with meals (Simultaneous filing. User may not have seen previous data.)  Mobility Response Tolerated fair  Mobility performed by Mobility specialist  $Mobility charge 1 Mobility   Pt min assist to move RLE while sitting up on edge of bed. He was unable to stand due to RLE pain, but was able to scoot transfer to the drop arm recliner. Pt left in chair w/ RLE elevated and call bell at side. VSS throughout.   Mamie Levers Mobility Specialist Mobility Specialist Phone: [phone]

## 2021-01-24 NOTE — Evaluation (Signed)
Physical Therapy Evaluation Patient Details Name: Sean [name] MRN: [id number] DOB: [birth year] Today's Date: 01/24/2021   History of Present Illness  50 yo admitted 6/2 for Rt SFA to tibioperoneal BPG. PMHx: Left AKA 01/30/20, HTN, PVD  Clinical Impression  Pt extremely pleasant and willing to mobilize stating no pain at rest in bed and numbness of RLE. However, with pt transitioned to sitting and dependent position pain up to 8/10 with medication given. Pt educated for importance of premedication for movement and need for continued transfers and mobility. Pt with decreased strength, ROM, transfers and function due to pain and will benefit from acute therapy to maximize mobility, safety and function to decrease burden of care. Pt unfortunately has disposed of his prior WC and RW and only has cane at home which further limits mobility options.     Follow Up Recommendations CIR    Equipment Recommendations  Wheelchair (measurements PT);Rolling walker with 5" wheels    Recommendations for Other Services       Precautions / Restrictions Precautions Precautions: Fall Precaution Comments: left AKA with prosthesis Restrictions Weight Bearing Restrictions: No      Mobility  Bed Mobility Overal bed mobility: Needs Assistance Bed Mobility: Supine to Sit;Sit to Supine;Rolling Rolling: Min assist   Supine to sit: HOB elevated;Min assist Sit to supine: Min assist   General bed mobility comments: cues for sequence with use of rail and assist to move RLE off of and onto bed with use of pad to scoot hips. Min assist to lift trunk.    Transfers Overall transfer level: Needs assistance   Transfers: Lateral/Scoot Transfers          Lateral/Scoot Transfers: Min assist General transfer comment: min assist with pad to scoot along EOB toward HOB. Unable to attempt standing due to pain at this time  Ambulation/Gait                Stairs            Wheelchair Mobility     Modified Rankin (Stroke Patients Only)       Balance Overall balance assessment: No apparent balance deficits (not formally assessed)                                           Pertinent Vitals/Pain Pain Assessment: 0-10 Pain Score: 8  Pain Location: RLE dependent Pain Descriptors / Indicators: Aching;Guarding Pain Intervention(s): Limited activity within patient's tolerance;Monitored during session;RN gave pain meds during session;Repositioned    Home Living Family/patient expects to be discharged to:: Private residence Living Arrangements: Spouse/significant other;Children Available Help at Discharge: Available 24 hours/day Type of Home: House Home Access: Stairs to enter   [employer] of Steps: 4 Home Layout: Two level;1/2 bath on main level Home Equipment: Cane - single point;Tub bench Additional Comments: can sleep on first floor in recliner    Prior Function Level of Independence: Independent with assistive device(s)         Comments: threw away RW and WC when it messed up     Hand Dominance        Extremity/Trunk Assessment   Upper Extremity Assessment Upper Extremity Assessment: Overall WFL for tasks assessed    Lower Extremity Assessment Lower Extremity Assessment: RLE deficits/detail RLE: Unable to fully assess due to pain    Cervical / Trunk Assessment Cervical / Trunk Assessment: Normal  Communication  Communication: No difficulties  Cognition Arousal/Alertness: Awake/alert Behavior During Therapy: WFL for tasks assessed/performed Overall Cognitive Status: Within Functional Limits for tasks assessed                                        General Comments      Exercises General Exercises - Lower Extremity Long Arc Quad: AAROM;Right;Seated;5 reps   Assessment/Plan    PT Assessment Patient needs continued PT services  PT Problem List Decreased strength;Decreased mobility;Decreased range  of motion;Decreased activity tolerance;Decreased balance;Decreased knowledge of use of DME;Pain;Decreased skin integrity       PT Treatment Interventions DME instruction;Therapeutic exercise;Gait training;Balance training;Functional mobility training;Therapeutic activities;Stair training;Patient/family education    PT Goals (Current goals can be found in the Care Plan section)  Acute Rehab PT Goals Patient Stated Goal: return to cooking and fishing PT Goal Formulation: With patient Time For Goal Achievement: 02/07/21 Potential to Achieve Goals: Good    Frequency Min 4X/week   Barriers to discharge        Co-evaluation               AM-PAC PT "6 Clicks" Mobility  Outcome Measure Help needed turning from your back to your side while in a flat bed without using bedrails?: A Little Help needed moving from lying on your back to sitting on the side of a flat bed without using bedrails?: A Little Help needed moving to and from a bed to a chair (including a wheelchair)?: Total Help needed standing up from a chair using your arms (e.g., wheelchair or bedside chair)?: Total Help needed to walk in hospital room?: Total Help needed climbing 3-5 steps with a railing? : Total 6 Click Score: 10    End of Session   Activity Tolerance: Patient limited by pain Patient left: in bed;with call bell/phone within reach Nurse Communication: Mobility status PT Visit Diagnosis: Other abnormalities of gait and mobility (R26.89);Difficulty in walking, not elsewhere classified (R26.2);Pain Pain - Right/Left: Right Pain - part of body: Leg    Time: 4695-0722 PT Time Calculation (min) (ACUTE ONLY): 32 min   Charges:   PT Evaluation $PT Eval Moderate Complexity: 1 Mod PT Treatments $Therapeutic Activity: 8-22 mins        Esmeralda Blanford P, PT Acute Rehabilitation Services Pager: [phone] Office: [phone]   Coltan Spinello B Makyah Lavigne 01/24/2021, 10:46 AM

## 2021-01-24 NOTE — Discharge Instructions (Signed)
 [facility] [place]  Discharge instructions  Lower Extremity Bypass Surgery  Please refer to the following instruction for your post-procedure care. Your surgeon or physician assistant will discuss any changes with you.  Activity  You are encouraged to walk as much as you can. You can slowly return to normal activities during the month after your surgery. Avoid strenuous activity and heavy lifting until your doctor tells you it's OK. Avoid activities such as vacuuming or swinging a golf club. Do not drive until your doctor give the OK and you are no longer taking prescription pain medications. It is also normal to have difficulty with sleep habits, eating and bowel movement after surgery. These will go away with time.  Bathing/Showering  Shower daily after you go home. Do not soak in a bathtub, hot tub, or swim until the incision heals completely.  Incision Care  Clean your incision with mild soap and water. Shower every day. Pat the area dry with a clean towel. You do not need a bandage unless otherwise instructed. Do not apply any ointments or creams to your incision. If you have open wounds you will be instructed how to care for them or a visiting nurse may be arranged for you. If you have staples or sutures along your incision they will be removed at your post-op appointment. You may have skin glue on your incision. Do not peel it off. It will come off on its own in about one week.  Wash the groin wound with soap and water daily and pat dry. (No tub bath-only shower)  Then put a dry gauze or washcloth in the groin to keep this area dry to help prevent wound infection.  Do this daily and as needed.  Do not use Vaseline or neosporin on your incisions.  Only use soap and water on your incisions and then protect and keep dry.  Diet  Resume your normal diet. There are no special food restrictions following this procedure. A low fat/ low cholesterol diet is  recommended for all patients with vascular disease. In order to heal from your surgery, it is CRITICAL to get adequate nutrition. Your body requires vitamins, minerals, and protein. Vegetables are the best source of vitamins and minerals. Vegetables also provide the perfect balance of protein. Processed food has little nutritional value, so try to avoid this.  Medications  Resume taking all your medications unless your doctor or physician assistant tells you not to. If your incision is causing pain, you may take over-the-counter pain relievers such as acetaminophen (Tylenol). If you were prescribed a stronger pain medication, please aware these medication can cause nausea and constipation. Prevent nausea by taking the medication with a snack or meal. Avoid constipation by drinking plenty of fluids and eating foods with high amount of fiber, such as fruits, vegetables, and grains. Take Colace 100 mg (an over-the-counter stool softener) twice a day as needed for constipation.  Do not take Tylenol if you are taking prescription pain medications.  Follow Up  Our office will schedule a follow up appointment 2-3 weeks following discharge.  Please call us immediately for any of the following conditions  Severe or worsening pain in your legs or feet while at rest or while walking Increase pain, redness, warmth, or drainage (pus) from your incision site(s) Fever of 101 degree or higher The swelling in your leg with the bypass suddenly worsens and becomes more painful than when you were in the hospital If you have   been instructed to feel your graft pulse then you should do so every day. If you can no longer feel this pulse, call the office immediately. Not all patients are given this instruction.  Leg swelling is common after leg bypass surgery.  The swelling should improve over a few months following surgery. To improve the swelling, you may elevate your legs above the level of your heart while you are  sitting or resting. Your surgeon or physician assistant may ask you to apply an ACE wrap or wear compression (TED) stockings to help to reduce swelling.  Reduce your risk of vascular disease  Stop smoking. If you would like help call [facility] at 1-800-QUIT-NOW (1-800-784-8669) or [place] at 336-586-4000.  Manage your cholesterol Maintain a desired weight Control your diabetes weight Control your diabetes Keep your blood pressure down  If you have any questions, please call the office at [phone]  

## 2021-01-24 NOTE — Evaluation (Signed)
Occupational Therapy Evaluation Patient Details Name: Sean [name] MRN: [id number] DOB: [birth year] Today's Date: 01/24/2021    History of Present Illness 50 yo admitted 6/2 for Rt SFA to tibioperoneal BPG. PMHx: Left AKA 01/30/20, HTN, PVD   Clinical Impression   Patient is s/p R SFA to tibioperoneal BPG surgery resulting in functional limitations due to the deficits listed below (see OT problem list). Pt ambulating with prosthetic for indep adls prior to admission. Pt currently requires max (A) for basic transfer from chair to bed with RW. Pt requires rocking momentum to achieve standing. Pt reports prosthetic needs repairs due to turning to the R. Pt was able to don sock / sleeve this session supervision with increased time. Pt needs (A) to sustain balance to lock prosthetic. Recommend patient show staff how to lock prosthetic to do it for patient for initial transfers to help with progression.  Patient will benefit from skilled OT acutely to increase independence and safety with ADLS to allow discharge CIR.     Follow Up Recommendations  CIR (will be referred out of [facility] due to insurance provider)    Equipment Recommendations  Other (comment);3 in 1 bedside commode (bariatric DME required)    Recommendations for Other Services Rehab consult     Precautions / Restrictions Precautions Precautions: Fall Precaution Comments: left AKA with prosthesis      Mobility Bed Mobility Overal bed mobility: Needs Assistance Bed Mobility: Sit to Supine Rolling: Min guard     Sit to supine: Mod assist   General bed mobility comments: pt require (A) of the therapist to hold R LE    Transfers                      Balance                                           ADL either performed or assessed with clinical judgement   ADL Overall ADL's : Needs assistance/impaired Eating/Feeding: Modified independent   Grooming: Modified independent;Sitting                    Toilet Transfer: Maximal assistance;Stand-pivot (elevated surface will need bariatric toilet) Toilet Transfer Details (indicate cue type and reason): elevated surface required to safety complete and hand holds simulated chair transfer         Functional mobility during ADLs: Maximal assistance;Rolling walker General ADL Comments: pt able to don sleeve and sock for prosthetic pt able to click into leg with min (A) sitting. pt benefits from R LE elevated on leg rest for the initiation of task. pt using rocking momentum with x2 attempts but achieving static standing in RW. pt able to reach with R UE to lock L E prosthetic quickly with Max (A) from therapist. Pt static standing and becoming dizzy "going to pass out" so turn pivot to bed with stepping in RW to bed. pt assisted to supine with bed tilted and BP obtained SBP 140s. pt reports L flank pain. pt after ~10 minutes reports pain has resolved reports tender to touch     Vision Baseline Vision/History: No visual deficits       Perception     Praxis      Pertinent Vitals/Pain Pain Assessment: Faces Faces Pain Scale: Hurts whole lot Pain Location: L flank after movement Pain Descriptors / Indicators: Aching;Guarding Pain Intervention(s):  Monitored during session;Repositioned     Hand Dominance Right   Extremity/Trunk Assessment Upper Extremity Assessment Upper Extremity Assessment: Overall WFL for tasks assessed   Lower Extremity Assessment Lower Extremity Assessment: RLE deficits/detail RLE Deficits / Details: reports feeling stiff using hands to help position leg   Cervical / Trunk Assessment Cervical / Trunk Assessment: Normal   Communication Communication Communication: No difficulties   Cognition Arousal/Alertness: Awake/alert Behavior During Therapy: WFL for tasks assessed/performed Overall Cognitive Status: Within Functional Limits for tasks assessed                                      General Comments  VSS SBP 140 after transfer and then SBP 117 after allowed time to remain supine in the bed with pain resolving    Exercises General Exercises - Lower Extremity Long Arc QuadBarbaraann Boys;Right;15 reps;Supine   Shoulder Instructions      Home Living Family/patient expects to be discharged to:: Private residence Living Arrangements: Spouse/significant other;Children Available Help at Discharge: Available 24 hours/day Type of Home: House Home Access: Stairs to enter [employer] of Steps: 4   Home Layout: Two level;1/2 bath on main level Alternate Level Stairs-Number of Steps: 14   Bathroom Shower/Tub: Producer, television/film/video: Standard     Home Equipment: Cane - single point;Tub bench   Additional Comments: can sleep on first floor in recliner      Prior Functioning/Environment Level of Independence: Independent with assistive device(s)        Comments: threw away RW and WC when it messed up        OT Problem List: Decreased strength;Decreased activity tolerance;Impaired balance (sitting and/or standing);Decreased knowledge of use of DME or AE;Obesity;Pain      OT Treatment/Interventions: Self-care/ADL training;Therapeutic exercise;Energy conservation;DME and/or AE instruction;Manual therapy;Therapeutic activities;Patient/family education;Balance training    OT Goals(Current goals can be found in the care plan section) Acute Rehab OT Goals Patient Stated Goal: to get to walking again OT Goal Formulation: With patient Time For Goal Achievement: 02/07/21 Potential to Achieve Goals: Good  OT Frequency: Min 3X/week   Barriers to D/C:            Co-evaluation              AM-PAC OT "6 Clicks" Daily Activity     Outcome Measure Help from another person eating meals?: None Help from another person taking care of personal grooming?: None Help from another person toileting, which includes using toliet, bedpan, or urinal?: A  Lot Help from another person bathing (including washing, rinsing, drying)?: A Lot Help from another person to put on and taking off regular upper body clothing?: None Help from another person to put on and taking off regular lower body clothing?: A Little 6 Click Score: 19   End of Session Equipment Utilized During Treatment: Gait belt;Rolling walker Nurse Communication: Mobility status;Precautions  Activity Tolerance: Patient tolerated treatment well Patient left: in bed;with call bell/phone within reach;with bed alarm set  OT Visit Diagnosis: Unsteadiness on feet (R26.81);Muscle weakness (generalized) (M62.81);Pain Pain - Right/Left: Left Pain - part of body:  (side belly reports since tuesday bowel movement)                Time: 4825-0037 OT Time Calculation (min): 32 min Charges:  OT General Charges $OT Visit: 1 Visit OT Evaluation $OT Eval Moderate Complexity: 1 Mod OT Treatments $Self  Care/Home Management : 8-22 mins   Brynn, OTR/L  Acute Rehabilitation Services Pager: [phone] Office: [phone] .   Mateo Flow 01/24/2021, 3:04 PM

## 2021-01-25 NOTE — Progress Notes (Signed)
Mobility Specialist - Progress Note   01/25/21 1733  Mobility  Activity Transferred:  Chair to bed  Level of Assistance Minimal assist, patient does 75% or more  Assistive Device None  Mobility Sit up in bed/chair position for meals  Mobility Response Tolerated fair  Mobility performed by Mobility specialist  $Mobility charge 1 Mobility   Pt min assist to lift RLE while scooting back in to bed. He continued to have RLE pain and LLQ cramping w/ mobility, RN aware. Pt left in bed w/ call bell at side. VSS.   Mamie Levers Mobility Specialist Mobility Specialist Phone: [phone]

## 2021-01-25 NOTE — Progress Notes (Signed)
Mobility Specialist - Progress Note   01/25/21 1541  Mobility  Activity Transferred:  Bed to chair  Level of Assistance Minimal assist, patient does 75% or more  Assistive Device None  Mobility Out of bed to chair with meals  Mobility Response Tolerated fair  Mobility performed by Mobility specialist  $Mobility charge 1 Mobility   Pt reporting his prosthetic keeps turning to the R and did not want to attempt to stand and pivot w/ or w/o the prosthetic, but was agreeable to scoot transfer. Pt c/o R groin pain and LLQ cramping with mobility. Pt left in chair w/ leg elevated and family in room. RN aware of cramping/pain. VSS.   Mamie Levers Mobility Specialist Mobility Specialist Phone: [phone]

## 2021-01-25 NOTE — Progress Notes (Addendum)
  Progress Note    01/25/2021 7:16 AM 2 Days Post-Op  Subjective:  Says he feels good this morning  Tm 99.1 now afebrile HR 70's-80's NSR 130's-150's systolic 100% RA  Vitals:   01/24/21 2318 01/25/21 0437  BP: 134/80 (!) 153/84  Pulse: 75 67  Resp: 17 19  Temp: 98.7 F (37.1 C) 98.1 F (36.7 C)  SpO2: 94% 99%    Physical Exam: Cardiac:  regular Lungs:  Non labored Incisions:  All incisions look good Extremities:  Palpable right PT pulse as well as brisk biphasic doppler signal   CBC    Component Value Date/Time   WBC 12.5 (H) 01/24/2021 0205   RBC 4.86 01/24/2021 0205   HGB 12.8 (L) 01/24/2021 0205   HCT 38.4 (L) 01/24/2021 0205   PLT 276 01/24/2021 0205   MCV 79.0 (L) 01/24/2021 0205   MCH 26.3 01/24/2021 0205   MCHC 33.3 01/24/2021 0205   RDW 14.4 01/24/2021 0205   LYMPHSABS 3.5 01/24/2020 0029   MONOABS 0.7 01/24/2020 0029   EOSABS 0.2 01/24/2020 0029   BASOSABS 0.0 01/24/2020 0029    BMET    Component Value Date/Time   NA 139 01/24/2021 0205   K 3.7 01/24/2021 0205   CL 105 01/24/2021 0205   CO2 24 01/24/2021 0205   GLUCOSE 178 (H) 01/24/2021 0205   BUN 9 01/24/2021 0205   CREATININE 0.98 01/24/2021 0205   CALCIUM 8.9 01/24/2021 0205   GFRNONAA >60 01/24/2021 0205   GFRAA >60 02/13/2020 0058    INR    Component Value Date/Time   INR 1.0 01/16/2021 1119     Intake/Output Summary (Last 24 hours) at 01/25/2021 0716 Last data filed at 01/24/2021 2159 Gross per 24 hour  Intake 387.32 ml  Output 1850 ml  Net -1462.68 ml     Assessment/Plan:  50 y.o. male is s/p:  Right SFA to tibioperoneal trunk bypass with reversed, ipsilateral, translocated greater saphenous vein   2 Days Post-Op   -brisk doppler signal right PT (also palpable).   -expected leg swelling-discussed with pt and RN to elevate his leg.  -right calf soft and non tender today -mobilize more today.  OOB with meals. -PT recommending CIR, however, our CIR is out of network.   Pt's information being faxed to [facility] IP rehab.   -DVT prophylaxis:  Sq heparin   Doreatha Massed, PA-C [facility] [phone] 01/25/2021 7:16 AM  Agree with above.  Pt asked about his ascending aneurysm.  This was evaluated by his cardiologist preop with chest CT.  Aneurysm is 5.5 cm this can be followed by his cardiologist or referred to cardiac surgery as outpt.  [name], MD [facility] [place] Office: [phone]

## 2021-01-26 NOTE — Progress Notes (Addendum)
  Progress Note    01/26/2021 7:26 AM 3 Days Post-Op  Subjective:  Says he had a better day yesterday.  Still having a little bit of a hard time with prosthesis and mobilizing.  He got to chair and back to bed yesterday.  Says the incision at the top of his thigh is sore.   Afebrile HR 60's-90's NSR 110's-120's systolic 95% RA  Vitals:   01/25/21 2300 01/26/21 0340  BP: 119/75 122/82  Pulse: 77 79  Resp: 15 17  Temp: 98.3 F (36.8 C) 97.8 F (36.6 C)  SpO2: 95% 95%    Physical Exam: Cardiac:  regular Lungs:  Non labored Incisions:  All incisions look good.  Extremities:  Brisk biphasic right PT doppler signal   CBC    Component Value Date/Time   WBC 12.5 (H) 01/24/2021 0205   RBC 4.86 01/24/2021 0205   HGB 12.8 (L) 01/24/2021 0205   HCT 38.4 (L) 01/24/2021 0205   PLT 276 01/24/2021 0205   MCV 79.0 (L) 01/24/2021 0205   MCH 26.3 01/24/2021 0205   MCHC 33.3 01/24/2021 0205   RDW 14.4 01/24/2021 0205   LYMPHSABS 3.5 01/24/2020 0029   MONOABS 0.7 01/24/2020 0029   EOSABS 0.2 01/24/2020 0029   BASOSABS 0.0 01/24/2020 0029    BMET    Component Value Date/Time   NA 139 01/24/2021 0205   K 3.7 01/24/2021 0205   CL 105 01/24/2021 0205   CO2 24 01/24/2021 0205   GLUCOSE 178 (H) 01/24/2021 0205   BUN 9 01/24/2021 0205   CREATININE 0.98 01/24/2021 0205   CALCIUM 8.9 01/24/2021 0205   GFRNONAA >60 01/24/2021 0205   GFRAA >60 02/13/2020 0058    INR    Component Value Date/Time   INR 1.0 01/16/2021 1119     Intake/Output Summary (Last 24 hours) at 01/26/2021 0726 Last data filed at 01/26/2021 0555 Gross per 24 hour  Intake 480 ml  Output 1810 ml  Net -1330 ml     Assessment/Plan:  50 y.o. male is s/p:  Right SFA to tibioperoneal trunk bypass with reversed, ipsilateral, translocated greater saphenous vein   3 Days Post-Op   -pt doing well with brisk right PT doppler signal -swelling improved today in the right leg.  Continue to elevate when not  mobilizing. -increase mobilization today.  Awaiting insurance approval from rehab facilities at [facility] and [place].   -DVT prophylaxis:  Sq heparin  -discussed with Cardiac Surgery PA and she has sent a message to their office for outpatient follow up as his ascending aortic aneurysm measured 5.2cm on CT without contrast.   Good BP control the past 24 hrs in the 120's systolic.    Doreatha Massed, PA-C [facility] [phone] 01/26/2021 7:26 AM  Agree with above.  Patent bypass.  Awaiting disposition Rehab Continue pain control Needs to be more mobile  [name], MD [facility] [place] Office: [phone]

## 2021-01-26 NOTE — Progress Notes (Signed)
Mobility Specialist - Progress Note   01/26/21 1410  Mobility  Activity Ambulated to bathroom  Level of Assistance Contact guard assist, steadying assist  Assistive Device Front wheel walker;BSC  Distance Ambulated (ft) 20 ft  Mobility Out of bed for toileting  Mobility Response Tolerated well  Mobility performed by Mobility specialist;Nurse  $Mobility charge 1 Mobility   Pt's pain improved today, and was able to ambulate. He rated his pain 4/10 in his RLE during mobility, compared to 8/10 yesterday. Pt to bathroom, unsuccessful in having a BM, then ambulated to the recliner. He did not require assist for bed mobility or to stand from the bedside or the toilet w/ BSC over it. Contact guard for safety. Pt left in recliner, RLE elevated, and RN in room.   Mamie Levers Mobility Specialist Mobility Specialist Phone: [phone]

## 2021-01-27 NOTE — Progress Notes (Signed)
Physical Therapy Treatment Patient Details Name: Sean [name] MRN: [id number] DOB: Jul 04, 1971 Today's Date: 01/27/2021    History of Present Illness 50 yo admitted 6/2 for Rt SFA to tibioperoneal BPG. PMHx: Left AKA 01/30/20, HTN, PVD    PT Comments    Pt moving well and without pain this session after premedication. Pt able to hop without prosthesis as well as walk and perform stairs with prosthesis. Pt with significantly improved functional mobility and D/C plan changed to reflect appropriateness of HHPT at this time. Will continue to follow to increase endurance and stability with continued mobility with staff encouraged.   HR 108-124   Follow Up Recommendations  Home health PT;Supervision for mobility/OOB     Equipment Recommendations  Rolling walker with 5" wheels    Recommendations for Other Services       Precautions / Restrictions Precautions Precautions: Fall Precaution Comments: left AKA with prosthesis    Mobility  Bed Mobility               General bed mobility comments: pt sitting EOB on arrival    Transfers Overall transfer level: Modified independent               General transfer comment: from elevated bed and recliner with 4 blankets pt able to sit and stand x 3 without physical assist  Ambulation/Gait Ambulation/Gait assistance: Min guard Gait Distance (Feet): 100 Feet Assistive device: Rolling walker (2 wheeled) Gait Pattern/deviations: Step-through pattern;Decreased stride length   Gait velocity interpretation: 1.31 - 2.62 ft/sec, indicative of limited community ambulator General Gait Details: Pt with initial hopping without prosthesis grossly 40' with RW and performed single step. Pt then donned prosthesis for 3 steps and completed 100' of gait with RW. pt with slight internal rotation of LLE with gait with knee in extension.   Stairs Stairs: Yes Stairs assistance: Min guard Stair Management: Step to pattern;Forwards Number of  Stairs: 3 General stair comments: pt hopped up one step without prosthesis with left rail and RW on right stabilized. Pt then donned prosthesis and walked up 3 steps with left rail and RW on right. descent with bil rails   Wheelchair Mobility    Modified Rankin (Stroke Patients Only)       Balance Overall balance assessment: No apparent balance deficits (not formally assessed)                                          Cognition Arousal/Alertness: Awake/alert Behavior During Therapy: WFL for tasks assessed/performed Overall Cognitive Status: Within Functional Limits for tasks assessed                                        Exercises      General Comments        Pertinent Vitals/Pain Pain Assessment: No/denies pain    Home Living                      Prior Function            PT Goals (current goals can now be found in the care plan section) Progress towards PT goals: Progressing toward goals    Frequency    Min 3X/week      PT Plan Current plan remains appropriate;Discharge plan needs to  be updated;Frequency needs to be updated    Co-evaluation              AM-PAC PT "6 Clicks" Mobility   Outcome Measure  Help needed turning from your back to your side while in a flat bed without using bedrails?: None Help needed moving from lying on your back to sitting on the side of a flat bed without using bedrails?: None Help needed moving to and from a bed to a chair (including a wheelchair)?: A Little Help needed standing up from a chair using your arms (e.g., wheelchair or bedside chair)?: A Little Help needed to walk in hospital room?: A Little Help needed climbing 3-5 steps with a railing? : A Little 6 Click Score: 20    End of Session Equipment Utilized During Treatment: Gait belt Activity Tolerance: Patient tolerated treatment well Patient left: in chair;with call bell/phone within reach Nurse Communication:  Mobility status PT Visit Diagnosis: Other abnormalities of gait and mobility (R26.89);Difficulty in walking, not elsewhere classified (R26.2)     Time: 7867-6720 PT Time Calculation (min) (ACUTE ONLY): 25 min  Charges:  $Gait Training: 23-37 mins                     Merryl Hacker, PT Acute Rehabilitation Services Pager: [phone] Office: [phone]    Enedina Finner Faigy Stretch 01/27/2021, 9:48 AM

## 2021-01-27 NOTE — Progress Notes (Signed)
[facility] [place] PROGRESS NOTE  ASSESSMENT / PLAN: Sean [name] is a 50 y.o. male status post right superficial femoral artery to tibioperoneal trunk bypass with reversed, ipsilateral, translocated greater saphenous vein 01/23/21. Doing well. Ready for discharge. Home health PT/OT. Continue best medical therapy for PAD. Follow up with Dr. Randie Heinz or PA in 4 weeks with ABI and duplex.   SUBJECTIVE: Feels great. Mobilizing. Pain well controlled. Feels ready for home.  OBJECTIVE: BP (!) 139/94 (BP Location: Right Arm)   Pulse 79   Temp 97.9 F (36.6 C) (Oral)   Resp 20   SpO2 97%   Intake/Output Summary (Last 24 hours) at 01/27/2021 1049 Last data filed at 01/26/2021 1724 Gross per 24 hour  Intake 358 ml  Output 1100 ml  Net -742 ml    Right leg incisions clean and dry Good doppler flow in the foot Mild edema RLE L AKA  CBC Latest Ref Rng & Units 01/24/2021 01/23/2021 01/16/2021  WBC 4.0 - 10.5 K/uL 12.5(H) 14.5(H) 8.2  Hemoglobin 13.0 - 17.0 g/dL 12.8(L) 13.5 15.8  Hematocrit 39.0 - 52.0 % 38.4(L) 41.5 48.5  Platelets 150 - 400 K/uL 276 276 291     CMP Latest Ref Rng & Units 01/24/2021 01/23/2021 01/16/2021  Glucose 70 - 99 mg/dL 622(Q) - 333(L)  BUN 6 - 20 mg/dL 9 - 7  Creatinine 4.56 - 1.24 mg/dL 2.56 3.89 3.73  Sodium 135 - 145 mmol/L 139 - 140  Potassium 3.5 - 5.1 mmol/L 3.7 - 3.7  Chloride 98 - 111 mmol/L 105 - 106  CO2 22 - 32 mmol/L 24 - 27  Calcium 8.9 - 10.3 mg/dL 8.9 - 9.3  Total Protein 6.5 - 8.1 g/dL - - 6.6  Total Bilirubin 0.3 - 1.2 mg/dL - - 0.9  Alkaline Phos 38 - 126 U/L - - 76  AST 15 - 41 U/L - - 27  ALT 0 - 44 U/L - - 37    Estimated Creatinine Clearance: 139.4 mL/min (by C-G formula based on SCr of 0.98 mg/dL).  Rande Brunt. Lenell Antu, MD [facility] Phone Number: [phone] 01/27/2021 10:49 AM

## 2021-01-27 NOTE — Plan of Care (Signed)
  Problem: Education: Goal: Knowledge of General Education information will improve Description: Including pain rating scale, medication(s)/side effects and non-pharmacologic comfort measures Outcome: Adequate for Discharge   

## 2021-01-27 NOTE — TOC Transition Note (Signed)
Transition of Care (TOC) - CM/SW Discharge Note Donn Pierini RN, BSN Transitions of Care Unit 4E- RN Case Manager See Treatment Team for direct phone #    Patient Details  Name: Sean [name] MRN: [id number] Date of Birth: 25-Jun-1971  Transition of [facility]) CM/SW Contact:  Darrold Span, RN Phone Number: 01/27/2021, 2:12 PM   Clinical Narrative:    Pt stable for transition home, updated recs for home with [facility]. Orders have been placed for [facility] and DME needs. CM spoke with pt at bedside and offered choice Per CMS guidelines from medicare.gov website with star ratings (copy placed in shadow chart), per pt he is familiar with [facility] and [facility] however does not really have a preference. Pt is agreeable to use in house provider for DME needs.  Address, phone # and PCP all confirmed with pt in epic.   Call made to [facility] with [employer] for DME need- RW to be delivered to room prior to discharge.   Call made to [facility] with [facility] for HHPT referral- referral has been accepted. Cm let pt know that Frances Furbish has accepted referral for [facility] needs with phone call to the room.    Final next level of care: Home w Home Health Services Barriers to Discharge: Barriers Resolved   Patient Goals and CMS Choice Patient states their goals for this hospitalization and ongoing recovery are:: return home CMS Medicare.gov Compare Post Acute Care list provided to:: Patient Choice offered to / list presented to : Patient  Discharge Placement               Home with [facility]        Discharge Plan and Services   Discharge Planning Services: CM Consult Post Acute Care Choice: Home Health,Durable Medical Equipment          DME Arranged: Walker rolling DME Agency: [employer] Date DME Agency Contacted: 01/27/21 Time DME Agency Contacted: 1330 Representative spoke with at DME Agency: Velna Hatchet HH Arranged: PT HH Agency: [facility] Date [facility] Agency Contacted: 01/27/21 Time HH Agency Contacted:  1330 Representative spoke with at [facility] Agency: Kandee Keen  Social Determinants of Health (SDOH) Interventions     Readmission Risk Interventions Readmission Risk Prevention Plan 01/27/2021  Post Dischage Appt Complete  Medication Screening Complete  Transportation Screening Complete  Some recent data might be hidden

## 2021-01-27 NOTE — Progress Notes (Signed)
Discharge instructions (including medications) discussed with and copy provided to patient/caregiver 

## 2021-01-27 NOTE — Discharge Summary (Signed)
Discharge Summary     Sean [name] 07-09-1971 50 y.o. male  [id number]  Admission Date: 01/23/2021  Discharge Date: 01/27/2021  Physician: Juventino Slovak*  Admission Diagnosis: PAD (peripheral artery disease) (HCC) [I73.9]  HPI:   This is a 50 y.o. male who presents for evaluation of PAD. Surgical history significant for left above-the-knee popliteal to posterior tibial artery bypass with vein and 4 compartment fasciotomies due to acute limb ischemia by Dr. Randie Heinz on 09/21/2018. He underwent thrombolysis of left lower extremity bypass by Dr. Chestine Spore on 04/28/2019. 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 Randie Heinz on 05/09/2019. He was converted to a left above-the-knee amputation by Dr. Edilia Bo on 01/30/2020. He is now ambulatory with a prosthetic and continues to have physical therapy twice a week. Since becoming ambulatory he is noticing short distance claudication with right calf pain. He states that this is lifestyle limiting and would like to pursue any procedure that would improve his symptoms. He denies any rest pain or nonhealing wounds of right lower extremity. He is on aspirin, Plavix, statin daily. He is a tobacco smoker.  Hospital Course:  The patient was admitted to the hospital and taken to the operating room on 01/23/2021 and underwent: 1.  Harvest right greater saphenous vein 2.  Right SFA to tibioperoneal trunk bypass with reversed, ipsilateral, translocated greater saphenous vein    Findings: The distal SFA was patent and very large.  The tibioperoneal trunk was heavily calcified we performed endarterectomy of the tibioperoneal trunk into the peroneal artery and posterior tibial artery.  The vein itself measured approximately 4 mm in diameter throughout and it was reversed.  At completion there was a strong posterior tibial signal at the ankle that was graft dependent.  The pt tolerated the procedure well and was transported to the PACU in  good condition.   By POD 1, pt with brisk doppler signals right PT/peroneal>DP.  PT was also palpable.   POD 2, pt doing well.  Discussed leg elevation due to expected swelling.  He was inquiring about his aneurysm follow up and appt has been made with Dr. Dorris Fetch on 6/28.  He was being considered for IP rehab at other facilities due to insurance.  POD 3, leg swelling improved.  He was mobilizing more.   Continues to have brisk doppler signal .  POD 4, pt doing well and PT recommending HHPT and RW.  He is discharged home once this is arranged.    CBC    Component Value Date/Time   WBC 12.5 (H) 01/24/2021 0205   RBC 4.86 01/24/2021 0205   HGB 12.8 (L) 01/24/2021 0205   HCT 38.4 (L) 01/24/2021 0205   PLT 276 01/24/2021 0205   MCV 79.0 (L) 01/24/2021 0205   MCH 26.3 01/24/2021 0205   MCHC 33.3 01/24/2021 0205   RDW 14.4 01/24/2021 0205   LYMPHSABS 3.5 01/24/2020 0029   MONOABS 0.7 01/24/2020 0029   EOSABS 0.2 01/24/2020 0029   BASOSABS 0.0 01/24/2020 0029    BMET    Component Value Date/Time   NA 139 01/24/2021 0205   K 3.7 01/24/2021 0205   CL 105 01/24/2021 0205   CO2 24 01/24/2021 0205   GLUCOSE 178 (H) 01/24/2021 0205   BUN 9 01/24/2021 0205   CREATININE 0.98 01/24/2021 0205   CALCIUM 8.9 01/24/2021 0205   GFRNONAA >60 01/24/2021 0205   GFRAA >60 02/13/2020 0058     Discharge Instructions    Discharge patient   Complete  by: As directed    Home when [facility] needs arranged.   Discharge disposition: 01-Home or Self Care   Discharge patient date: 01/27/2021      Discharge Diagnosis:  PAD (peripheral artery disease) (HCC) [I73.9]  Secondary Diagnosis: Patient Active Problem List   Diagnosis Date Noted  . Dyslipidemia 01/15/2021  . Hypertension   . Class 2 severe obesity due to excess calories with serious comorbidity and body mass index (BMI) of 35.0 to 35.9 in adult (HCC) 12/10/2020  . Tobacco use disorder 12/10/2020  . Urinary urgency 12/10/2020  . Weak urinary  stream 12/10/2020  . Preop cardiovascular exam 12/05/2020  . Other long term (current) drug therapy 12/05/2020  . Tinea cruris 12/05/2020  . Erectile dysfunction due to diseases classified elsewhere 04/06/2020  . 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 Chronic pain syndrome 02/13/2020  . Neuropathic pain 11/10/2019  . Phantom limb syndrome with pain (HCC) 09/12/2019  . History of left below knee amputation (HCC) 05/16/2019  . Impaired mobility and ADLs 05/16/2019  . Ischemia of left lower extremity 04/28/2019  . PAD (peripheral artery disease) (HCC) 04/28/2019  . Ischemia of extremity 09/21/2018  . PVD (peripheral vascular disease) (HCC) 09/20/2018  . Acquired deformity of right hand 11/27/2015  . Essential hypertension 11/27/2015  . Arthritis 05/06/2014  . Carpal tunnel syndrome 05/06/2014  . Dislocation of distal interphalangeal (DIP) joint of finger 05/06/2014  . 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 Acquired deformity of right hand 11/27/2015   Formatting of this note might be different from the original. Secondary to injury/surgeries  . 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 Arthritis 05/06/2014  . Carpal tunnel syndrome 05/06/2014  . Chronic pain syndrome 02/13/2020  . Class 2 severe obesity due to excess calories with serious comorbidity and body mass index (BMI) of 35.0 to 35.9 in adult [facility]) 12/10/2020  . Depression   . Dislocation of distal interphalangeal (DIP) joint of finger 05/06/2014  . Encounter for immunization 12/05/2020  . Erectile dysfunction due to diseases classified elsewhere 04/06/2020  . Essential hypertension 11/27/2015  . History of left below knee amputation (HCC) 05/16/2019  . Hypertension   . Impaired mobility and ADLs 05/16/2019  . Ischemia of extremity 09/21/2018  . Ischemia of left lower extremity 04/28/2019  . 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  Neuropathic pain 11/10/2019   Formatting of this note might be different from the original. Added automatically from request for surgery [id number]  . Other long term (current) drug therapy 12/05/2020  . PAD (peripheral artery disease) (HCC) 04/28/2019  . Phantom limb syndrome with pain (HCC) 09/12/2019   Formatting of this note might be different from the original. Added automatically from request for surgery [id number]  . PVD (peripheral vascular disease) (HCC)   . Scalp psoriasis 11/27/2015  . Tobacco use disorder 12/10/2020  . Weak urinary stream 12/10/2020     Allergies as of 01/27/2021   No Known Allergies     Medication List    TAKE these medications   amLODipine 10 MG tablet Commonly known as: NORVASC Take 10 mg by mouth in the morning.   aspirin EC 81 MG tablet Take 81 mg by mouth in the morning.   Belbuca 300 MCG Film Generic drug: Buprenorphine HCl Place 300 mcg inside cheek in the morning and at bedtime.   clobetasol ointment 0.05 % Commonly known as: TEMOVATE Apply 1 application topically  2 (two) times daily as needed (skin irritation).   clopidogrel 75 MG tablet Commonly known as: PLAVIX Take 1 tablet (75 mg total) by mouth daily with breakfast.   cyanocobalamin 1000 MCG/ML injection Commonly known as: (VITAMIN B-12) Inject 1,000 mcg into the muscle once a week.   vitamin B-12 250 MCG tablet Commonly known as: CYANOCOBALAMIN Take 250 mcg by mouth daily.   DULoxetine 30 MG capsule Commonly known as: CYMBALTA Take 30 mg by mouth in the morning.   Humira Pen 40 MG/0.4ML Pnkt Generic drug: Adalimumab Inject 40 mg into the skin every 14 (fourteen) days.   hydrALAZINE 10 MG tablet Commonly known as: APRESOLINE Take 10 mg by mouth 2 (two) times daily.   olmesartan-hydrochlorothiazide 40-25 MG tablet Commonly known as: BENICAR HCT Take 1 tablet by mouth daily.   pregabalin 300 MG capsule Commonly known as: LYRICA Take 300 mg by mouth daily.   rosuvastatin 20 MG  tablet Commonly known as: CRESTOR Take 1 tablet (20 mg total) by mouth daily.   Testosterone 20.25 MG/ACT (1.62%) Gel Apply 40.5 mg topically daily. Rub on shoulder   Vitamin D (Ergocalciferol) 1.25 MG (50000 UNIT) Caps capsule Commonly known as: DRISDOL Take 50,000 Units by mouth every Thursday.   zinc gluconate 50 MG tablet Take 50 mg by mouth daily.            Durable Medical Equipment  (From admission, onward)         Start     Ordered   01/27/21 1229  For home use only DME Walker rolling  Once       Question Answer Comment  Walker: With 5 Inch Wheels   Patient needs a walker to treat with the following condition S/P femoral-popliteal bypass surgery      01/27/21 1229          Discharge Instructions: [facility] Discharge instructions Lower Extremity Bypass Surgery  Please refer to the following instruction for your post-procedure care. Your surgeon or physician assistant will discuss any changes with you.  Activity  You are encouraged to walk as much as you can. You can slowly return to normal activities during the month after your surgery. Avoid strenuous activity and heavy lifting until your doctor tells you it's OK. Avoid activities such as vacuuming or swinging a golf club. Do not drive until your doctor give the OK and you are no longer taking prescription pain medications. It is also normal to have difficulty with sleep habits, eating and bowel movement after surgery. These will go away with time.  Bathing/Showering  You may shower after you go home. Do not soak in a bathtub, hot tub, or swim until the incision heals completely.  Incision Care  Clean your incision with mild soap and water. Shower every day. Pat the area dry with a clean towel. You do not need a bandage unless otherwise instructed. Do not apply any ointments or creams to your incision. If you have open wounds you will be instructed how to care for them or a  visiting nurse may be arranged for you. If you have staples or sutures along your incision they will be removed at your post-op appointment. You may have skin glue on your incision. Do not peel it off. It will come off on its own in about one week.  Wash the groin wound with soap and water daily and pat dry. (No tub bath-only shower)  Then put a dry gauze or  washcloth in the groin to keep this area dry to help prevent wound infection.  Do this daily and as needed.  Do not use Vaseline or neosporin on your incisions.  Only use soap and water on your incisions and then protect and keep dry.  Diet  Resume your normal diet. There are no special food restrictions following this procedure. A low fat/ low cholesterol diet is recommended for all patients with vascular disease. In order to heal from your surgery, it is CRITICAL to get adequate nutrition. Your body requires vitamins, minerals, and protein. Vegetables are the best source of vitamins and minerals. Vegetables also provide the perfect balance of protein. Processed food has little nutritional value, so try to avoid this.  Medications  Resume taking all your medications unless your doctor or Physician Assistant tells you not to. If your incision is causing pain, you may take over-the-counter pain relievers such as acetaminophen (Tylenol). If you were prescribed a stronger pain medication, please aware these medication can cause nausea and constipation. Prevent nausea by taking the medication with a snack or meal. Avoid constipation by drinking plenty of fluids and eating foods with high amount of fiber, such as fruits, vegetables, and grains. Take Colace 100 mg (an over-the-counter stool softener) twice a day as needed for constipation.  Do not take Tylenol if you are taking prescription pain medications.  Follow Up  Our office will schedule a follow up appointment 2-3 weeks following discharge.  Please call us immediately for any of the following  conditions  .Severe or worsening pain in your legs or feet while at rest or while walking .Increase pain, redness, warmth, or drainage (pus) from your incision site(s) . Fever of 101 degree or higher . The swelling in your leg with the bypass suddenly worsens and becomes more painful than when you were in the hospital . If you have been instructed to feel your graft pulse then you should do so every day. If you can no longer feel this pulse, call the office immediately. Not all patients are given this instruction. .  Leg swelling is common after leg bypass surgery.  The swelling should improve over a few months following surgery. To improve the swelling, you may elevate your legs above the level of your heart while you are sitting or resting. Your surgeon or physician assistant may ask you to apply an ACE wrap or wear compression (TED) stockings to help to reduce swelling.  Reduce your risk of vascular disease  Stop smoking. If you would like help call [facility] at 1-800-QUIT-NOW (305-795-88931-458-728-9402) or [place] at 772 600 [phone].  . Manage your cholesterol . Maintain a desired weight . Control your diabetes weight . Control your diabetes . Keep your blood pressure down .  If you have any questions, please call the office at [phone]   Prescriptions given: None given due to current rx in PDMP  Disposition: home with HHPT  Patient's condition: is Good  Follow up: 1. [facility] in 3 weeks 2. [facility] 02/18/2021 for evaluation of ascending aortic aneurysm   Doreatha MassedSamantha Kinsley Nicklaus, PA-C [facility] [phone] 01/27/2021  12:29 PM  - For VQI Registry use ---   Post-op:  Wound infection: No  Graft infection: No  Transfusion: No    If yes, n/a units given New Arrhythmia: No Ipsilateral amputation: No, [ ]  Minor, [ ]  BKA, [ ]  AKA Discharge patency: [x ] Primary, [ ]  Primary assisted, [ ]  Secondary, [ ]  Occluded Patency judged by: [ ]   Dopper only,  Palpable graft pulse,   Palpable distal pulse,  ABI inc. > 0.15,  Duplex Discharge ABI: R not done, L  D/C Ambulatory Status: Ambulatory with Assistance  Complications: MI: No,  Troponin only,  EKG or Clinical CHF: No Resp failure:No,  Pneumonia,  Ventilator Chg in renal function: No,  Inc. Cr > 0.5,  Temp. Dialysis,   Permanent dialysis Stroke: No,  Minor,  Major Return to OR: No  Reason for return to OR:  Bleeding,  Infection,  Thrombosis,  Revision  Discharge medications: Statin use:  yes ASA use:  yes Plavix use:  yes Beta blocker use: no CCB use:  Yes ACEI use:   no ARB use:  yes Coumadin use: no

## 2021-01-27 NOTE — Progress Notes (Signed)
Mobility Specialist: Progress Note   01/27/21 1103  Mobility  Activity Ambulated in hall  Level of Assistance Contact guard assist, steadying assist  Assistive Device Front wheel walker  Distance Ambulated (ft) 180 ft  Mobility Ambulated with assistance in hallway  Mobility Response Tolerated well  Mobility performed by Mobility specialist  Bed Position Chair  $Mobility charge 1 Mobility   Pre-Mobility: 101 HR, 96% SpO2 During Mobility: 130 HR Post-Mobility: 121 HR, 139/97 BP, 99% SpO2  Pt was contact guard to stand from recliner as well as during ambulation. Pt asx during ambulation. Pt back to recliner after walk per request with call bell in reach.   [facility] Sean [name] Mobility Specialist Mobility Specialist Phone: [phone]

## 2021-02-03 ENCOUNTER — Other Ambulatory Visit (HOSPITAL_COMMUNITY)
Admission: RE | Admit: 2021-02-03 | Discharge: 2021-02-03 | Disposition: A | Discharge status: Home / Self Care | Payer: BLUE CROSS/BLUE SHIELD | Source: Ambulatory Visit | Attending: Cardiology | Admitting: Cardiology

## 2021-02-03 ENCOUNTER — Telehealth: Payer: Self-pay

## 2021-02-03 DIAGNOSIS — Z20822 Contact with and (suspected) exposure to covid-19: Secondary | ICD-10-CM | POA: Diagnosis not present

## 2021-02-03 DIAGNOSIS — Z01812 Encounter for preprocedural laboratory examination: Secondary | ICD-10-CM | POA: Insufficient documentation

## 2021-02-03 LAB — SARS CORONAVIRUS 2 (TAT 6-24 HRS): SARS Coronavirus 2: NEGATIVE

## 2021-02-03 NOTE — Telephone Encounter (Signed)
HH called to confirm patient could use ice s/p fem pop bypass. Advised this was okay.

## 2021-02-18 ENCOUNTER — Other Ambulatory Visit: Payer: Self-pay

## 2021-02-18 ENCOUNTER — Other Ambulatory Visit: Payer: Self-pay | Admitting: Thoracic Surgery (Cardiothoracic Vascular Surgery)

## 2021-02-18 ENCOUNTER — Encounter: Payer: Self-pay | Admitting: Thoracic Surgery (Cardiothoracic Vascular Surgery)

## 2021-02-18 ENCOUNTER — Institutional Professional Consult (permissible substitution): Payer: BLUE CROSS/BLUE SHIELD | Admitting: Thoracic Surgery (Cardiothoracic Vascular Surgery)

## 2021-02-18 VITALS — BP 125/73 | HR 104 | Resp 20 | Ht 77.0 in | Wt 310.0 lb

## 2021-02-18 DIAGNOSIS — I7121 Aneurysm of the ascending aorta, without rupture: Secondary | ICD-10-CM

## 2021-02-18 DIAGNOSIS — I712 Thoracic aortic aneurysm, without rupture: Secondary | ICD-10-CM | POA: Diagnosis not present

## 2021-02-18 DIAGNOSIS — J181 Lobar pneumonia, unspecified organism: Secondary | ICD-10-CM

## 2021-02-18 NOTE — Progress Notes (Signed)
PCP is Chyrel Masson Referring Provider is Sherren Kerns, MD  Chief Complaint  Patient presents with   Thoracic Aortic Aneurysm    Surgical consult, CT Chest 01/22/21, ECHO 01/16/21    HPI: Mr. Lichty is sent for consultation regarding a thoracic aortic aneurysm.  Rachid Parham is a 50 year old man with a history of tobacco abuse, PAD, left AKA, phantom pain, recent right femoral-popliteal bypass, obesity, hypertension, and depression.  He has a 40-pack-year history of smoking and continues to smoke a pack of cigarettes daily.  He recently had a CT of the chest, which showed a 5.2 cm ascending aneurysm.  There also was consolidation in the superior segment the right lower lobe.  He is not having any chest pain, pressure, or tightness.  He denies fever, chills, or night sweats.  Occasional cough.  Past Medical History:  Diagnosis Date   Acquired deformity of right hand 11/27/2015   Formatting of this note might be different from the original. Secondary to injury/surgeries   AKA stump complication (HCC) 02/13/2020   Arthritis 05/06/2014   Carpal tunnel syndrome 05/06/2014   Chronic pain syndrome 02/13/2020   Class 2 severe obesity due to excess calories with serious comorbidity and body mass index (BMI) of 35.0 to 35.9 in adult 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 Added automatically from request for surgery [phone]   Other long  term (current) drug therapy 12/05/2020   PAD (peripheral artery disease) (HCC) 04/28/2019   Phantom limb syndrome with pain (HCC) 09/12/2019   Formatting of this note might be different from the original. 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 INVASIVE CV LAB;  Service: Cardiovascular;  Laterality: N/A;   AMPUTATION Left 01/30/2020   Procedure: LEFT ABOVE KNEE AMPUTATION;  Surgeon: Chuck Hint, MD;  Location: [facility] OR;  Service: Vascular;  Laterality: Left;   ANGIOPLASTY Left 04/27/2019   Procedure: Balloon Angioplasty of left posterior tibial artery;  Surgeon: Maeola Harman, MD;  Location: [facility] OR;  Service: Vascular;  Laterality: Left;   EMBOLECTOMY Left 09/21/2018   Procedure: THROMBECTOMY LEFT POPLITEAL ARTERY BYPASS LEFT POPLITEAL TO TIBIAL ARTERY WITH SAPHENOUS VEIN HARVEST - FASCIOTOMY;  Surgeon: Chuck Hint, MD;  Location: [facility] OR;  Service: Vascular;  Laterality: Left;   FEMORAL-POPLITEAL BYPASS GRAFT Right 01/23/2021   Procedure: RIGHT LOWER EXTREMITY FEMORAL-POPLITEAL ARTERY BYPASS;  Surgeon: Maeola Harman, MD;  Location: [facility] OR;  Service: Vascular;  Laterality: Right;   finger surgery Right    small finger  (2 different surgeries)   LOWER EXTREMITY ANGIOGRAM Left 04/27/2019   Procedure: LEFT LOWER EXTREMITY ANGIOGRAM;  Surgeon: Maeola Harman, MD;  Location: [facility] OR;   Service: Vascular;  Laterality: Left;   PERIPHERAL VASCULAR BALLOON ANGIOPLASTY Left 05/08/2019   Procedure: PERIPHERAL VASCULAR BALLOON ANGIOPLASTY;  Surgeon: Maeola Harman, MD;  Location: [facility] INVASIVE CV LAB;  Service: Cardiovascular;  Laterality: Left;  Posterior tibial   PERIPHERAL VASCULAR INTERVENTION Left 04/28/2019   Procedure: PERIPHERAL VASCULAR INTERVENTION;  Surgeon: Cephus Shelling, MD;  Location: [facility] INVASIVE CV LAB;  Service: Cardiovascular;  Laterality: Left;   PERIPHERAL VASCULAR THROMBECTOMY Bilateral 04/28/2019   Procedure: PERIPHERAL VASCULAR THROMBECTOMY;  Surgeon: Cephus Shelling, MD;  Location: [facility] INVASIVE CV LAB;  Service: Cardiovascular;  Laterality: Bilateral;   PERIPHERAL VASCULAR THROMBECTOMY Left 05/08/2019   Procedure: PERIPHERAL VASCULAR THROMBECTOMY;  Surgeon: Maeola Harman, MD;  Location: [facility] INVASIVE CV LAB;  Service: Cardiovascular;  Laterality: Left;  posterior tibial   TRANSMETATARSAL AMPUTATION Left 05/09/2019   Procedure: Left Below Knee Amputation;  Surgeon: Maeola Harman, MD;  Location: [facility] OR;  Service: Vascular;  Laterality: Left;    Family History  Problem Relation Age of Onset   Diabetes Father     Social History Social History   Tobacco Use   Smoking status: Every Day    Packs/day: 1.00    Pack years: 0.00    Types: Cigarettes   Smokeless tobacco: Never  Vaping Use   Vaping Use: Never used  Substance Use Topics   Alcohol use: Never   Drug use: Never    Current Outpatient Medications  Medication Sig Dispense Refill   Adalimumab (HUMIRA PEN) 40 MG/0.4ML PNKT Inject 40 mg into the skin every 14 (fourteen) days.     amLODipine (NORVASC) 10 MG tablet Take 10 mg by mouth in the morning.     aspirin EC 81 MG tablet Take 81 mg by mouth in the morning.     Buprenorphine HCl (BELBUCA) 300 MCG FILM Place 300 mcg inside cheek in the morning and at bedtime.     clobetasol ointment (TEMOVATE) 0.05 % Apply 1  application topically 2 (two) times daily as needed (skin irritation).     clopidogrel (PLAVIX) 75 MG tablet Take 1 tablet (75 mg total) by mouth daily with breakfast. 30 tablet 2   cyanocobalamin (,VITAMIN B-12,) 1000 MCG/ML injection Inject 1,000 mcg into the muscle once a week.     DULoxetine (CYMBALTA) 30 MG capsule Take 30 mg by mouth in the morning.     hydrALAZINE (APRESOLINE) 10 MG tablet Take 10 mg by mouth 2 (two) times daily.     olmesartan-hydrochlorothiazide (BENICAR HCT) 40-25 MG tablet Take 1 tablet by mouth daily.     pregabalin (LYRICA) 300 MG capsule Take 300 mg by mouth daily.     rosuvastatin (CRESTOR) 20 MG tablet Take 1 tablet (20 mg total) by mouth daily. 90 tablet 1   Testosterone 20.25 MG/ACT (1.62%) GEL Apply 40.5 mg topically daily. Rub on shoulder     vitamin B-12 (CYANOCOBALAMIN) 250 MCG tablet Take 250 mcg by mouth daily.     Vitamin D, Ergocalciferol, (DRISDOL) 1.25 MG (50000 UNIT) CAPS capsule Take 50,000 Units by mouth every Thursday.     zinc gluconate 50 MG tablet Take 50 mg by mouth daily.     No current facility-administered medications for this visit.  No Known Allergies  Review of Systems  Constitutional:  Positive for activity change. Negative for appetite change, chills, fever and unexpected weight change.  Respiratory:  Negative for chest tightness and shortness of breath.   Cardiovascular:  Positive for leg swelling. Negative for chest pain.  Genitourinary:  Positive for difficulty urinating.  Musculoskeletal:  Positive for arthralgias.       Left AKA, phantom pain  Neurological:  Negative for syncope.       Pain  All other systems reviewed and are negative.  BP 125/73   Pulse (!) 104   Resp 20   Ht 6\' 5"  (1.956 m)   Wt (!) 310 lb (140.6 kg)   SpO2 96% Comment: RA  BMI 36.76 kg/m  Physical Exam Vitals reviewed.  Constitutional:      General: He is not in acute distress.    Appearance: He is obese.  HENT:     Head: Normocephalic  and atraumatic.  Eyes:     General: No scleral icterus.    Extraocular Movements: Extraocular movements intact.  Cardiovascular:     Rate and Rhythm: Normal rate and regular rhythm.     Heart sounds: Normal heart sounds. No murmur heard. Pulmonary:     Effort: Pulmonary effort is normal. No respiratory distress.     Breath sounds: No wheezing.     Comments: Diminished breath sounds bilaterally Abdominal:     General: There is no distension.     Palpations: Abdomen is soft.  Musculoskeletal:        General: Deformity (Status post left AKA, prosthesis in place) present.     Cervical back: Neck supple.  Lymphadenopathy:     Cervical: No cervical adenopathy.  Skin:    General: Skin is warm and dry.  Neurological:     General: No focal deficit present.     Mental Status: He is alert and oriented to person, place, and time.     Cranial Nerves: No cranial nerve deficit.     Motor: No weakness.   Diagnostic Tests: CT CHEST WITHOUT CONTRAST   TECHNIQUE: Multidetector CT imaging of the chest was performed following the standard protocol without IV contrast.   COMPARISON:  No prior chest CT. Chest radiographs 05/17/2019, 04/27/2019 and 09/25/2018. Abdominal CTA runoff 09/20/2018.   FINDINGS: Cardiovascular: The ascending aorta is dilated to approximately 5.2 cm. The aortic arch and descending aorta are normal in caliber. There is no significant calcification within the wall of the aorta or surrounding soft tissue abnormality. No acute vascular findings are seen on noncontrast imaging. The heart size is normal. There is no pericardial effusion.   Mediastinum/Nodes: There are mildly enlarged mediastinal lymph nodes, including a 12 mm right paratracheal node on image 47/2 and a 12 mm subcarinal node on image 74/2. No enlarged axillary or hilar lymph nodes are identified. No evidence of mediastinal hematoma. The thyroid gland, trachea and esophagus demonstrate no  significant findings.   Lungs/Pleura: There is no pleural effusion or pneumothorax. There is dependent consolidation within the superior segment of the right lower lobe, suspicious for pneumonia. The lungs are otherwise clear, without suspicious pulmonary nodularity.   Upper abdomen: The liver demonstrates diffusely decreased density consistent with steatosis. The visualized upper abdomen otherwise appears unremarkable.   Musculoskeletal/Chest wall: There is no chest wall mass or suspicious osseous finding. Mild thoracic spondylosis.   IMPRESSION: 1. Ascending aortic aneurysm measuring up to 5.2 cm in diameter. No acute vascular findings on noncontrast imaging. No previous  relevant imaging of the thoracic aorta. Recommend semi-annual imaging followup by CTA or MRA and referral to cardiothoracic surgery if not already obtained. This recommendation follows 2010 ACCF/AHA/AATS/ACR/ASA/SCA/SCAI/SIR/STS/SVM Guidelines for the Diagnosis and Management of Patients With Thoracic Aortic Disease. Circulation. 2010; 121: T557-D220. Aortic aneurysm NOS (ICD10-I71.9) 2. Superior segment right lower lobe consolidation suspicious for pneumonia. Correlate clinically and recommend radiographic follow-up in 3-4 weeks to ensure resolution and exclude underlying malignancy. 3. These results will be called to the ordering clinician or representative by the Radiologist Assistant, and communication documented in the PACS or [employer].     Electronically Signed   By: Carey Bullocks M.D.   On: 01/22/2021 14:46 I personally reviewed the CT images and concur with the findings noted above.  There is a 5.2 cm ascending aneurysm.  There also is a diffuse groundglass opacity/consolidation in the superior segment of the right lower lobe.  Impression: Aleem Elza is a 50 year old man with a history of tobacco abuse, PAD, left AKA, phantom pain, recent right femoral-popliteal bypass, obesity,  hypertension, and depression.  Ascending aneurysm-5.2 cm aneurysm noted on CT chest.  No indication for surgery at this time.  Rather large gentleman so 5.5 to 6 cm would be an area where we would consider surgery.  Needs semiannual follow-up.  Importance of smoking cessation and blood pressure control were emphasized.  Tobacco abuse-still smoking a pack a day despite losing his left leg and having surgery on the right.  Importance of smoking cessation for pulmonary and cardiovascular reasons was emphasized.  Right lower lobe consolidation-possibly infectious inflammatory but cannot rule out adenocarcinoma with lipidic spread.  Needs a follow-up CT at 8 weeks from his previous scan which was done in early June.  I will plan to see him back in about 5 weeks with a follow-up CT.  If still present will likely need bronchoscopy for diagnostic purposes.  Plan: STOP smoking Return in 5 weeks with CT chest to follow-up consolidation of superior segment of right lower lobe (8 weeks from previous scan) Will need semiannual follow-up for 5.2 cm ascending aneurysm.  I spent 40 minutes in review of records, images, and in consultation with Mr. Lauderback today. Loreli Slot, MD [facility] [phone]

## 2021-02-19 ENCOUNTER — Telehealth: Payer: Self-pay

## 2021-02-19 NOTE — Telephone Encounter (Signed)
Verdell Face with [facility] called after seeing pt today. A part of the incision on his RLE opened up and has some clear drainage. She states there is no odor, no redness and no fever. Pt has appt this week and has been advised to keep the incision clean and dry.

## 2021-02-21 ENCOUNTER — Ambulatory Visit (INDEPENDENT_AMBULATORY_CARE_PROVIDER_SITE_OTHER): Payer: BLUE CROSS/BLUE SHIELD | Admitting: Physician Assistant

## 2021-02-21 ENCOUNTER — Other Ambulatory Visit: Payer: Self-pay

## 2021-02-21 ENCOUNTER — Encounter: Payer: Self-pay | Admitting: Physician Assistant

## 2021-02-21 VITALS — BP 135/91 | HR 57 | Temp 97.7°F | Resp 18 | Ht 77.5 in | Wt 305.0 lb

## 2021-02-21 DIAGNOSIS — I779 Disorder of arteries and arterioles, unspecified: Secondary | ICD-10-CM

## 2021-02-21 DIAGNOSIS — Z89612 Acquired absence of left leg above knee: Secondary | ICD-10-CM

## 2021-02-21 NOTE — Progress Notes (Signed)
POST OPERATIVE OFFICE NOTE    CC:  F/u for surgery  HPI:  HPI: Sean [name] is a 50 y.o. (1971-07-08) male who presents for evaluation of PAD.  Surgical history significant for left above-the-knee popliteal to posterior tibial artery bypass with vein and 4 compartment fasciotomies due to acute limb ischemia by Dr. Randie Heinz on 09/21/2018.  He underwent thrombolysis of left lower extremity bypass by Dr. Chestine Spore on 04/28/2019.  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 Randie Heinz on 05/09/2019.  He was converted to a left above-the-knee amputation by Dr. Edilia Bo on 01/30/2020.  Since his last visit he had developed short distance claudication symptoms on the right LE.  He underwent angiogram which showed occluded popliteal artery with reconstitution of his distal tibioperoneal trunk.  He was taken to surgery and is now s/p Right SFA to tibioperoneal trunk bypass with reversed, ipsilateral, translocated greater saphenous vein.  He is here today for f/u visit and incision check.    Pt returns today for follow up.  Pt states his leg feels much better and he no longer has calf pain with ambulation.  He has a little separation of the distal leg incision with minimal venous bleeding.  He has been keeping it covered with a dry dressing.  He demised fever and chills.    No Known Allergies  Current Outpatient Medications  Medication Sig Dispense Refill   Adalimumab (HUMIRA PEN) 40 MG/0.4ML PNKT Inject 40 mg into the skin every 14 (fourteen) days.     amLODipine (NORVASC) 10 MG tablet Take 10 mg by mouth in the morning.     aspirin EC 81 MG tablet Take 81 mg by mouth in the morning.     Buprenorphine HCl (BELBUCA) 300 MCG FILM Place 300 mcg inside cheek in the morning and at bedtime.     clobetasol ointment (TEMOVATE) 0.05 % Apply 1 application topically 2 (two) times daily as needed (skin irritation).     clopidogrel (PLAVIX) 75 MG tablet Take 1 tablet (75 mg total) by mouth daily with breakfast. 30  tablet 2   cyanocobalamin (,VITAMIN B-12,) 1000 MCG/ML injection Inject 1,000 mcg into the muscle once a week.     DULoxetine (CYMBALTA) 30 MG capsule Take 30 mg by mouth in the morning.     hydrALAZINE (APRESOLINE) 10 MG tablet Take 10 mg by mouth 2 (two) times daily.     olmesartan-hydrochlorothiazide (BENICAR HCT) 40-25 MG tablet Take 1 tablet by mouth daily.     pregabalin (LYRICA) 300 MG capsule Take 300 mg by mouth daily.     rosuvastatin (CRESTOR) 20 MG tablet Take 1 tablet (20 mg total) by mouth daily. 90 tablet 1   Testosterone 20.25 MG/ACT (1.62%) GEL Apply 40.5 mg topically daily. Rub on shoulder     vitamin B-12 (CYANOCOBALAMIN) 250 MCG tablet Take 250 mcg by mouth daily.     Vitamin D, Ergocalciferol, (DRISDOL) 1.25 MG (50000 UNIT) CAPS capsule Take 50,000 Units by mouth every Thursday.     zinc gluconate 50 MG tablet Take 50 mg by mouth daily.     No current facility-administered medications for this visit.     ROS:  See HPI  Physical Exam:      Extremities:  Doppler signals DP/PT signals intact but not brisk Sensation intact and motor on the right LE.   Lungs non labored breathing Left AKA stump has lost muscle mass and the prothesis is no longer fitting well.  Skin is soft and  incision has healed well.  Assessment/Plan:  This is a 50 y.o. male who is s/p:Right fem-pop bypass with vein harvest.  Left AKA well healed with muscle loss and ill fitting prothesis.     I will have him follow up with [employer] for prothesis fitting.  He will start wet to dry dressing changes for the distal incision.  He will return in 2 weeks for ABI's and wound check.  Prior to leaving the hospital his doppler signals were brisk.  We discussed that if he develops rest pain or return of his previous claudication symptoms he will call sooner.   Mosetta Pigeon PA-C [facility] [phone]   Clinic MD:  Randie Heinz

## 2021-02-25 ENCOUNTER — Other Ambulatory Visit: Payer: Self-pay

## 2021-02-25 ENCOUNTER — Telehealth: Payer: Self-pay

## 2021-02-25 ENCOUNTER — Telehealth: Payer: Self-pay | Admitting: Vascular Surgery

## 2021-02-25 DIAGNOSIS — I779 Disorder of arteries and arterioles, unspecified: Secondary | ICD-10-CM

## 2021-02-25 NOTE — Telephone Encounter (Signed)
Received VM from [facility] about needing an urgent/emergent appt because patient's RLE wound is open. Called back and left VM for return call - patient was seen in office on 02/21/2021 and wound was open then - pt to do wet to dry dressing changes. Unclear if PT is calling about this or the wound has worsened.

## 2021-02-25 NOTE — Telephone Encounter (Signed)
Patient is a 50 year old male who recently had a lower extremity bypass performed by Dr. Randie Heinz.  His physical therapist apparently talked to [name] earlier in the day today worried about a wound on his right leg.  It was seen by [name] last week.  Patient thinks that the wound is worse and is draining more.  We will try to arrange for him to be seen in APP clinic tomorrow to further evaluate the wound in his right leg.  [name], MD [facility] Office: [phone]

## 2021-02-26 ENCOUNTER — Emergency Department (HOSPITAL_COMMUNITY): Payer: BLUE CROSS/BLUE SHIELD

## 2021-02-26 ENCOUNTER — Emergency Department (HOSPITAL_COMMUNITY): Payer: BLUE CROSS/BLUE SHIELD | Admitting: Certified Registered"

## 2021-02-26 ENCOUNTER — Other Ambulatory Visit: Payer: Self-pay

## 2021-02-26 ENCOUNTER — Encounter (HOSPITAL_COMMUNITY)
Admission: EM | Disposition: A | Discharge status: Home / Self Care | Payer: Self-pay | Source: Home / Self Care | Attending: Surgery

## 2021-02-26 ENCOUNTER — Inpatient Hospital Stay (HOSPITAL_COMMUNITY)
Admission: EM | Admit: 2021-02-26 | Discharge: 2021-03-02 | DRG: 253 | Disposition: A | Discharge status: Home / Self Care | Payer: BLUE CROSS/BLUE SHIELD | Attending: Surgery | Admitting: Surgery

## 2021-02-26 ENCOUNTER — Encounter (HOSPITAL_COMMUNITY): Payer: Self-pay

## 2021-02-26 DIAGNOSIS — Z6839 Body mass index (BMI) 39.0-39.9, adult: Secondary | ICD-10-CM

## 2021-02-26 DIAGNOSIS — T8131XA Disruption of external operation (surgical) wound, not elsewhere classified, initial encounter: Secondary | ICD-10-CM | POA: Diagnosis present

## 2021-02-26 DIAGNOSIS — D62 Acute posthemorrhagic anemia: Secondary | ICD-10-CM | POA: Diagnosis not present

## 2021-02-26 DIAGNOSIS — G894 Chronic pain syndrome: Secondary | ICD-10-CM | POA: Diagnosis present

## 2021-02-26 DIAGNOSIS — F17210 Nicotine dependence, cigarettes, uncomplicated: Secondary | ICD-10-CM | POA: Diagnosis present

## 2021-02-26 DIAGNOSIS — T82868A Thrombosis of vascular prosthetic devices, implants and grafts, initial encounter: Secondary | ICD-10-CM | POA: Diagnosis present

## 2021-02-26 DIAGNOSIS — M7989 Other specified soft tissue disorders: Secondary | ICD-10-CM | POA: Diagnosis not present

## 2021-02-26 DIAGNOSIS — I739 Peripheral vascular disease, unspecified: Secondary | ICD-10-CM | POA: Diagnosis present

## 2021-02-26 DIAGNOSIS — Z7982 Long term (current) use of aspirin: Secondary | ICD-10-CM

## 2021-02-26 DIAGNOSIS — Z7902 Long term (current) use of antithrombotics/antiplatelets: Secondary | ICD-10-CM | POA: Diagnosis not present

## 2021-02-26 DIAGNOSIS — R2242 Localized swelling, mass and lump, left lower limb: Secondary | ICD-10-CM

## 2021-02-26 DIAGNOSIS — Z79899 Other long term (current) drug therapy: Secondary | ICD-10-CM | POA: Diagnosis not present

## 2021-02-26 DIAGNOSIS — M1990 Unspecified osteoarthritis, unspecified site: Secondary | ICD-10-CM | POA: Diagnosis present

## 2021-02-26 DIAGNOSIS — Z9889 Other specified postprocedural states: Secondary | ICD-10-CM

## 2021-02-26 DIAGNOSIS — I10 Essential (primary) hypertension: Secondary | ICD-10-CM | POA: Diagnosis present

## 2021-02-26 DIAGNOSIS — Y713 Surgical instruments, materials and cardiovascular devices (including sutures) associated with adverse incidents: Secondary | ICD-10-CM | POA: Diagnosis present

## 2021-02-26 DIAGNOSIS — I70291 Other atherosclerosis of native arteries of extremities, right leg: Secondary | ICD-10-CM | POA: Diagnosis not present

## 2021-02-26 DIAGNOSIS — Y838 Other surgical procedures as the cause of abnormal reaction of the patient, or of later complication, without mention of misadventure at the time of the procedure: Secondary | ICD-10-CM | POA: Diagnosis present

## 2021-02-26 DIAGNOSIS — Z20822 Contact with and (suspected) exposure to covid-19: Secondary | ICD-10-CM | POA: Diagnosis present

## 2021-02-26 DIAGNOSIS — M79604 Pain in right leg: Secondary | ICD-10-CM | POA: Diagnosis not present

## 2021-02-26 DIAGNOSIS — Z833 Family history of diabetes mellitus: Secondary | ICD-10-CM

## 2021-02-26 DIAGNOSIS — Z89612 Acquired absence of left leg above knee: Secondary | ICD-10-CM | POA: Diagnosis not present

## 2021-02-26 DIAGNOSIS — I9789 Other postprocedural complications and disorders of the circulatory system, not elsewhere classified: Secondary | ICD-10-CM

## 2021-02-26 DIAGNOSIS — I998 Other disorder of circulatory system: Secondary | ICD-10-CM

## 2021-02-26 HISTORY — PX: THROMBECTOMY OF BYPASS GRAFT FEMORAL- TIBIAL ARTERY: SHX6904

## 2021-02-26 LAB — POCT I-STAT, CHEM 8
BUN: 7 mg/dL (ref 6–20)
Calcium, Ion: 1.25 mmol/L (ref 1.15–1.40)
Chloride: 100 mmol/L (ref 98–111)
Creatinine, Ser: 1.1 mg/dL (ref 0.61–1.24)
Glucose, Bld: 120 mg/dL — ABNORMAL HIGH (ref 70–99)
HCT: 35 % — ABNORMAL LOW (ref 39.0–52.0)
Hemoglobin: 11.9 g/dL — ABNORMAL LOW (ref 13.0–17.0)
Potassium: 3.4 mmol/L — ABNORMAL LOW (ref 3.5–5.1)
Sodium: 143 mmol/L (ref 135–145)
TCO2: 29 mmol/L (ref 22–32)

## 2021-02-26 LAB — TYPE AND SCREEN
ABO/RH(D): O POS
Antibody Screen: NEGATIVE

## 2021-02-26 LAB — CBC
HCT: 32.7 % — ABNORMAL LOW (ref 39.0–52.0)
Hemoglobin: 10.5 g/dL — ABNORMAL LOW (ref 13.0–17.0)
MCH: 26.4 pg (ref 26.0–34.0)
MCHC: 32.1 g/dL (ref 30.0–36.0)
MCV: 82.4 fL (ref 80.0–100.0)
Platelets: 228 10*3/uL (ref 150–400)
RBC: 3.97 MIL/uL — ABNORMAL LOW (ref 4.22–5.81)
RDW: 15 % (ref 11.5–15.5)
WBC: 13 10*3/uL — ABNORMAL HIGH (ref 4.0–10.5)
nRBC: 0 % (ref 0.0–0.2)

## 2021-02-26 LAB — COMPREHENSIVE METABOLIC PANEL
ALT: 26 U/L (ref 0–44)
AST: 24 U/L (ref 15–41)
Albumin: 3.8 g/dL (ref 3.5–5.0)
Alkaline Phosphatase: 68 U/L (ref 38–126)
Anion gap: 7 (ref 5–15)
BUN: 7 mg/dL (ref 6–20)
CO2: 29 mmol/L (ref 22–32)
Calcium: 9.3 mg/dL (ref 8.9–10.3)
Chloride: 105 mmol/L (ref 98–111)
Creatinine, Ser: 0.87 mg/dL (ref 0.61–1.24)
GFR, Estimated: >60 mL/min (ref >60)
Glucose, Bld: 119 mg/dL — ABNORMAL HIGH (ref 70–99)
Potassium: 3.4 mmol/L — ABNORMAL LOW (ref 3.5–5.1)
Sodium: 141 mmol/L (ref 135–145)
Total Bilirubin: 0.9 mg/dL (ref 0.3–1.2)
Total Protein: 6.6 g/dL (ref 6.5–8.1)

## 2021-02-26 LAB — RESP PANEL BY RT-PCR (FLU A&B, COVID) ARPGX2
Influenza A by PCR: NEGATIVE
Influenza B by PCR: NEGATIVE
SARS Coronavirus 2 by RT PCR: NEGATIVE

## 2021-02-26 LAB — CBC WITH DIFFERENTIAL/PLATELET
Abs Immature Granulocytes: 0.03 10*3/uL (ref 0.00–0.07)
Basophils Absolute: 0 10*3/uL (ref 0.0–0.1)
Basophils Relative: 1 %
Eosinophils Absolute: 0.4 10*3/uL (ref 0.0–0.5)
Eosinophils Relative: 5 %
HCT: 43.8 % (ref 39.0–52.0)
Hemoglobin: 14.4 g/dL (ref 13.0–17.0)
Immature Granulocytes: 0 %
Lymphocytes Relative: 48 %
Lymphs Abs: 4.4 10*3/uL — ABNORMAL HIGH (ref 0.7–4.0)
MCH: 26.7 pg (ref 26.0–34.0)
MCHC: 32.9 g/dL (ref 30.0–36.0)
MCV: 81.1 fL (ref 80.0–100.0)
Monocytes Absolute: 0.6 10*3/uL (ref 0.1–1.0)
Monocytes Relative: 7 %
Neutro Abs: 3.5 10*3/uL (ref 1.7–7.7)
Neutrophils Relative %: 39 %
Platelets: 244 10*3/uL (ref 150–400)
RBC: 5.4 MIL/uL (ref 4.22–5.81)
RDW: 15 % (ref 11.5–15.5)
WBC: 8.9 10*3/uL (ref 4.0–10.5)
nRBC: 0 % (ref 0.0–0.2)

## 2021-02-26 LAB — PROTIME-INR
INR: 1 (ref 0.8–1.2)
Prothrombin Time: 13.2 seconds (ref 11.4–15.2)

## 2021-02-26 LAB — CREATININE, SERUM
Creatinine, Ser: 1.11 mg/dL (ref 0.61–1.24)
GFR, Estimated: >60 mL/min (ref >60)

## 2021-02-26 LAB — LACTIC ACID, PLASMA: Lactic Acid, Venous: 1.7 mmol/L (ref 0.5–1.9)

## 2021-02-26 SURGERY — THROMBECTOMY, BYPASS GRAFT, ARTERIAL, FEMORAL TO TIBIAL
Anesthesia: General | Laterality: Right

## 2021-02-26 MED ORDER — PHENYLEPHRINE 40 MCG/ML (10ML) SYRINGE FOR IV PUSH (FOR BLOOD PRESSURE SUPPORT)
PREFILLED_SYRINGE | INTRAVENOUS | Status: DC | PRN
  Administered 2021-02-26: 80 ug via INTRAVENOUS
  Administered 2021-02-26: 160 ug via INTRAVENOUS
  Administered 2021-02-26 (×3): 80 ug via INTRAVENOUS

## 2021-02-26 MED ORDER — ASPIRIN EC 81 MG PO TBEC
81.0000 mg | DELAYED_RELEASE_TABLET | Freq: Every morning | ORAL | Status: DC
  Administered 2021-02-27 – 2021-03-02 (×4): 81 mg via ORAL
  Filled 2021-02-26 (×4): qty 1

## 2021-02-26 MED ORDER — ROCURONIUM BROMIDE 10 MG/ML (PF) SYRINGE
PREFILLED_SYRINGE | INTRAVENOUS | Status: AC
  Filled 2021-02-26: qty 10

## 2021-02-26 MED ORDER — OLMESARTAN MEDOXOMIL-HCTZ 40-25 MG PO TABS: 1.0000 | ORAL_TABLET | Freq: Every day | ORAL | Status: DC

## 2021-02-26 MED ORDER — HYDRALAZINE HCL 20 MG/ML IJ SOLN
5.0000 mg | INTRAMUSCULAR | Status: DC | PRN
Start: 2021-02-26 — End: 2021-03-02

## 2021-02-26 MED ORDER — DOCUSATE SODIUM 100 MG PO CAPS
100.0000 mg | ORAL_CAPSULE | Freq: Every day | ORAL | Status: DC
  Administered 2021-02-27 – 2021-03-02 (×4): 100 mg via ORAL
  Filled 2021-02-26 (×4): qty 1

## 2021-02-26 MED ORDER — DEXAMETHASONE SODIUM PHOSPHATE 10 MG/ML IJ SOLN
INTRAMUSCULAR | Status: AC
  Filled 2021-02-26: qty 1

## 2021-02-26 MED ORDER — ONDANSETRON HCL 4 MG/2ML IJ SOLN
INTRAMUSCULAR | Status: AC
  Filled 2021-02-26: qty 2

## 2021-02-26 MED ORDER — SUCCINYLCHOLINE CHLORIDE 200 MG/10ML IV SOSY
PREFILLED_SYRINGE | INTRAVENOUS | Status: AC
  Filled 2021-02-26: qty 10

## 2021-02-26 MED ORDER — CHLORHEXIDINE GLUCONATE 0.12 % MT SOLN: 15.0000 mL | Freq: Once | OROMUCOSAL | Status: AC

## 2021-02-26 MED ORDER — HEPARIN 6000 UNIT IRRIGATION SOLUTION
Status: DC | PRN
  Administered 2021-02-26: 1

## 2021-02-26 MED ORDER — BISACODYL 5 MG PO TBEC: 5.0000 mg | DELAYED_RELEASE_TABLET | Freq: Every day | ORAL | Status: DC | PRN

## 2021-02-26 MED ORDER — DEXMEDETOMIDINE (PRECEDEX) IN NS 20 MCG/5ML (4 MCG/ML) IV SYRINGE
PREFILLED_SYRINGE | INTRAVENOUS | Status: DC | PRN
  Administered 2021-02-26: 8 ug via INTRAVENOUS

## 2021-02-26 MED ORDER — CEFAZOLIN SODIUM-DEXTROSE 2-4 GM/100ML-% IV SOLN
2.0000 g | Freq: Three times a day (TID) | INTRAVENOUS | Status: AC
  Administered 2021-02-26 – 2021-02-27 (×2): 2 g via INTRAVENOUS
  Filled 2021-02-26 (×2): qty 100

## 2021-02-26 MED ORDER — POTASSIUM CHLORIDE CRYS ER 20 MEQ PO TBCR
20.0000 meq | EXTENDED_RELEASE_TABLET | Freq: Every day | ORAL | Status: AC | PRN
  Administered 2021-02-26: 20 meq via ORAL
  Filled 2021-02-26: qty 2

## 2021-02-26 MED ORDER — PROPOFOL 10 MG/ML IV BOLUS
INTRAVENOUS | Status: DC | PRN
  Administered 2021-02-26: 50 mg via INTRAVENOUS
  Administered 2021-02-26: 150 mg via INTRAVENOUS

## 2021-02-26 MED ORDER — FENTANYL CITRATE (PF) 100 MCG/2ML IJ SOLN
INTRAMUSCULAR | Status: AC
  Filled 2021-02-26: qty 2

## 2021-02-26 MED ORDER — HEPARIN 6000 UNIT IRRIGATION SOLUTION
Status: AC
  Filled 2021-02-26: qty 500

## 2021-02-26 MED ORDER — LIDOCAINE 2% (20 MG/ML) 5 ML SYRINGE
INTRAMUSCULAR | Status: DC | PRN
  Administered 2021-02-26: 80 mg via INTRAVENOUS

## 2021-02-26 MED ORDER — CLOPIDOGREL BISULFATE 75 MG PO TABS
75.0000 mg | ORAL_TABLET | Freq: Every day | ORAL | Status: DC
  Administered 2021-02-27 – 2021-03-02 (×4): 75 mg via ORAL
  Filled 2021-02-26 (×4): qty 1

## 2021-02-26 MED ORDER — MIDAZOLAM HCL 2 MG/2ML IJ SOLN
INTRAMUSCULAR | Status: AC
  Filled 2021-02-26: qty 2

## 2021-02-26 MED ORDER — ONDANSETRON HCL 4 MG/2ML IJ SOLN: 4.0000 mg | Freq: Four times a day (QID) | INTRAMUSCULAR | Status: DC | PRN

## 2021-02-26 MED ORDER — GUAIFENESIN-DM 100-10 MG/5ML PO SYRP: 15.0000 mL | ORAL_SOLUTION | ORAL | Status: DC | PRN

## 2021-02-26 MED ORDER — BUPRENORPHINE HCL 300 MCG BU FILM
300.0000 ug | ORAL_FILM | Freq: Two times a day (BID) | BUCCAL | Status: DC
Start: 2021-02-26 — End: 2021-03-01

## 2021-02-26 MED ORDER — AMLODIPINE BESYLATE 10 MG PO TABS
10.0000 mg | ORAL_TABLET | Freq: Every morning | ORAL | Status: DC
  Administered 2021-02-28 – 2021-03-02 (×3): 10 mg via ORAL
  Filled 2021-02-26 (×3): qty 1

## 2021-02-26 MED ORDER — OXYCODONE-ACETAMINOPHEN 5-325 MG PO TABS
1.0000 | ORAL_TABLET | ORAL | Status: DC | PRN
  Administered 2021-02-26 – 2021-03-02 (×15): 2 via ORAL
  Filled 2021-02-26 (×15): qty 2

## 2021-02-26 MED ORDER — FENTANYL CITRATE (PF) 100 MCG/2ML IJ SOLN
25.0000 ug | INTRAMUSCULAR | Status: DC | PRN
  Administered 2021-02-26: 50 ug via INTRAVENOUS

## 2021-02-26 MED ORDER — DEXAMETHASONE SODIUM PHOSPHATE 10 MG/ML IJ SOLN
INTRAMUSCULAR | Status: DC | PRN
  Administered 2021-02-26: 10 mg via INTRAVENOUS

## 2021-02-26 MED ORDER — HYDROCHLOROTHIAZIDE 25 MG PO TABS
25.0000 mg | ORAL_TABLET | Freq: Every day | ORAL | Status: DC
  Administered 2021-02-28 – 2021-03-02 (×3): 25 mg via ORAL
  Filled 2021-02-26 (×3): qty 1

## 2021-02-26 MED ORDER — LABETALOL HCL 5 MG/ML IV SOLN: 10.0000 mg | INTRAVENOUS | Status: DC | PRN

## 2021-02-26 MED ORDER — CHLORHEXIDINE GLUCONATE CLOTH 2 % EX PADS
6.0000 | MEDICATED_PAD | Freq: Every day | CUTANEOUS | Status: DC
  Administered 2021-02-27 – 2021-03-02 (×4): 6 via TOPICAL

## 2021-02-26 MED ORDER — HEPARIN SODIUM (PORCINE) 1000 UNIT/ML IJ SOLN
INTRAMUSCULAR | Status: DC | PRN
  Administered 2021-02-26: 12000 [IU] via INTRAVENOUS

## 2021-02-26 MED ORDER — IRBESARTAN 300 MG PO TABS
300.0000 mg | ORAL_TABLET | Freq: Every day | ORAL | Status: DC
  Administered 2021-02-28 – 2021-03-02 (×3): 300 mg via ORAL
  Filled 2021-02-26 (×3): qty 1

## 2021-02-26 MED ORDER — SENNOSIDES-DOCUSATE SODIUM 8.6-50 MG PO TABS
1.0000 | ORAL_TABLET | Freq: Every evening | ORAL | Status: DC | PRN
  Administered 2021-02-26 – 2021-03-01 (×2): 1 via ORAL
  Filled 2021-02-26 (×2): qty 1

## 2021-02-26 MED ORDER — SUGAMMADEX SODIUM 200 MG/2ML IV SOLN
INTRAVENOUS | Status: DC | PRN
  Administered 2021-02-26: 100 mg via INTRAVENOUS
  Administered 2021-02-26: 200 mg via INTRAVENOUS

## 2021-02-26 MED ORDER — FENTANYL CITRATE (PF) 250 MCG/5ML IJ SOLN
INTRAMUSCULAR | Status: AC
  Filled 2021-02-26: qty 5

## 2021-02-26 MED ORDER — SODIUM CHLORIDE 0.9 % IV SOLN: INTRAVENOUS | Status: DC

## 2021-02-26 MED ORDER — CEFAZOLIN SODIUM-DEXTROSE 2-3 GM-%(50ML) IV SOLR
INTRAVENOUS | Status: DC | PRN
  Administered 2021-02-26: 2 g via INTRAVENOUS

## 2021-02-26 MED ORDER — SODIUM CHLORIDE 0.9 % IV SOLN: 500.0000 mL | Freq: Once | INTRAVENOUS | Status: DC | PRN

## 2021-02-26 MED ORDER — PROPOFOL 10 MG/ML IV BOLUS
INTRAVENOUS | Status: AC
  Filled 2021-02-26: qty 20

## 2021-02-26 MED ORDER — ACETAMINOPHEN 325 MG PO TABS: 325.0000 mg | ORAL_TABLET | ORAL | Status: DC | PRN

## 2021-02-26 MED ORDER — ROCURONIUM BROMIDE 10 MG/ML (PF) SYRINGE
PREFILLED_SYRINGE | INTRAVENOUS | Status: DC | PRN
  Administered 2021-02-26: 50 mg via INTRAVENOUS
  Administered 2021-02-26: 20 mg via INTRAVENOUS
  Administered 2021-02-26: 40 mg via INTRAVENOUS
  Administered 2021-02-26: 10 mg via INTRAVENOUS

## 2021-02-26 MED ORDER — LIDOCAINE 2% (20 MG/ML) 5 ML SYRINGE
INTRAMUSCULAR | Status: AC
  Filled 2021-02-26: qty 5

## 2021-02-26 MED ORDER — FENTANYL CITRATE (PF) 250 MCG/5ML IJ SOLN
INTRAMUSCULAR | Status: DC | PRN
  Administered 2021-02-26 (×5): 50 ug via INTRAVENOUS

## 2021-02-26 MED ORDER — MIDAZOLAM HCL 2 MG/2ML IJ SOLN
INTRAMUSCULAR | Status: DC | PRN
  Administered 2021-02-26: 2 mg via INTRAVENOUS

## 2021-02-26 MED ORDER — PANTOPRAZOLE SODIUM 40 MG PO TBEC
40.0000 mg | DELAYED_RELEASE_TABLET | Freq: Every day | ORAL | Status: DC
  Administered 2021-02-26 – 2021-03-02 (×5): 40 mg via ORAL
  Filled 2021-02-26 (×5): qty 1

## 2021-02-26 MED ORDER — SUCCINYLCHOLINE CHLORIDE 200 MG/10ML IV SOSY
PREFILLED_SYRINGE | INTRAVENOUS | Status: DC | PRN
  Administered 2021-02-26: 200 mg via INTRAVENOUS

## 2021-02-26 MED ORDER — HEPARIN BOLUS VIA INFUSION
7000.0000 [IU] | Freq: Once | INTRAVENOUS | Status: AC
  Administered 2021-02-26: 7000 [IU] via INTRAVENOUS
  Filled 2021-02-26: qty 7000

## 2021-02-26 MED ORDER — HEMOSTATIC AGENTS (NO CHARGE) OPTIME
TOPICAL | Status: DC | PRN
  Administered 2021-02-26: 2 via TOPICAL

## 2021-02-26 MED ORDER — ORAL CARE MOUTH RINSE: 15.0000 mL | Freq: Once | OROMUCOSAL | Status: AC

## 2021-02-26 MED ORDER — PHENYLEPHRINE HCL-NACL 10-0.9 MG/250ML-% IV SOLN
INTRAVENOUS | Status: DC | PRN
  Administered 2021-02-26: 40 ug/min via INTRAVENOUS

## 2021-02-26 MED ORDER — CHLORHEXIDINE GLUCONATE 0.12 % MT SOLN
OROMUCOSAL | Status: AC
  Administered 2021-02-26: 15 mL via OROMUCOSAL
  Filled 2021-02-26: qty 15

## 2021-02-26 MED ORDER — ALUM & MAG HYDROXIDE-SIMETH 200-200-20 MG/5ML PO SUSP: 15.0000 mL | ORAL | Status: DC | PRN

## 2021-02-26 MED ORDER — ACETAMINOPHEN 650 MG RE SUPP
325.0000 mg | RECTAL | Status: DC | PRN
Start: 2021-02-26 — End: 2021-03-02

## 2021-02-26 MED ORDER — ONDANSETRON HCL 4 MG/2ML IJ SOLN
INTRAMUSCULAR | Status: DC | PRN
  Administered 2021-02-26: 4 mg via INTRAVENOUS

## 2021-02-26 MED ORDER — HEMOSTATIC AGENTS (NO CHARGE) OPTIME
TOPICAL | Status: DC | PRN
  Administered 2021-02-26: 1 via TOPICAL

## 2021-02-26 MED ORDER — MORPHINE SULFATE (PF) 2 MG/ML IV SOLN
2.0000 mg | INTRAVENOUS | Status: DC | PRN
  Administered 2021-02-26: 4 mg via INTRAVENOUS
  Administered 2021-02-26 – 2021-02-27 (×5): 2 mg via INTRAVENOUS
  Administered 2021-02-28: 4 mg via INTRAVENOUS
  Administered 2021-02-28 – 2021-03-01 (×3): 2 mg via INTRAVENOUS
  Filled 2021-02-26: qty 1
  Filled 2021-02-26: qty 2
  Filled 2021-02-26 (×7): qty 1
  Filled 2021-02-26: qty 2

## 2021-02-26 MED ORDER — ALBUMIN HUMAN 5 % IV SOLN: INTRAVENOUS | Status: DC | PRN

## 2021-02-26 MED ORDER — HYDRALAZINE HCL 10 MG PO TABS
10.0000 mg | ORAL_TABLET | Freq: Two times a day (BID) | ORAL | Status: DC
  Administered 2021-02-27 – 2021-03-02 (×6): 10 mg via ORAL
  Filled 2021-02-26 (×6): qty 1

## 2021-02-26 MED ORDER — METOPROLOL TARTRATE 5 MG/5ML IV SOLN: 2.0000 mg | INTRAVENOUS | Status: DC | PRN

## 2021-02-26 MED ORDER — PROTAMINE SULFATE 10 MG/ML IV SOLN
INTRAVENOUS | Status: DC | PRN
  Administered 2021-02-26: 20 mg via INTRAVENOUS
  Administered 2021-02-26: 10 mg via INTRAVENOUS
  Administered 2021-02-26: 20 mg via INTRAVENOUS

## 2021-02-26 MED ORDER — MAGNESIUM SULFATE 2 GM/50ML IV SOLN: 2.0000 g | Freq: Every day | INTRAVENOUS | Status: DC | PRN

## 2021-02-26 MED ORDER — PREGABALIN 100 MG PO CAPS
300.0000 mg | ORAL_CAPSULE | Freq: Every day | ORAL | Status: DC
  Administered 2021-02-26 – 2021-03-02 (×5): 300 mg via ORAL
  Filled 2021-02-26 (×5): qty 3

## 2021-02-26 MED ORDER — PHENOL 1.4 % MT LIQD: 1.0000 | OROMUCOSAL | Status: DC | PRN

## 2021-02-26 MED ORDER — HEPARIN (PORCINE) 25000 UT/250ML-% IV SOLN
2100.0000 [IU]/h | INTRAVENOUS | Status: DC
  Administered 2021-02-26: 2100 [IU]/h via INTRAVENOUS
  Filled 2021-02-26: qty 250

## 2021-02-26 MED ORDER — 0.9 % SODIUM CHLORIDE (POUR BTL) OPTIME
TOPICAL | Status: DC | PRN
  Administered 2021-02-26: 2000 mL

## 2021-02-26 MED ORDER — DULOXETINE HCL 30 MG PO CPEP
30.0000 mg | ORAL_CAPSULE | Freq: Every morning | ORAL | Status: DC
  Administered 2021-02-27 – 2021-03-02 (×4): 30 mg via ORAL
  Filled 2021-02-26 (×4): qty 1

## 2021-02-26 MED ORDER — ROSUVASTATIN CALCIUM 20 MG PO TABS
20.0000 mg | ORAL_TABLET | Freq: Every day | ORAL | Status: DC
  Administered 2021-02-26 – 2021-03-02 (×5): 20 mg via ORAL
  Filled 2021-02-26 (×5): qty 1

## 2021-02-26 MED ORDER — LACTATED RINGERS IV SOLN: INTRAVENOUS | Status: DC

## 2021-02-26 SURGICAL SUPPLY — 94 items
BAG BANDED W/RUBBER/TAPE 36X54 (MISCELLANEOUS) ×2 IMPLANT
BAG COUNTER SPONGE SURGICOUNT (BAG) ×4 IMPLANT
BAG SNAP BAND KOVER 36X36 (MISCELLANEOUS) ×2 IMPLANT
BANDAGE ESMARK 6X9 LF (GAUZE/BANDAGES/DRESSINGS) IMPLANT
BLADE SURG 11 STRL SS (BLADE) ×2 IMPLANT
BNDG ELASTIC 6X15 VLCR STRL LF (GAUZE/BANDAGES/DRESSINGS) ×2 IMPLANT
BNDG ELASTIC 6X5.8 VLCR STR LF (GAUZE/BANDAGES/DRESSINGS) ×4 IMPLANT
BNDG ESMARK 6X9 LF (GAUZE/BANDAGES/DRESSINGS)
BNDG GAUZE ELAST 4 BULKY (GAUZE/BANDAGES/DRESSINGS) ×4 IMPLANT
CANISTER SUCT 3000ML PPV (MISCELLANEOUS) ×2 IMPLANT
CANNULA VESSEL 3MM 2 BLNT TIP (CANNULA) ×2 IMPLANT
CATH ANGIO 5F BER2 65CM (CATHETERS) IMPLANT
CATH EMB 3FR 80CM (CATHETERS) ×2 IMPLANT
CATH EMB 4FR 80CM (CATHETERS) ×2 IMPLANT
CATH EMB 5FR 80CM (CATHETERS) ×2 IMPLANT
CATH OMNI FLUSH .035X70CM (CATHETERS) IMPLANT
CHLORAPREP W/TINT 26 (MISCELLANEOUS) IMPLANT
CLIP VESOCCLUDE MED 24/CT (CLIP) ×2 IMPLANT
CLIP VESOCCLUDE SM WIDE 24/CT (CLIP) ×2 IMPLANT
COVER DOME SNAP 22 D (MISCELLANEOUS) ×2 IMPLANT
COVER PROBE W GEL 5X96 (DRAPES) ×2 IMPLANT
COVER SURGICAL LIGHT HANDLE (MISCELLANEOUS) ×2 IMPLANT
CUFF TOURN SGL QUICK 24 (TOURNIQUET CUFF)
CUFF TOURN SGL QUICK 34 (TOURNIQUET CUFF)
CUFF TOURN SGL QUICK 42 (TOURNIQUET CUFF) IMPLANT
CUFF TRNQT CYL 24X4X16.5-23 (TOURNIQUET CUFF) IMPLANT
CUFF TRNQT CYL 34X4.125X (TOURNIQUET CUFF) IMPLANT
DERMABOND ADVANCED (GAUZE/BANDAGES/DRESSINGS) ×2
DERMABOND ADVANCED .7 DNX12 (GAUZE/BANDAGES/DRESSINGS) ×2 IMPLANT
DEVICE TORQUE H2O (MISCELLANEOUS) IMPLANT
DRAIN CHANNEL 15F RND FF W/TCR (WOUND CARE) ×2 IMPLANT
DRAPE FEMORAL ANGIO 80X135IN (DRAPES) ×2 IMPLANT
DRAPE HALF SHEET 40X57 (DRAPES) IMPLANT
DRAPE INCISE IOBAN 66X45 STRL (DRAPES) ×2 IMPLANT
DRAPE X-RAY CASS 24X20 (DRAPES) IMPLANT
ELECT REM PT RETURN 9FT ADLT (ELECTROSURGICAL) ×2
ELECTRODE REM PT RTRN 9FT ADLT (ELECTROSURGICAL) ×1 IMPLANT
EVACUATOR SILICONE 100CC (DRAIN) ×2 IMPLANT
GAUZE 4X4 16PLY ~~LOC~~+RFID DBL (SPONGE) ×2 IMPLANT
GAUZE XEROFORM 5X9 LF (GAUZE/BANDAGES/DRESSINGS) ×4 IMPLANT
GLOVE SURG POLYISO LF SZ7.5 (GLOVE) ×2 IMPLANT
GLOVE SURG UNDER POLY LF SZ7.5 (GLOVE) ×2 IMPLANT
GOWN STRL REUS W/ TWL LRG LVL3 (GOWN DISPOSABLE) ×2 IMPLANT
GOWN STRL REUS W/ TWL XL LVL3 (GOWN DISPOSABLE) ×1 IMPLANT
GOWN STRL REUS W/TWL LRG LVL3 (GOWN DISPOSABLE) ×2
GOWN STRL REUS W/TWL XL LVL3 (GOWN DISPOSABLE) ×1
GUIDEWIRE ANGLED .035X150CM (WIRE) IMPLANT
HEMOSTAT SNOW SURGICEL 2X4 (HEMOSTASIS) IMPLANT
KIT BASIN OR (CUSTOM PROCEDURE TRAY) ×2 IMPLANT
KIT TURNOVER KIT B (KITS) ×2 IMPLANT
MARKER GRAFT CORONARY BYPASS (MISCELLANEOUS) IMPLANT
NEEDLE PERC 18GX7CM (NEEDLE) ×2 IMPLANT
NS IRRIG 1000ML POUR BTL (IV SOLUTION) ×4 IMPLANT
PACK PERIPHERAL VASCULAR (CUSTOM PROCEDURE TRAY) ×2 IMPLANT
PACK SURGICAL SETUP 50X90 (CUSTOM PROCEDURE TRAY) ×2 IMPLANT
PAD ABD 8X10 STRL (GAUZE/BANDAGES/DRESSINGS) ×2 IMPLANT
PAD ARMBOARD 7.5X6 YLW CONV (MISCELLANEOUS) ×4 IMPLANT
POWDER SURGICEL 3.0 GRAM (HEMOSTASIS) ×2 IMPLANT
PROTECTION STATION PRESSURIZED (MISCELLANEOUS) ×2
SET COLLECT BLD 21X3/4 12 (NEEDLE) IMPLANT
SET MICROPUNCTURE 5F STIFF (MISCELLANEOUS) ×2 IMPLANT
SHEATH AVANTI 11CM 5FR (SHEATH) IMPLANT
SPONGE T-LAP 18X18 ~~LOC~~+RFID (SPONGE) ×8 IMPLANT
STAPLER VISISTAT 35W (STAPLE) ×2 IMPLANT
STATION PROTECTION PRESSURIZED (MISCELLANEOUS) ×1 IMPLANT
STOPCOCK 4 WAY LG BORE MALE ST (IV SETS) IMPLANT
STOPCOCK MORSE 400PSI 3WAY (MISCELLANEOUS) ×2 IMPLANT
SURGIFLO W/THROMBIN 8M KIT (HEMOSTASIS) ×4 IMPLANT
SUT ETHILON 2 LR (SUTURE) ×14 IMPLANT
SUT ETHILON 3 0 PS 1 (SUTURE) IMPLANT
SUT PROLENE 5 0 C 1 24 (SUTURE) ×2 IMPLANT
SUT PROLENE 6 0 BV (SUTURE) ×10 IMPLANT
SUT PROLENE 7 0 BV 1 (SUTURE) IMPLANT
SUT SILK 2 0 SH (SUTURE) ×2 IMPLANT
SUT SILK 3 0 (SUTURE)
SUT SILK 3-0 18XBRD TIE 12 (SUTURE) IMPLANT
SUT VIC AB 2-0 CT1 27 (SUTURE) ×2
SUT VIC AB 2-0 CT1 TAPERPNT 27 (SUTURE) ×2 IMPLANT
SUT VIC AB 3-0 SH 27 (SUTURE) ×2
SUT VIC AB 3-0 SH 27X BRD (SUTURE) ×2 IMPLANT
SUT VICRYL 4-0 PS2 18IN ABS (SUTURE) ×4 IMPLANT
SYR 10ML LL (SYRINGE) ×6 IMPLANT
SYR 20ML LL LF (SYRINGE) ×2 IMPLANT
SYR 30ML LL (SYRINGE) ×2 IMPLANT
SYR 3ML LL SCALE MARK (SYRINGE) ×2 IMPLANT
SYR MEDRAD MARK V 150ML (SYRINGE) IMPLANT
TAPE UMBILICAL COTTON 1/8X30 (MISCELLANEOUS) IMPLANT
TOWEL GREEN STERILE (TOWEL DISPOSABLE) ×4 IMPLANT
TRAY FOLEY MTR SLVR 16FR STAT (SET/KITS/TRAYS/PACK) ×2 IMPLANT
TUBING EXTENTION W/L.L. (IV SETS) IMPLANT
TUBING HIGH PRESSURE 120CM (CONNECTOR) ×2 IMPLANT
UNDERPAD 30X36 HEAVY ABSORB (UNDERPADS AND DIAPERS) ×2 IMPLANT
WATER STERILE IRR 1000ML POUR (IV SOLUTION) ×2 IMPLANT
WIRE BENTSON .035X145CM (WIRE) ×2 IMPLANT

## 2021-02-26 NOTE — Progress Notes (Signed)
Pt admitted to rm 14 from PACU. Initiated tele. CHG wipe given. Call bell within reach. Oriented pt to the unit.   Lawson Radar, RN

## 2021-02-26 NOTE — ED Provider Notes (Signed)
Emergency Medicine Provider Triage Evaluation Note  Sean [name] , a 50 y.o. male  was evaluated in triage.  Pt complains of worsening wound to right lower leg.  He underwent right SFA to tibioperoneal trunk bypass with reversed, ipsilateral, translocated greater saphenous vein with Dr. Randie Heinz on 01/23/21.  States wound has since started to open up more and he has increased pain to right lower leg.  He has noticed some increased swelling and his sensation is decreased from baseline.    Review of Systems  Positive: Right leg pain Negative: Fever, chills  Physical Exam  BP 115/88   Pulse 72   Temp 98.3 F (36.8 C) (Oral)   Resp 16   SpO2 99%  Gen:   Awake, no distress   Resp:  Normal effort  MSK:   Left AKA with prosthesis in place Other:  Wound to medial right calf, appears to have dehisced, distal leg and foot are cool to the touch but with visible erythema, able to wiggles toes and has sensation but reports decreased from baseline, cap refill seems delayed     Medical Decision Making  Medically screening exam initiated at 6:22 AM.  Appropriate orders placed.  Braeton Wolgamott was informed that the remainder of the evaluation will be completed by another provider, this initial triage assessment does not replace that evaluation, and the importance of remaining in the ED until their evaluation is complete.  Wound today appears to have larger area of dehiscence when compared with office visit photo from 02/21/21.  There is some erythema to the leg but is cool to touch.  Appears to have maintained motor function distally but reports sensation decreased from baseline, cap refill seems delay.  Difficulty getting a good pulse with doppler.  Relayed information to charge RN, asked to prioritize room assignment.  Likely will need vascular surgery consult.   Garlon Hatchet, PA-C 02/26/21 2620    Nira Conn, MD 02/26/21 [phone]

## 2021-02-26 NOTE — Progress Notes (Signed)
Dr. Myra Gianotti paged at 1255 to inquire on if/when heparin drip  Per Dr. Myra Gianotti DO NOT stop heparin drip

## 2021-02-26 NOTE — Interval H&P Note (Signed)
History and Physical Interval Note:  02/26/2021 1:39 PM  Sean [name]  has presented today for surgery, with the diagnosis of LEG PAIN.  The various methods of treatment have been discussed with the patient and family. After consideration of risks, benefits and other options for treatment, the patient has consented to  Procedure(s): LOWER EXTREMITY BYPASS THROMBECTOMY (Right) as a surgical intervention.  The patient's history has been reviewed, patient examined, no change in status, stable for surgery.  I have reviewed the patient's chart and labs.  Questions were answered to the patient's satisfaction.     Durene Cal

## 2021-02-26 NOTE — Progress Notes (Signed)
ANTICOAGULATION CONSULT NOTE - Initial Consult  Pharmacy Consult for heparin Indication:  thrombosed fem-pop graft with R popliteal artery occlusion  No Known Allergies  Patient Measurements: Height: 6\' 5"  (195.6 cm) Weight: (!) 152 kg (335 lb 1.6 oz) IBW/kg (Calculated) : 89.1 Heparin Dosing Weight: 123.6   Vital Signs: Temp: 98 F (36.7 C) (07/06 0800) Temp Source: Oral (07/06 0614) BP: 124/86 (07/06 1131) Pulse Rate: 73 (07/06 1131)  Labs: Recent Labs    02/26/21 0630  HGB 14.4  HCT 43.8  PLT 244  CREATININE 0.87    Estimated Creatinine Clearance: 162.4 mL/min (by C-G formula based on SCr of 0.87 mg/dL).  Assessment: 50 yo M s/p R superficial femoral artery to tibioperoneal trunk bypass with ipsilateral greater saphenous vein 01/23/21. Coming in with R leg swelling + pain. RLE dopplers with occlusion of R popliteal artery.  Goal of Therapy:  Heparin level 0.3-0.7 units/ml Monitor platelets by anticoagulation protocol: Yes   Plan:  Heparin bolus of 7000 units x1 Start heparin infusion at 2100 units/hr -Monitor daily HL, CBC, and any s/sx of bleeding -F/u 6h HL @ 1700 on 7/6  9/6, PharmD, [facility] Emergency Medicine Clinical Pharmacist ED RPh Phone: [phone] Main RX: [phone]

## 2021-02-26 NOTE — ED Notes (Signed)
Patient transported to Ultrasound 

## 2021-02-26 NOTE — ED Provider Notes (Signed)
[facility] Provider Note   CSN: [id number] Arrival date & time: 02/26/21  0601     History Chief Complaint  Patient presents with   Leg Pain    Sean [name] is a 50 y.o. male.  The history is provided by the patient and medical records.  Leg Pain Sean [name] is a 50 y.o. male who presents to the Emergency Department complaining of leg pain.  He presents to the ED for evaluation of left leg pain. On July 2 he had a right lower extremity from pop bypass. He states that about one week ago he accidentally fell and bumped his leg and the wound started to open up. He was seen in the office on Friday and he appeared to be doing well at that time. He states that last night his pain significantly increased from the calf down to the foot. Pain is described as throbbing and constant in nature. He denies any fevers, cough, shortness of breath, nausea, vomiting. He is compliant with his home medications. He takes Plavix and aspirin.    Past Medical History:  Diagnosis Date   Acquired deformity of right hand 11/27/2015   Formatting of this note might be different from the original. Secondary to injury/surgeries   AKA stump complication (HCC) 02/13/2020   Arthritis 05/06/2014   Carpal tunnel syndrome 05/06/2014   Chronic pain syndrome 02/13/2020   Class 2 severe obesity due to excess calories with serious comorbidity and body mass index (BMI) of 35.0 to 35.9 in adult [facility](HCC) 12/10/2020   Depression    Dislocation of distal interphalangeal (DIP) joint of finger 05/06/2014   Encounter for immunization 12/05/2020   Erectile dysfunction due to diseases classified elsewhere 04/06/2020   Essential hypertension 11/27/2015   History of left below knee amputation (HCC) 05/16/2019   Hypertension    Impaired mobility and ADLs 05/16/2019   Ischemia of extremity 09/21/2018   Ischemia of left lower extremity 04/28/2019   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 Added automatically from request for surgery [id number]   Other long term (current) drug therapy 12/05/2020   PAD (peripheral artery disease) (HCC) 04/28/2019   Phantom limb syndrome with pain (HCC) 09/12/2019   Formatting of this note might be different from the original. 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   Tobacco use disorder 12/10/2020   Weak urinary stream 12/10/2020    Patient Active Problem List   Diagnosis Date Noted   Vitamin B12 deficiency (non anemic) 01/21/2021   Dyslipidemia 01/15/2021   Hypertension    Class 2 severe obesity due to excess calories with serious comorbidity and body mass index (BMI) of 35.0 to 35.9 in adult [facility](HCC) 12/10/2020   Tobacco use disorder 12/10/2020   Urinary urgency 12/10/2020   Weak urinary stream 12/10/2020   Preop cardiovascular exam 12/05/2020   Other long term (current) drug therapy 12/05/2020   Tinea cruris 12/05/2020   Erectile dysfunction due to diseases classified elsewhere 04/06/2020   AKA stump complication (HCC) 02/13/2020   Chronic pain syndrome 02/13/2020   Neuropathic pain 11/10/2019   Phantom limb syndrome with pain (HCC) 09/12/2019   History of left below knee amputation (HCC) 05/16/2019   Impaired mobility and ADLs 05/16/2019   Ischemia of left lower extremity 04/28/2019   PAD (peripheral artery  disease) (HCC) 04/28/2019   Ischemia of extremity 09/21/2018   PVD (peripheral vascular disease) (HCC) 09/20/2018   Acquired deformity of right hand 11/27/2015   Essential hypertension 11/27/2015   Vitamin D deficiency 11/27/2015   Arthritis 05/06/2014   Carpal tunnel syndrome 05/06/2014   Dislocation of distal interphalangeal (DIP) joint of finger 05/06/2014   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    Past Surgical  History:  Procedure Laterality Date   ABDOMINAL AORTOGRAM W/LOWER EXTREMITY N/A 05/08/2019   Procedure: ABDOMINAL AORTOGRAM W/LOWER EXTREMITY;  Surgeon: Maeola Harman, MD;  Location: [facility] INVASIVE CV LAB;  Service: Cardiovascular;  Laterality: N/A;   ABDOMINAL AORTOGRAM W/LOWER EXTREMITY N/A 12/23/2020   Procedure: ABDOMINAL AORTOGRAM W/LOWER EXTREMITY;  Surgeon: Maeola Harman, MD;  Location: [facility] INVASIVE CV LAB;  Service: Cardiovascular;  Laterality: N/A;   AMPUTATION Left 01/30/2020   Procedure: LEFT ABOVE KNEE AMPUTATION;  Surgeon: Chuck Hint, MD;  Location: [facility] OR;  Service: Vascular;  Laterality: Left;   ANGIOPLASTY Left 04/27/2019   Procedure: Balloon Angioplasty of left posterior tibial artery;  Surgeon: Maeola Harman, MD;  Location: [facility] OR;  Service: Vascular;  Laterality: Left;   EMBOLECTOMY Left 09/21/2018   Procedure: THROMBECTOMY LEFT POPLITEAL ARTERY BYPASS LEFT POPLITEAL TO TIBIAL ARTERY WITH SAPHENOUS VEIN HARVEST - FASCIOTOMY;  Surgeon: Chuck Hint, MD;  Location: [facility] OR;  Service: Vascular;  Laterality: Left;   FEMORAL-POPLITEAL BYPASS GRAFT Right 01/23/2021   Procedure: RIGHT LOWER EXTREMITY FEMORAL-POPLITEAL ARTERY BYPASS;  Surgeon: Maeola Harman, MD;  Location: [facility] OR;  Service: Vascular;  Laterality: Right;   finger surgery Right    small finger  (2 different surgeries)   LOWER EXTREMITY ANGIOGRAM Left 04/27/2019   Procedure: LEFT LOWER EXTREMITY ANGIOGRAM;  Surgeon: Maeola Harman, MD;  Location: [facility] OR;  Service: Vascular;  Laterality: Left;   PERIPHERAL VASCULAR BALLOON ANGIOPLASTY Left 05/08/2019   Procedure: PERIPHERAL VASCULAR BALLOON ANGIOPLASTY;  Surgeon: Maeola Harman, MD;  Location: [facility] INVASIVE CV LAB;  Service: Cardiovascular;  Laterality: Left;  Posterior tibial   PERIPHERAL VASCULAR INTERVENTION Left 04/28/2019   Procedure: PERIPHERAL VASCULAR INTERVENTION;  Surgeon: Cephus Shelling, MD;   Location: [facility] INVASIVE CV LAB;  Service: Cardiovascular;  Laterality: Left;   PERIPHERAL VASCULAR THROMBECTOMY Bilateral 04/28/2019   Procedure: PERIPHERAL VASCULAR THROMBECTOMY;  Surgeon: Cephus Shelling, MD;  Location: [facility] INVASIVE CV LAB;  Service: Cardiovascular;  Laterality: Bilateral;   PERIPHERAL VASCULAR THROMBECTOMY Left 05/08/2019   Procedure: PERIPHERAL VASCULAR THROMBECTOMY;  Surgeon: Maeola Harman, MD;  Location: [facility] INVASIVE CV LAB;  Service: Cardiovascular;  Laterality: Left;  posterior tibial   TRANSMETATARSAL AMPUTATION Left 05/09/2019   Procedure: Left Below Knee Amputation;  Surgeon: Maeola Harman, MD;  Location: [facility] OR;  Service: Vascular;  Laterality: Left;       Family History  Problem Relation Age of Onset   Diabetes Father     Social History   Tobacco Use   Smoking status: Every Day    Packs/day: 1.00    Pack years: 0.00    Types: Cigarettes   Smokeless tobacco: Never  Vaping Use   Vaping Use: Never used  Substance Use Topics   Alcohol use: Never   Drug use: Never    Home Medications Prior to Admission medications   Medication Sig Start Date End Date Taking? Authorizing Provider  Adalimumab (HUMIRA PEN) 40 MG/0.4ML PNKT Inject 40 mg into the skin every 14 (fourteen) days.    [provider]  amLODipine (NORVASC) 10 MG tablet Take 10 mg by mouth in the morning. 11/23/18   [provider]  aspirin EC 81 MG tablet Take 81 mg by mouth in the morning.    [provider]  Buprenorphine HCl (BELBUCA) 300 MCG FILM Place 300 mcg inside cheek in the morning and at bedtime. 02/13/20   [provider]  clobetasol ointment (TEMOVATE) 0.05 % Apply 1 application topically 2 (two) times daily as needed (skin irritation). 06/02/20   [provider]  clopidogrel (PLAVIX) 75 MG tablet Take 1 tablet (75 mg total) by mouth daily with breakfast. 04/30/19   Emilie Rutter, PA-C  cyanocobalamin (,VITAMIN B-12,)  1000 MCG/ML injection Inject 1,000 mcg into the muscle once a week.    [provider]  DULoxetine (CYMBALTA) 30 MG capsule Take 30 mg by mouth in the morning. 01/09/20   [provider]  hydrALAZINE (APRESOLINE) 10 MG tablet Take 10 mg by mouth 2 (two) times daily. 12/20/20   [provider]  olmesartan-hydrochlorothiazide (BENICAR HCT) 40-25 MG tablet Take 1 tablet by mouth daily. 12/18/20   [provider]  pregabalin (LYRICA) 300 MG capsule Take 300 mg by mouth daily.    [provider]  rosuvastatin (CRESTOR) 20 MG tablet Take 1 tablet (20 mg total) by mouth daily. 01/15/21   Georgeanna Lea, MD  Testosterone 20.25 MG/ACT (1.62%) GEL Apply 40.5 mg topically daily. Rub on shoulder 12/27/20   [provider]  vitamin B-12 (CYANOCOBALAMIN) 250 MCG tablet Take 250 mcg by mouth daily.    [provider]  Vitamin D, Ergocalciferol, (DRISDOL) 1.25 MG (50000 UNIT) CAPS capsule Take 50,000 Units by mouth every Thursday. 12/11/20   [provider]  zinc gluconate 50 MG tablet Take 50 mg by mouth daily.    [provider]    Allergies    Patient has no known allergies.  Review of Systems   Review of Systems  All other systems reviewed and are negative.  Physical Exam Updated Vital Signs BP (!) 125/99   Pulse 65   Temp 97.8 F (36.6 C) (Oral)   Resp 18   Ht 6\' 5"  (1.956 m)   Wt (!) 152 kg   SpO2 100%   BMI 39.74 kg/m   Physical Exam Vitals and nursing note reviewed.  Constitutional:      Appearance: He is well-developed.  HENT:     Head: Normocephalic and atraumatic.  Cardiovascular:     Rate and Rhythm: Normal rate and regular rhythm.     Heart sounds: No murmur heard. Pulmonary:     Effort: Pulmonary effort is normal. No respiratory distress.     Breath sounds: Normal breath sounds.  Abdominal:     Palpations: Abdomen is soft.     Tenderness: There is no abdominal tenderness. There is no guarding or  rebound.  Musculoskeletal:     Comments: Left lower extremity a.k.a. The right lower extremity has a D hist wound to the medial calf. There is redness to the leg with localized edema. There are no palpable pulses to the femoral, popliteal, DP and PT area. There are Doppler will biphasic pulses to the femoral area. There are multiphasic Doppler's to DP and PT. Foot is cool to the touch with delayed cap refill. He does have tenderness to palpation throughout the calf and foot.  Skin:    General: Skin is warm and dry.  Neurological:     Mental Status: He  is alert and oriented to person, place, and time.  Psychiatric:        Behavior: Behavior normal.     ED Results / Procedures / Treatments   Labs (all labs ordered are listed, but only abnormal results are displayed) Labs Reviewed  CBC WITH DIFFERENTIAL/PLATELET - Abnormal; Notable for the following components:      Result Value   Lymphs Abs 4.4 (*)    All other components within normal limits  COMPREHENSIVE METABOLIC PANEL - Abnormal; Notable for the following components:   Potassium 3.4 (*)    Glucose, Bld 119 (*)    All other components within normal limits  RESP PANEL BY RT-PCR (FLU A&B, COVID) [id number]  CULTURE, BLOOD (ROUTINE X 2)  CULTURE, BLOOD (ROUTINE X 2)  LACTIC ACID, PLASMA  PROTIME-INR  HEPARIN LEVEL (UNFRACTIONATED)    EKG None  Radiology VAS [place] ABI WITH/WO TBI  Result Date: 02/26/2021  LOWER EXTREMITY DOPPLER STUDY Patient Name:  Sean [name]  Date of Exam:   02/26/2021 Medical Rec #: [id number]       Accession #:    [id number] Date of Birth: 30-Mar-1971        Patient Gender: M Patient Age:   49Y Exam Location:  [facility] [address] Procedure:      VAS [place] ABI WITH/WO TBI Referring Phys: [id number] Rande Brunt HAWKEN --------------------------------------------------------------------------------   Vascular Interventions: Status post right SFA to tibioperoneal trunk bypass with                         greater saphenous vein on  01/23/21                         Left BKA 05/09/2019, left popliteal to posterior tibial                         artery bypass. Performing Technologist: Marilynne Halsted RDMS, RVT  Examination Guidelines: A complete evaluation includes at minimum, Doppler waveform signals and systolic blood pressure reading at the level of bilateral brachial, anterior tibial, and posterior tibial arteries, when vessel segments are accessible. Bilateral testing is considered an integral part of a complete examination. Photoelectric Plethysmograph (PPG) waveforms and toe systolic pressure readings are included as required and additional duplex testing as needed. Limited examinations for reoccurring indications may be performed as noted.  ABI Findings: +--------+------------------+-----+----------+--------+ Right   Rt Pressure (mmHg)IndexWaveform  Comment  +--------+------------------+-----+----------+--------+ [id number]                    triphasic          +--------+------------------+-----+----------+--------+ PTA     91                0.62 monophasic         +--------+------------------+-----+----------+--------+ DP      67                0.46 monophasic         +--------+------------------+-----+----------+--------+ +--------+------------------+-----+---------+-------+ Left    Lt Pressure (mmHg)IndexWaveform Comment +--------+------------------+-----+---------+-------+ [id number]                    triphasic        +--------+------------------+-----+---------+-------+ +-------+-----------+--------------+------------+------------+ ABI/TBIToday's ABIToday's TBI   Previous ABIPrevious TBI +-------+-----------+--------------+------------+------------+ Right  0.62       not obtainable0.61        0.26         +-------+-----------+--------------+------------+------------+  Left   BKA                                                +-------+-----------+--------------+------------+------------+  Summary: Right: Resting right ankle-brachial index indicates moderate right lower extremity arterial disease.  *See table(s) above for measurements and observations.     Preliminary    VAS [place] LOWER EXTREMITY ARTERIAL DUPLEX  Result Date: 02/26/2021 LOWER EXTREMITY ARTERIAL DUPLEX STUDY Patient Name:  Sean [name]  Date of Exam:   02/26/2021 Medical Rec #: [id number]       Accession #:    [id number] Date of Birth: 1970-10-10        Patient Gender: M Patient Age:   1Y Exam Location:  [place] Procedure:      VAS [place] LOWER EXTREMITY ARTERIAL DUPLEX Referring Phys: [id number] Rande Brunt HAWKEN --------------------------------------------------------------------------------   Vascular Interventions: Status post right SFA to Tibioperoneal trunk bypass with                         greater saphenous vein on 01/23/21. Left BKA 05/09/2019. Current ABI:            Right 0.62 Performing Technologist: [place] Sink [place]ant RDMS, RVT  Examination Guidelines: A complete evaluation includes B-mode imaging, spectral Doppler, color Doppler, and power Doppler as needed of all accessible portions of each vessel. Bilateral testing is considered an integral part of a complete examination. Limited examinations for reoccurring indications may be performed as noted.  +----------+--------+-----+--------+-------------------+--------+ RIGHT     PSV cm/sRatioStenosisWaveform           Comments +----------+--------+-----+--------+-------------------+--------+ CFA Distal69                   biphasic                    +----------+--------+-----+--------+-------------------+--------+ SFA Prox  57                   biphasic                    +----------+--------+-----+--------+-------------------+--------+ SFA Mid   78                   biphasic                    +----------+--------+-----+--------+-------------------+--------+ SFA Distal87                    biphasic                    +----------+--------+-----+--------+-------------------+--------+ POP Prox  38                                               +----------+--------+-----+--------+-------------------+--------+ POP Mid                occluded                            +----------+--------+-----+--------+-------------------+--------+ POP Distal             occluded                            +----------+--------+-----+--------+-------------------+--------+  TP Trunk               occluded                            +----------+--------+-----+--------+-------------------+--------+ PTA Prox  28                   monophasic                  +----------+--------+-----+--------+-------------------+--------+ PTA Distal38                                               +----------+--------+-----+--------+-------------------+--------+ PERO Prox                      dampened monophasic         +----------+--------+-----+--------+-------------------+--------+ PERO Mid  24                   monophasic                  +----------+--------+-----+--------+-------------------+--------+ DP        21                   dampened monophasic         +----------+--------+-----+--------+-------------------+--------+  Right Graft #1: Superficial femoral to tibioperoneal trunk +------------------+--------+--------+--------+--------+                   PSV cm/sStenosisWaveformComments +------------------+--------+--------+--------+--------+ Inflow            124                              +------------------+--------+--------+--------+--------+ Prox Anastomosis          occluded                 +------------------+--------+--------+--------+--------+ Proximal Graft            occluded                 +------------------+--------+--------+--------+--------+ Mid Graft                 occluded                  +------------------+--------+--------+--------+--------+ Distal Graft              occluded                 +------------------+--------+--------+--------+--------+ Distal Anastomosis        occluded                 +------------------+--------+--------+--------+--------+ Outflow                                            +------------------+--------+--------+--------+--------+   Summary: Right: Right popliteal artery and tibioperoneal trunk appeared occluded. Bypass graft was technically difficult to visualized which may be occluded.  See table(s) above for measurements and observations.    Preliminary     Procedures Procedures   Medications Ordered in ED Medications  heparin ADULT infusion 100 units/mL (25000 units/275mL) (2,100 Units/hr Intravenous New Bag/Given 02/26/21 1229)  lactated ringers infusion (has no administration in time range)  heparin bolus  via infusion 7,000 Units (7,000 Units Intravenous Bolus from Bag 02/26/21 1229)  chlorhexidine (PERIDEX) 0.12 % solution 15 mL (15 mLs Mouth/Throat Given 02/26/21 1320)    Or  MEDLINE mouth rinse ( Mouth Rinse See Alternative 02/26/21 1320)    ED Course  I have reviewed the triage vital signs and the nursing notes.  Pertinent labs & imaging results that were available during my care of the patient were reviewed by me and considered in my medical decision making (see chart for details).    MDM Rules/Calculators/A&P                         patient with history of recent phone pop bypass here for evaluation of increased pain from the calf to the foot. He does have decreased perfusion to the foot. Discussed with Dr. Butch Penny, with vascular surgery. He will see the patient in consult.  Final Clinical Impression(s) / ED Diagnoses Final diagnoses:  None    Rx / DC Orders ED Discharge Orders     None        Tilden Fossa, MD 02/26/21 1337

## 2021-02-26 NOTE — Anesthesia Postprocedure Evaluation (Signed)
Anesthesia Post Note  Patient: Sean [name]  Procedure(s) Performed: RIGHT LOWER EXTREMITY  THROMBECTOMY (Right)     Patient location during evaluation: PACU Anesthesia Type: General Level of consciousness: awake and alert, patient cooperative and oriented Pain management: pain level controlled Vital Signs Assessment: post-procedure vital signs reviewed and stable Respiratory status: spontaneous breathing, nonlabored ventilation and respiratory function stable Cardiovascular status: blood pressure returned to baseline and stable Postop Assessment: no apparent nausea or vomiting Anesthetic complications: no   No notable events documented.  Last Vitals:  Vitals:   02/26/21 1725 02/26/21 1730  BP: 116/68   Pulse: 85 87  Resp: 17 18  Temp:  36.7 C  SpO2: 99% 98%    Last Pain:  Vitals:   02/26/21 1730  TempSrc:   PainSc: 0-No pain                 Oriel Rumbold,E. Cylis Ayars

## 2021-02-26 NOTE — ED Triage Notes (Signed)
Patient arrived with EMS from home reports increasing pain at right lower leg with swelling and drainage at dehisced incision site last week . Denies fever or chills .

## 2021-02-26 NOTE — H&P (View-Only) (Signed)
[facility] [place]  ASSESSMENT / PLAN: 50 y.o. male status post right superficial femoral artery to tibioperoneal trunk bypass with ipsilateral greater saphenous vein 01/23/21. Superficial dehiscence of calf incision which appears unchanged from office evaluation. He has pain and tingling in his foot similar to prior to his bypass. Will check an arterial duplex and ABI. Further recommendations after these studies.   CHIEF COMPLAINT: right leg swelling and pain  HISTORY OF PRESENT ILLNESS: Sean [name] is a 50 y.o. male status post right superficial femoral artery to tibioperoneal trunk bypass with ipsilateral greater saphenous vein 01/23/21. He presents to the ER today with worsening discomfort in his right foot. He reports his foot feels like it did before the bypass surgery. He is status post left above knee amputation. He has known superficial dehiscence of his right calf which appears unchanged from prior.   Past Medical History:  Diagnosis Date   Acquired deformity of right hand 11/27/2015   Formatting of this note might be different from the original. Secondary to injury/surgeries   AKA stump complication (HCC) 02/13/2020   Arthritis 05/06/2014   Carpal tunnel syndrome 05/06/2014   Chronic pain syndrome 02/13/2020   Class 2 severe obesity due to excess calories with serious comorbidity and body mass index (BMI) of 35.0 to 35.9 in adult [facility]) 12/10/2020   Depression    Dislocation of distal interphalangeal (DIP) joint of finger 05/06/2014   Encounter for immunization 12/05/2020   Erectile dysfunction due to diseases classified elsewhere 04/06/2020   Essential hypertension 11/27/2015   History of left below knee amputation (HCC) 05/16/2019   Hypertension    Impaired mobility and ADLs 05/16/2019   Ischemia of extremity 09/21/2018   Ischemia of left lower extremity 04/28/2019   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 Added automatically from request for surgery [id number]   Other long term (current) drug therapy 12/05/2020   PAD (peripheral artery disease) (HCC) 04/28/2019   Phantom limb syndrome with pain (HCC) 09/12/2019   Formatting of this note might be different from the original. 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   Tobacco use disorder 12/10/2020   Weak urinary stream 12/10/2020    Past Surgical History:  Procedure Laterality Date   ABDOMINAL AORTOGRAM W/LOWER EXTREMITY N/A 05/08/2019   Procedure: ABDOMINAL AORTOGRAM W/LOWER EXTREMITY;  Surgeon: Maeola Harman, MD;  Location: [facility] INVASIVE CV LAB;  Service: Cardiovascular;  Laterality: N/A;   ABDOMINAL AORTOGRAM W/LOWER EXTREMITY N/A 12/23/2020   Procedure: ABDOMINAL AORTOGRAM W/LOWER EXTREMITY;  Surgeon: Maeola Harman, MD;  Location: [facility] INVASIVE CV LAB;  Service: Cardiovascular;  Laterality: N/A;   AMPUTATION Left 01/30/2020   Procedure: LEFT ABOVE KNEE AMPUTATION;  Surgeon: Chuck Hint, MD;  Location: [facility] OR;  Service: Vascular;  Laterality: Left;   ANGIOPLASTY Left 04/27/2019   Procedure: Balloon Angioplasty of left posterior tibial artery;  Surgeon: Maeola Harman, MD;  Location: [facility] OR;  Service: Vascular;  Laterality: Left;   EMBOLECTOMY Left 09/21/2018   Procedure: THROMBECTOMY LEFT POPLITEAL ARTERY BYPASS LEFT POPLITEAL TO TIBIAL ARTERY WITH SAPHENOUS VEIN HARVEST - FASCIOTOMY;  Surgeon: Chuck Hint, MD;  Location: [facility] OR;  Service: Vascular;  Laterality: Left;   FEMORAL-POPLITEAL BYPASS GRAFT Right 01/23/2021   Procedure: RIGHT LOWER EXTREMITY FEMORAL-POPLITEAL ARTERY BYPASS;  Surgeon: Randie Heinz,  Dennard Schaumann, MD;  Location: [facility] OR;  Service: Vascular;  Laterality: Right;   finger surgery Right    small finger  (2 different surgeries)   LOWER EXTREMITY  ANGIOGRAM Left 04/27/2019   Procedure: LEFT LOWER EXTREMITY ANGIOGRAM;  Surgeon: Maeola Harman, MD;  Location: [facility] OR;  Service: Vascular;  Laterality: Left;   PERIPHERAL VASCULAR BALLOON ANGIOPLASTY Left 05/08/2019   Procedure: PERIPHERAL VASCULAR BALLOON ANGIOPLASTY;  Surgeon: Maeola Harman, MD;  Location: [facility] INVASIVE CV LAB;  Service: Cardiovascular;  Laterality: Left;  Posterior tibial   PERIPHERAL VASCULAR INTERVENTION Left 04/28/2019   Procedure: PERIPHERAL VASCULAR INTERVENTION;  Surgeon: Cephus Shelling, MD;  Location: [facility] INVASIVE CV LAB;  Service: Cardiovascular;  Laterality: Left;   PERIPHERAL VASCULAR THROMBECTOMY Bilateral 04/28/2019   Procedure: PERIPHERAL VASCULAR THROMBECTOMY;  Surgeon: Cephus Shelling, MD;  Location: [facility] INVASIVE CV LAB;  Service: Cardiovascular;  Laterality: Bilateral;   PERIPHERAL VASCULAR THROMBECTOMY Left 05/08/2019   Procedure: PERIPHERAL VASCULAR THROMBECTOMY;  Surgeon: Maeola Harman, MD;  Location: [facility] INVASIVE CV LAB;  Service: Cardiovascular;  Laterality: Left;  posterior tibial   TRANSMETATARSAL AMPUTATION Left 05/09/2019   Procedure: Left Below Knee Amputation;  Surgeon: Maeola Harman, MD;  Location: [facility] OR;  Service: Vascular;  Laterality: Left;    Family History  Problem Relation Age of Onset   Diabetes Father     Social History   Socioeconomic History   Marital status: Married    Spouse name: Not on file   Number of children: Not on file   Years of education: Not on file   Highest education level: Not on file  Occupational History   Not on file  Tobacco Use   Smoking status: Every Day    Packs/day: 1.00    Pack years: 0.00    Types: Cigarettes   Smokeless tobacco: Never  Vaping Use   Vaping Use: Never used  Substance and Sexual Activity   Alcohol use: Never   Drug use: Never   Sexual activity: Not on file  Other Topics Concern   Not on file  Social History Narrative   Not on file    Social Determinants of Health   Financial Resource Strain: Not on file  Food Insecurity: Not on file  Transportation Needs: Not on file  Physical Activity: Not on file  Stress: Not on file  Social Connections: Not on file  Intimate Partner Violence: Not on file    No Known Allergies  No current facility-administered medications for this encounter.   Current Outpatient Medications  Medication Sig Dispense Refill   Adalimumab (HUMIRA PEN) 40 MG/0.4ML PNKT Inject 40 mg into the skin every 14 (fourteen) days.     amLODipine (NORVASC) 10 MG tablet Take 10 mg by mouth in the morning.     aspirin EC 81 MG tablet Take 81 mg by mouth in the morning.     Buprenorphine HCl (BELBUCA) 300 MCG FILM Place 300 mcg inside cheek in the morning and at bedtime.     clobetasol ointment (TEMOVATE) 0.05 % Apply 1 application topically 2 (two) times daily as needed (skin irritation).     clopidogrel (PLAVIX) 75 MG tablet Take 1 tablet (75 mg total) by mouth daily with breakfast. 30 tablet 2   cyanocobalamin (,VITAMIN B-12,) 1000 MCG/ML injection Inject 1,000 mcg into the muscle once a week.     DULoxetine (CYMBALTA) 30 MG capsule Take 30 mg by mouth in the morning.     hydrALAZINE (APRESOLINE)  10 MG tablet Take 10 mg by mouth 2 (two) times daily.     olmesartan-hydrochlorothiazide (BENICAR HCT) 40-25 MG tablet Take 1 tablet by mouth daily.     pregabalin (LYRICA) 300 MG capsule Take 300 mg by mouth daily.     rosuvastatin (CRESTOR) 20 MG tablet Take 1 tablet (20 mg total) by mouth daily. 90 tablet 1   Testosterone 20.25 MG/ACT (1.62%) GEL Apply 40.5 mg topically daily. Rub on shoulder     vitamin B-12 (CYANOCOBALAMIN) 250 MCG tablet Take 250 mcg by mouth daily.     Vitamin D, Ergocalciferol, (DRISDOL) 1.25 MG (50000 UNIT) CAPS capsule Take 50,000 Units by mouth every Thursday.     zinc gluconate 50 MG tablet Take 50 mg by mouth daily.      REVIEW OF SYSTEMS:  [X]  denotes positive finding, [ ]  denotes  negative finding Cardiac  Comments:  Chest pain or chest pressure:    Shortness of breath upon exertion:    Short of breath when lying flat:    Irregular heart rhythm:        Vascular    Pain in calf, thigh, or hip brought on by ambulation:    Pain in feet at night that wakes you up from your sleep:     Blood clot in your veins:    Leg swelling:         Pulmonary    Oxygen at home:    Productive cough:     Wheezing:         Neurologic    Sudden weakness in arms or legs:     Sudden numbness in arms or legs:     Sudden onset of difficulty speaking or slurred speech:    Temporary loss of vision in one eye:     Problems with dizziness:         Gastrointestinal    Blood in stool:     Vomited blood:         Genitourinary    Burning when urinating:     Blood in urine:        Psychiatric    Major depression:         Hematologic    Bleeding problems:    Problems with blood clotting too easily:        Skin    Rashes or ulcers:        Constitutional    Fever or chills:      PHYSICAL EXAM Vitals:   02/26/21 0800 02/26/21 0830 02/26/21 0900 02/26/21 0930  BP: (!) 125/93 (!) 129/91 114/89 114/78  Pulse: 60 63 62 66  Resp: 16 13 14 13   Temp: 98 F (36.7 C)     TempSrc:      SpO2: 100% 100% 100% 100%  Weight:      Height:        Constitutional: well appearing. no distress. Appears well nourished.  Neurologic: CN intact. no focal findings. no sensory loss. Psychiatric:  Mood and affect symmetric and appropriate. Eyes:  No icterus. No conjunctival pallor. Ears, nose, throat:  mucous membranes moist. Midline trachea.  Cardiac: regular rate and rhythm.  Respiratory:  unlabored. Abdominal:  soft, non-tender, non-distended.  Peripheral vascular: Right bypass incisions in thigh are well healed. Superficial dehiscence of the right medial calf wound which appears similar to previous images. Doppler flow in the AT and PT. Normal motor and sensory function of the right foot.  Left above knee amputation. Extremity: 2+  edema from right foot to thigh. no cyanosis. no pallor.  Skin: no gangrene. no ulceration.  Lymphatic: no Stemmer's sign. no palpable lymphadenopathy.  PERTINENT LABORATORY AND RADIOLOGIC DATA  Most recent CBC CBC Latest Ref Rng & Units 02/26/2021 01/24/2021 01/23/2021  WBC 4.0 - 10.5 K/uL 8.9 12.5(H) 14.5(H)  Hemoglobin 13.0 - 17.0 g/dL 10.2 12.8(L) 13.5  Hematocrit 39.0 - 52.0 % 43.8 38.4(L) 41.5  Platelets 150 - 400 K/uL 244 276 276     Most recent CMP CMP Latest Ref Rng & Units 02/26/2021 01/24/2021 01/23/2021  Glucose 70 - 99 mg/dL 585(I) 778(E) -  BUN 6 - 20 mg/dL 7 9 -  Creatinine 4.23 - 1.24 mg/dL 5.36 1.44 3.15  Sodium 135 - 145 mmol/L 141 139 -  Potassium 3.5 - 5.1 mmol/L 3.4(L) 3.7 -  Chloride 98 - 111 mmol/L 105 105 -  CO2 22 - 32 mmol/L 29 24 -  Calcium 8.9 - 10.3 mg/dL 9.3 8.9 -  Total Protein 6.5 - 8.1 g/dL 6.6 - -  Total Bilirubin 0.3 - 1.2 mg/dL 0.9 - -  Alkaline Phos 38 - 126 U/L 68 - -  AST 15 - 41 U/L 24 - -  ALT 0 - 44 U/L 26 - -    Renal function Estimated Creatinine Clearance: 162.4 mL/min (by C-G formula based on SCr of 0.87 mg/dL).  Hgb A1c MFr Bld (%)  Date Value  02/01/2020 6.5 (H)    LDL Cholesterol  Date Value Ref Range Status  02/01/2020 105 (H) 0 - 99 mg/dL Final    Comment:           Total Cholesterol/HDL:CHD Risk Coronary Heart Disease Risk Table                     Men   Women  1/2 Average Risk   3.4   3.3  Average Risk       5.0   4.4  2 X Average Risk   9.6   7.1  3 X Average Risk  23.4   11.0        Use the calculated Patient Ratio above and the CHD Risk Table to determine the patient's CHD Risk.        ATP III CLASSIFICATION (LDL):  <100     mg/dL   Optimal  400-867  mg/dL   Near or Above                    Optimal  130-159  mg/dL   Borderline  619-509  mg/dL   High  >326     mg/dL   Very High Performed at [facility], [address] [place], [place] [address]     Rande Brunt.  Lenell Antu, MD [facility] Phone Number: [phone] 02/26/2021 10:03 AM

## 2021-02-26 NOTE — Anesthesia Preprocedure Evaluation (Addendum)
Anesthesia Evaluation  Patient identified by MRN, date of birth, ID band Patient awake    Reviewed: Allergy & Precautions, NPO status , Patient's Chart, lab work & pertinent test results  Airway Mallampati: I  TM Distance: >3 FB Neck ROM: Full    Dental no notable dental hx. (+) Teeth Intact, Dental Advisory Given   Pulmonary neg pulmonary ROS, Current Smoker and Patient abstained from smoking.,    Pulmonary exam normal breath sounds clear to auscultation       Cardiovascular hypertension, Pt. on medications + Peripheral Vascular Disease  Normal cardiovascular exam Rhythm:Regular Rate:Normal  TTE 2022 1. Bicuspid aortic valve; severely dilated ascending aorta (50 mm);  suggest CTA to further assess.  2. Left ventricular ejection fraction, by estimation, is 50 to 55%. The  left ventricle has low normal function. The left ventricle has no regional  wall motion abnormalities. There is moderate left ventricular hypertrophy.  Left ventricular diastolic  parameters are consistent with Grade I diastolic dysfunction (impaired  relaxation).  3. Right ventricular systolic function is normal. The right ventricular  size is normal.  4. The mitral valve is normal in structure. No evidence of mitral valve  regurgitation. No evidence of mitral stenosis.  5. The aortic valve is bicuspid. Aortic valve regurgitation is not  visualized. No aortic stenosis is present.  6. Aortic dilatation noted. There is moderate dilatation of the aortic  root, measuring 49 mm. There is severe dilatation of the ascending aorta,  measuring 50 mm.  7. The inferior vena cava is normal in size with greater than 50%  respiratory variability, suggesting right atrial pressure of 3 mmHg.    Neuro/Psych PSYCHIATRIC DISORDERS Depression negative neurological ROS     GI/Hepatic negative GI ROS, Neg liver ROS,   Endo/Other  Morbid obesity (BMI 40)   Renal/GU negative Renal ROS  negative genitourinary   Musculoskeletal  (+) Arthritis , H/o left BKA   Abdominal   Peds  Hematology  (+) Blood dyscrasia (on plavix), ,   Anesthesia Other Findings 50 y.o. male status post right superficial femoral artery to tibioperoneal trunk bypass with ipsilateral greater saphenous vein 01/23/21. Superficial dehiscence of calf incision. Presents with pain and tingling in his foot similar to prior to his bypass.Currently on heparin gtt.   Reproductive/Obstetrics                           Anesthesia Physical Anesthesia Plan  ASA: 3 and emergent  Anesthesia Plan: General   Post-op Pain Management:    Induction: Intravenous and Rapid sequence  PONV Risk Score and Plan: Midazolam, Dexamethasone and Ondansetron  Airway Management Planned: Oral ETT  Additional Equipment:   Intra-op Plan:   Post-operative Plan: Extubation in OR  Informed Consent: I have reviewed the patients History and Physical, chart, labs and discussed the procedure including the risks, benefits and alternatives for the proposed anesthesia with the patient or authorized representative who has indicated his/her understanding and acceptance.     Dental advisory given  Plan Discussed with: CRNA  Anesthesia Plan Comments:         Anesthesia Quick Evaluation

## 2021-02-26 NOTE — Consult Note (Signed)
[facility] OF [place]  ASSESSMENT / PLAN: 50 y.o. male status post right superficial femoral artery to tibioperoneal trunk bypass with ipsilateral greater saphenous vein 01/23/21. Superficial dehiscence of calf incision which appears unchanged from office evaluation. He has pain and tingling in his foot similar to prior to his bypass. Will check an arterial duplex and ABI. Further recommendations after these studies.   CHIEF COMPLAINT: right leg swelling and pain  HISTORY OF PRESENT ILLNESS: Sean [name] is a 50 y.o. male status post right superficial femoral artery to tibioperoneal trunk bypass with ipsilateral greater saphenous vein 01/23/21. He presents to the ER today with worsening discomfort in his right foot. He reports his foot feels like it did before the bypass surgery. He is status post left above knee amputation. He has known superficial dehiscence of his right calf which appears unchanged from prior.   Past Medical History:  Diagnosis Date   Acquired deformity of right hand 11/27/2015   Formatting of this note might be different from the original. Secondary to injury/surgeries   AKA stump complication (HCC) 02/13/2020   Arthritis 05/06/2014   Carpal tunnel syndrome 05/06/2014   Chronic pain syndrome 02/13/2020   Class 2 severe obesity due to excess calories with serious comorbidity and body mass index (BMI) of 35.0 to 35.9 in adult [facility]) 12/10/2020   Depression    Dislocation of distal interphalangeal (DIP) joint of finger 05/06/2014   Encounter for immunization 12/05/2020   Erectile dysfunction due to diseases classified elsewhere 04/06/2020   Essential hypertension 11/27/2015   History of left below knee amputation (HCC) 05/16/2019   Hypertension    Impaired mobility and ADLs 05/16/2019   Ischemia of extremity 09/21/2018   Ischemia of left lower extremity 04/28/2019   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 Added automatically from request for surgery [id number]   Other long term (current) drug therapy 12/05/2020   PAD (peripheral artery disease) (HCC) 04/28/2019   Phantom limb syndrome with pain (HCC) 09/12/2019   Formatting of this note might be different from the original. 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   Tobacco use disorder 12/10/2020   Weak urinary stream 12/10/2020    Past Surgical History:  Procedure Laterality Date   ABDOMINAL AORTOGRAM W/LOWER EXTREMITY N/A 05/08/2019   Procedure: ABDOMINAL AORTOGRAM W/LOWER EXTREMITY;  Surgeon: Maeola Harman, MD;  Location: [facility] INVASIVE CV LAB;  Service: Cardiovascular;  Laterality: N/A;   ABDOMINAL AORTOGRAM W/LOWER EXTREMITY N/A 12/23/2020   Procedure: ABDOMINAL AORTOGRAM W/LOWER EXTREMITY;  Surgeon: Maeola Harman, MD;  Location: [facility] INVASIVE CV LAB;  Service: Cardiovascular;  Laterality: N/A;   AMPUTATION Left 01/30/2020   Procedure: LEFT ABOVE KNEE AMPUTATION;  Surgeon: Chuck Hint, MD;  Location: [facility] OR;  Service: Vascular;  Laterality: Left;   ANGIOPLASTY Left 04/27/2019   Procedure: Balloon Angioplasty of left posterior tibial artery;  Surgeon: Maeola Harman, MD;  Location: [facility] OR;  Service: Vascular;  Laterality: Left;   EMBOLECTOMY Left 09/21/2018   Procedure: THROMBECTOMY LEFT POPLITEAL ARTERY BYPASS LEFT POPLITEAL TO TIBIAL ARTERY WITH SAPHENOUS VEIN HARVEST - FASCIOTOMY;  Surgeon: Chuck Hint, MD;  Location: [facility] OR;  Service: Vascular;  Laterality: Left;   FEMORAL-POPLITEAL BYPASS GRAFT Right 01/23/2021   Procedure: RIGHT LOWER EXTREMITY FEMORAL-POPLITEAL ARTERY BYPASS;  Surgeon: Randie Heinz,  Dennard Schaumann, MD;  Location: [facility] OR;  Service: Vascular;  Laterality: Right;   finger surgery Right    small finger  (2 different surgeries)   LOWER EXTREMITY  ANGIOGRAM Left 04/27/2019   Procedure: LEFT LOWER EXTREMITY ANGIOGRAM;  Surgeon: Maeola Harman, MD;  Location: [facility] OR;  Service: Vascular;  Laterality: Left;   PERIPHERAL VASCULAR BALLOON ANGIOPLASTY Left 05/08/2019   Procedure: PERIPHERAL VASCULAR BALLOON ANGIOPLASTY;  Surgeon: Maeola Harman, MD;  Location: [facility] INVASIVE CV LAB;  Service: Cardiovascular;  Laterality: Left;  Posterior tibial   PERIPHERAL VASCULAR INTERVENTION Left 04/28/2019   Procedure: PERIPHERAL VASCULAR INTERVENTION;  Surgeon: Cephus Shelling, MD;  Location: [facility] INVASIVE CV LAB;  Service: Cardiovascular;  Laterality: Left;   PERIPHERAL VASCULAR THROMBECTOMY Bilateral 04/28/2019   Procedure: PERIPHERAL VASCULAR THROMBECTOMY;  Surgeon: Cephus Shelling, MD;  Location: [facility] INVASIVE CV LAB;  Service: Cardiovascular;  Laterality: Bilateral;   PERIPHERAL VASCULAR THROMBECTOMY Left 05/08/2019   Procedure: PERIPHERAL VASCULAR THROMBECTOMY;  Surgeon: Maeola Harman, MD;  Location: [facility] INVASIVE CV LAB;  Service: Cardiovascular;  Laterality: Left;  posterior tibial   TRANSMETATARSAL AMPUTATION Left 05/09/2019   Procedure: Left Below Knee Amputation;  Surgeon: Maeola Harman, MD;  Location: [facility] OR;  Service: Vascular;  Laterality: Left;    Family History  Problem Relation Age of Onset   Diabetes Father     Social History   Socioeconomic History   Marital status: Married    Spouse name: Not on file   Number of children: Not on file   Years of education: Not on file   Highest education level: Not on file  Occupational History   Not on file  Tobacco Use   Smoking status: Every Day    Packs/day: 1.00    Pack years: 0.00    Types: Cigarettes   Smokeless tobacco: Never  Vaping Use   Vaping Use: Never used  Substance and Sexual Activity   Alcohol use: Never   Drug use: Never   Sexual activity: Not on file  Other Topics Concern   Not on file  Social History Narrative   Not on file    Social Determinants of Health   Financial Resource Strain: Not on file  Food Insecurity: Not on file  Transportation Needs: Not on file  Physical Activity: Not on file  Stress: Not on file  Social Connections: Not on file  Intimate Partner Violence: Not on file    No Known Allergies  No current facility-administered medications for this encounter.   Current Outpatient Medications  Medication Sig Dispense Refill   Adalimumab (HUMIRA PEN) 40 MG/0.4ML PNKT Inject 40 mg into the skin every 14 (fourteen) days.     amLODipine (NORVASC) 10 MG tablet Take 10 mg by mouth in the morning.     aspirin EC 81 MG tablet Take 81 mg by mouth in the morning.     Buprenorphine HCl (BELBUCA) 300 MCG FILM Place 300 mcg inside cheek in the morning and at bedtime.     clobetasol ointment (TEMOVATE) 0.05 % Apply 1 application topically 2 (two) times daily as needed (skin irritation).     clopidogrel (PLAVIX) 75 MG tablet Take 1 tablet (75 mg total) by mouth daily with breakfast. 30 tablet 2   cyanocobalamin (,VITAMIN B-12,) 1000 MCG/ML injection Inject 1,000 mcg into the muscle once a week.     DULoxetine (CYMBALTA) 30 MG capsule Take 30 mg by mouth in the morning.     hydrALAZINE (APRESOLINE)  10 MG tablet Take 10 mg by mouth 2 (two) times daily.     olmesartan-hydrochlorothiazide (BENICAR HCT) 40-25 MG tablet Take 1 tablet by mouth daily.     pregabalin (LYRICA) 300 MG capsule Take 300 mg by mouth daily.     rosuvastatin (CRESTOR) 20 MG tablet Take 1 tablet (20 mg total) by mouth daily. 90 tablet 1   Testosterone 20.25 MG/ACT (1.62%) GEL Apply 40.5 mg topically daily. Rub on shoulder     vitamin B-12 (CYANOCOBALAMIN) 250 MCG tablet Take 250 mcg by mouth daily.     Vitamin D, Ergocalciferol, (DRISDOL) 1.25 MG (50000 UNIT) CAPS capsule Take 50,000 Units by mouth every Thursday.     zinc gluconate 50 MG tablet Take 50 mg by mouth daily.      REVIEW OF SYSTEMS:  [X] denotes positive finding, [ ] denotes  negative finding Cardiac  Comments:  Chest pain or chest pressure:    Shortness of breath upon exertion:    Short of breath when lying flat:    Irregular heart rhythm:        Vascular    Pain in calf, thigh, or hip brought on by ambulation:    Pain in feet at night that wakes you up from your sleep:     Blood clot in your veins:    Leg swelling:         Pulmonary    Oxygen at home:    Productive cough:     Wheezing:         Neurologic    Sudden weakness in arms or legs:     Sudden numbness in arms or legs:     Sudden onset of difficulty speaking or slurred speech:    Temporary loss of vision in one eye:     Problems with dizziness:         Gastrointestinal    Blood in stool:     Vomited blood:         Genitourinary    Burning when urinating:     Blood in urine:        Psychiatric    Major depression:         Hematologic    Bleeding problems:    Problems with blood clotting too easily:        Skin    Rashes or ulcers:        Constitutional    Fever or chills:      PHYSICAL EXAM Vitals:   02/26/21 0800 02/26/21 0830 02/26/21 0900 02/26/21 0930  BP: (!) 125/93 (!) 129/91 114/89 114/78  Pulse: 60 63 62 66  Resp: 16 13 14 13  Temp: 98 F (36.7 C)     TempSrc:      SpO2: 100% 100% 100% 100%  Weight:      Height:        Constitutional: well appearing. no distress. Appears well nourished.  Neurologic: CN intact. no focal findings. no sensory loss. Psychiatric:  Mood and affect symmetric and appropriate. Eyes:  No icterus. No conjunctival pallor. Ears, nose, throat:  mucous membranes moist. Midline trachea.  Cardiac: regular rate and rhythm.  Respiratory:  unlabored. Abdominal:  soft, non-tender, non-distended.  Peripheral vascular: Right bypass incisions in thigh are well healed. Superficial dehiscence of the right medial calf wound which appears similar to previous images. Doppler flow in the AT and PT. Normal motor and sensory function of the right foot.  Left above knee amputation. Extremity: 2+   edema from right foot to thigh. no cyanosis. no pallor.  Skin: no gangrene. no ulceration.  Lymphatic: no Stemmer's sign. no palpable lymphadenopathy.  PERTINENT LABORATORY AND RADIOLOGIC DATA  Most recent CBC CBC Latest Ref Rng & Units 02/26/2021 01/24/2021 01/23/2021  WBC 4.0 - 10.5 K/uL 8.9 12.5(H) 14.5(H)  Hemoglobin 13.0 - 17.0 g/dL 10.2 12.8(L) 13.5  Hematocrit 39.0 - 52.0 % 43.8 38.4(L) 41.5  Platelets 150 - 400 K/uL 244 276 276     Most recent CMP CMP Latest Ref Rng & Units 02/26/2021 01/24/2021 01/23/2021  Glucose 70 - 99 mg/dL 585(I) 778(E) -  BUN 6 - 20 mg/dL 7 9 -  Creatinine 4.23 - 1.24 mg/dL 5.36 1.44 3.15  Sodium 135 - 145 mmol/L 141 139 -  Potassium 3.5 - 5.1 mmol/L 3.4(L) 3.7 -  Chloride 98 - 111 mmol/L 105 105 -  CO2 22 - 32 mmol/L 29 24 -  Calcium 8.9 - 10.3 mg/dL 9.3 8.9 -  Total Protein 6.5 - 8.1 g/dL 6.6 - -  Total Bilirubin 0.3 - 1.2 mg/dL 0.9 - -  Alkaline Phos 38 - 126 U/L 68 - -  AST 15 - 41 U/L 24 - -  ALT 0 - 44 U/L 26 - -    Renal function Estimated Creatinine Clearance: 162.4 mL/min (by C-G formula based on SCr of 0.87 mg/dL).  Hgb A1c MFr Bld (%)  Date Value  02/01/2020 6.5 (H)    LDL Cholesterol  Date Value Ref Range Status  02/01/2020 105 (H) 0 - 99 mg/dL Final    Comment:           Total Cholesterol/HDL:CHD Risk Coronary Heart Disease Risk Table                     Men   Women  1/2 Average Risk   3.4   3.3  Average Risk       5.0   4.4  2 X Average Risk   9.6   7.1  3 X Average Risk  23.4   11.0        Use the calculated Patient Ratio above and the CHD Risk Table to determine the patient's CHD Risk.        ATP III CLASSIFICATION (LDL):  <100     mg/dL   Optimal  400-867  mg/dL   Near or Above                    Optimal  130-159  mg/dL   Borderline  619-509  mg/dL   High  >326     mg/dL   Very High Performed at [facility], [address] [place], [place] [address]     Rande Brunt.  Lenell Antu, MD [facility] Phone Number: [phone] 02/26/2021 10:03 AM

## 2021-02-26 NOTE — Transfer of Care (Signed)
Immediate Anesthesia Transfer of Care Note  Patient: Sean [name]  Procedure(s) Performed: RIGHT LOWER EXTREMITY  THROMBECTOMY (Right)  Patient Location: PACU  Anesthesia Type:General  Level of Consciousness: awake and alert   Airway & Oxygen Therapy: Patient Spontanous Breathing  Post-op Assessment: Report given to RN and Post -op Vital signs reviewed and stable  Post vital signs: Reviewed and stable  Last Vitals:  Vitals Value Taken Time  BP    Temp    Pulse    Resp    SpO2      Last Pain:  Vitals:   02/26/21 1329  TempSrc: Oral  PainSc:          Complications: No notable events documented.

## 2021-02-26 NOTE — Progress Notes (Signed)
Patient seen in the PACU following thrombectomy.  He continues to have a good posterior tibial Doppler signal.  I discussed that we would plan for angiography in the immediate future to try and determine the etiology of the bypass graft occlusion.  Unfortunately I was unable to contact his wife as my call went straight to voicemail.  She was not at the volunteer desk either.  He will be n.p.o. after midnight for possible angiography tomorrow.  Durene Cal

## 2021-02-26 NOTE — Op Note (Signed)
    Patient name: Sean [name] MRN: [id number] DOB: 01-Jun-1971 Sex: male  02/26/2021 Pre-operative Diagnosis: Bypass graft occlusion, left leg Post-operative diagnosis:  Same Surgeon:  Durene Cal Assistants:  Margot Ables, PA Procedure:   #1: Thrombectomy of left popliteal to tibioperoneal trunk bypass   #2: Redo exposure of left popliteal tibial bypass Anesthesia: General Blood Loss: 1 L Specimens: None  Findings: Dense scar tissue was encountered making exposure of the distal anastomosis difficult.  The patient also had diffuse oozing from all raw surface areas.  Ultimately, after exposing the bypass graft I perform simple thrombectomy and was able to reestablish excellent flow.  Indications: This is a 50 year old gentleman who has recently undergone a popliteal to tibioperoneal trunk bypass graft with vein.  He has been having pain for several days.  Duplex showed the bypass graft was occluded.  We discussed proceeding with thrombectomy.  Procedure:  The patient was identified in the holding area and taken to [facility] OR ROOM 10  The patient was then placed supine on the table. general anesthesia was administered.  The patient was prepped and draped in the usual sterile fashion.  A time out was called and antibiotics were administered.  A PA was necessary to expedite the procedure and assist with technical details.  The patient's previous below-knee incision was opened with a 10 blade.  Immediately upon incision, the scar tissue was very dense.  There were no obvious tissue planes.  I therefore used blunt dissection to get down to the bypass graft which was heavily scarred and.  I attempted to dissect out the distal anastomosis however with the amount of oozing making visualization limited as well as the dense scar tissue, I felt like this would be very problematic.  I was able to expose the vein graft which was clearly thrombosed.  The patient was fully heparinized.  The vein graft was then opened  transversely with an 11 blade.  I then passed a Fogarty catheter proximally.  This did get hung up with retained valves.  Ultimately I was able to navigate through the bypass graft in the proximal anastomosis and performed thrombectomy.  I used a #4 #5 Fogarty catheter and passed this multiple times until all thrombus was evacuated and there was excellent bleeding.  The proximal graft was flushed with heparin saline and reoccluded.  I then passed a #3 Fogarty across the distal anastomosis.  This was done multiple times until all thrombus was evacuated and there was good backbleeding.  I then closed the venotomy and the vein graft with running 6-0 Prolene.  The clamps were released.  The patient had a brisk palpable pulse within the graft and a multiphasic posterior tibial signal.  At this point since we were not in a room with fluoroscopy, I elected to stop.  I spent approximately 45 minutes trying to get hemostasis which was difficult to achieve.  I ultimately placed a 15 Blake drain.  I closed the wound with deep bites of 2-0 nylon vertical mattress sutures followed by staples.  Sterile dressings were applied.  He was taken to recovery in stable condition.  Plan will be for angiography to try and determine the etiology of the bypass graft failure tomorrow.     Disposition: To PACU stable   V. Durene Cal, M.D., [facility] [place] Office: [phone] Pager:  [phone]

## 2021-02-26 NOTE — Anesthesia Procedure Notes (Signed)
Procedure Name: Intubation Date/Time: 02/26/2021 2:09 PM Performed by: Rosiland Oz, CRNA Pre-anesthesia Checklist: Patient identified, Emergency Drugs available, Suction available, Patient being monitored and Timeout performed Patient Re-evaluated:Patient Re-evaluated prior to induction Oxygen Delivery Method: Circle system utilized Preoxygenation: Pre-oxygenation with 100% oxygen Induction Type: IV induction, Rapid sequence and Cricoid Pressure applied Laryngoscope Size: Miller and 3 Grade View: Grade I Tube type: Oral Tube size: 7.5 mm Number of attempts: 1 Airway Equipment and Method: Stylet Placement Confirmation: ETT inserted through vocal cords under direct vision, positive ETCO2 and breath sounds checked- equal and bilateral Secured at: 23 cm Tube secured with: Tape Dental Injury: Teeth and Oropharynx as per pre-operative assessment

## 2021-02-27 ENCOUNTER — Encounter (HOSPITAL_COMMUNITY)
Admission: EM | Disposition: A | Discharge status: Home / Self Care | Payer: Self-pay | Source: Home / Self Care | Attending: Surgery

## 2021-02-27 ENCOUNTER — Encounter (HOSPITAL_COMMUNITY): Payer: Self-pay | Admitting: Surgery

## 2021-02-27 LAB — LIPID PANEL
Cholesterol: 77 mg/dL (ref 0–200)
HDL: 26 mg/dL — ABNORMAL LOW (ref >40)
LDL Cholesterol: 35 mg/dL (ref 0–99)
Total CHOL/HDL Ratio: 3 RATIO
Triglycerides: 78 mg/dL (ref <150)
VLDL: 16 mg/dL (ref 0–40)

## 2021-02-27 LAB — BASIC METABOLIC PANEL
Anion gap: 8 (ref 5–15)
BUN: 9 mg/dL (ref 6–20)
CO2: 23 mmol/L (ref 22–32)
Calcium: 8.6 mg/dL — ABNORMAL LOW (ref 8.9–10.3)
Chloride: 107 mmol/L (ref 98–111)
Creatinine, Ser: 1.14 mg/dL (ref 0.61–1.24)
GFR, Estimated: >60 mL/min (ref >60)
Glucose, Bld: 226 mg/dL — ABNORMAL HIGH (ref 70–99)
Potassium: 3.7 mmol/L (ref 3.5–5.1)
Sodium: 138 mmol/L (ref 135–145)

## 2021-02-27 LAB — CBC
HCT: 28.8 % — ABNORMAL LOW (ref 39.0–52.0)
Hemoglobin: 9.5 g/dL — ABNORMAL LOW (ref 13.0–17.0)
MCH: 26.8 pg (ref 26.0–34.0)
MCHC: 33 g/dL (ref 30.0–36.0)
MCV: 81.4 fL (ref 80.0–100.0)
Platelets: 217 10*3/uL (ref 150–400)
RBC: 3.54 MIL/uL — ABNORMAL LOW (ref 4.22–5.81)
RDW: 15 % (ref 11.5–15.5)
WBC: 12.8 10*3/uL — ABNORMAL HIGH (ref 4.0–10.5)
nRBC: 0 % (ref 0.0–0.2)

## 2021-02-27 SURGERY — ABDOMINAL AORTOGRAM W/LOWER EXTREMITY
Anesthesia: LOCAL

## 2021-02-27 NOTE — Progress Notes (Addendum)
  Progress Note    02/27/2021 7:38 AM 1 Day Post-Op  Subjective:  R foot feels better than before surgery with return of sensation   Vitals:   02/26/21 2308 02/27/21 0305  BP: 113/75 104/65  Pulse: 100 98  Resp: 19 20  Temp: 98.2 F (36.8 C) 98.1 F (36.7 C)  SpO2: 100% 99%   Physical Exam: Lungs:  non labored Incisions:  dressing left in place but no breakthrough bleeding Extremities:  brisk DP and PT by doppler Neurologic: A&O  CBC    Component Value Date/Time   WBC 12.8 (H) 02/27/2021 0143   RBC 3.54 (L) 02/27/2021 0143   HGB 9.5 (L) 02/27/2021 0143   HCT 28.8 (L) 02/27/2021 0143   PLT 217 02/27/2021 0143   MCV 81.4 02/27/2021 0143   MCH 26.8 02/27/2021 0143   MCHC 33.0 02/27/2021 0143   RDW 15.0 02/27/2021 0143   LYMPHSABS 4.4 (H) 02/26/2021 0630   MONOABS 0.6 02/26/2021 0630   EOSABS 0.4 02/26/2021 0630   BASOSABS 0.0 02/26/2021 0630    BMET    Component Value Date/Time   NA 138 02/27/2021 0143   K 3.7 02/27/2021 0143   CL 107 02/27/2021 0143   CO2 23 02/27/2021 0143   GLUCOSE 226 (H) 02/27/2021 0143   BUN 9 02/27/2021 0143   CREATININE 1.14 02/27/2021 0143   CALCIUM 8.6 (L) 02/27/2021 0143   GFRNONAA >60 02/27/2021 0143   GFRAA >60 02/13/2020 0058    INR    Component Value Date/Time   INR 1.0 02/26/2021 1200     Intake/Output Summary (Last 24 hours) at 02/27/2021 0738 Last data filed at 02/27/2021 0600 Gross per 24 hour  Intake 4283.87 ml  Output 1985 ml  Net 2298.87 ml     Assessment/Plan:  50 y.o. male is s/p thrombectomy of RLE bypass 1 Day Post-Op   R foot warm with motor and sensation intact; brisk DP and PT doppler signal Edema right lower leg but calf not firm or painful; continue to monitor for compartment syndrome Continue JP drain Keep NPO for possible arteriogram of bypass today   Emilie Rutter, PA-C [facility] [phone] 02/27/2021 7:38 AM   I agree with the above.  I have seen and examined the  patient. He has a PT signal and his foot feels better.  We discussed proceeding with angiogram to determine the etiology of his BPG occlusion, since scar tissue prevented adequate exposure and exploration of the distal anastamosis  Sean [name]

## 2021-02-27 NOTE — Progress Notes (Signed)
Obtained consent form and placed it in pt's chart.   Aquila Menzie S Leeanna Slaby, RN  

## 2021-02-27 NOTE — Evaluation (Signed)
Physical Therapy Evaluation Patient Details Name: Sean [name] MRN: [id number] DOB: [birth year] Today's Date: 02/27/2021   History of Present Illness  pt is a 50 y/o male admitted with pain and tingling Rt foot.  He was found to have bypass graft occlusion Rt LE.  He underwent thrombectomy Lt Popliteal to tibioperoneal trunk bypass.  PMH includes:  s/p Lt AKA, arthritis, CTS, chronic pain syndrome, depression, PAD, PVD,  Clinical Impression  Pt admitted with/for R LE pain and tingling due to ischemia, s/p thrombectomy L popliteal to tibioperoneal trunk bypass.  Pt needing moderate assist for transfers due to pain R LE, AK prosthesis L LE and his height.   Pt currently limited functionally due to the problems listed below.  (see problems list.)  Pt will benefit from PT to maximize function and safety to be able to get home safely with available assist.     Follow Up Recommendations No PT follow up;Supervision/Assistance - 24 hour    Equipment Recommendations  None recommended by PT    Recommendations for Other Services       Precautions / Restrictions Precautions Precautions: Fall Precaution Comments: Rt AKA - has prosthesis Restrictions Weight Bearing Restrictions: No      Mobility  Bed Mobility Overal bed mobility: Needs Assistance Bed Mobility: Supine to Sit;Sit to Supine     Supine to sit: Supervision Sit to supine: Supervision        Transfers Overall transfer level: Needs assistance Equipment used: Rolling walker (2 wheeled) Transfers: Stand Pivot Transfers;Sit to/from Stand Sit to Stand: Mod assist;+2 physical assistance;+2 safety/equipment;From elevated surface Stand pivot transfers: Min guard       General transfer comment: Pt with difficulty moving into standing position due to Rt knee pain and required bed to be elevated significantly due to his 6'5" height, then mod A +2  Ambulation/Gait Ambulation/Gait assistance: Min guard Gait Distance (Feet): 90  Feet Assistive device: Rolling walker (2 wheeled) Gait Pattern/deviations: Step-through pattern;Decreased stride length   Gait velocity interpretation: 1.31 - 2.62 ft/sec, indicative of limited community ambulator General Gait Details: generally steady step through pattern , moderate use of the RW, mildly antalgic due to a generous amount of pain at incision.  Stairs            Wheelchair Mobility    Modified Rankin (Stroke Patients Only)       Balance Overall balance assessment: Mild deficits observed, not formally tested                                           Pertinent Vitals/Pain Pain Assessment: 0-10 Pain Score: 8  Pain Location: Rt LE Pain Descriptors / Indicators: Operative site guarding Pain Intervention(s): Monitored during session;Patient requesting pain meds-RN notified    Home Living Family/patient expects to be discharged to:: Private residence Living Arrangements: Spouse/significant other;Children Available Help at Discharge: Available 24 hours/day Type of Home: House Home Access: Stairs to enter Entrance Stairs-Rails: Left;Right;Can reach both Entrance Stairs-Number of Steps: 3+1 Home Layout: Two level;Able to live on main level with bedroom/bathroom;1/2 bath on main level;Bed/bath upstairs Home Equipment: Cane - single point;Tub bench;Walker - 2 wheels;Toilet riser;Other (comment) (lift chair) Additional Comments: can sleep on first floor in recliner    Prior Function Level of Independence: Needs assistance   Gait / Transfers Assistance Needed: typically ambulates without AD, but has been using SPC for past several days  due to Rt LE pain  ADL's / Homemaking Assistance Needed: has required assist for sock Rt LE        Hand Dominance   Dominant Hand: Right    Extremity/Trunk Assessment   Upper Extremity Assessment Upper Extremity Assessment: Overall WFL for tasks assessed    Lower Extremity Assessment Lower Extremity  Assessment: Overall WFL for tasks assessed;RLE deficits/detail;LLE deficits/detail RLE Deficits / Details: painful and therefore weaker and less able to boost his weight. RLE: Unable to fully assess due to pain LLE Deficits / Details: manages AK prosthesis well    Cervical / Trunk Assessment Cervical / Trunk Assessment: Normal  Communication   Communication: No difficulties  Cognition Arousal/Alertness: Awake/alert Behavior During Therapy: WFL for tasks assessed/performed Overall Cognitive Status: Within Functional Limits for tasks assessed                                        General Comments General comments (skin integrity, edema, etc.): HR witness at 110's    Exercises     Assessment/Plan    PT Assessment Patient needs continued PT services  PT Problem List Decreased activity tolerance;Decreased mobility;Decreased knowledge of use of DME;Pain       PT Treatment Interventions DME instruction;Gait training;Stair training;Functional mobility training;Therapeutic activities;Patient/family education    PT Goals (Current goals can be found in the Care Plan section)  Acute Rehab PT Goals Patient Stated Goal: Independent PT Goal Formulation: With patient Time For Goal Achievement: 03/06/21 Potential to Achieve Goals: Good    Frequency Min 4X/week   Barriers to discharge        Co-evaluation PT/OT/SLP Co-Evaluation/Treatment: Yes Reason for Co-Treatment: For patient/therapist safety PT goals addressed during session: Mobility/safety with mobility         AM-PAC PT "6 Clicks" Mobility  Outcome Measure Help needed turning from your back to your side while in a flat bed without using bedrails?: None Help needed moving from lying on your back to sitting on the side of a flat bed without using bedrails?: None Help needed moving to and from a bed to a chair (including a wheelchair)?: A Lot Help needed standing up from a chair using your arms (e.g.,  wheelchair or bedside chair)?: A Lot Help needed to walk in hospital room?: A Little Help needed climbing 3-5 steps with a railing? : A Little 6 Click Score: 18    End of Session Equipment Utilized During Treatment: Gait belt Activity Tolerance: Patient tolerated treatment well;Patient limited by pain Patient left: in bed;with call bell/phone within reach Nurse Communication: Mobility status PT Visit Diagnosis: Other abnormalities of gait and mobility (R26.89);Pain Pain - Right/Left: Right Pain - part of body: Leg    Time: 3428-7681 PT Time Calculation (min) (ACUTE ONLY): 15 min   Charges:   PT Evaluation $PT Eval Low Complexity: 1 Low          02/27/2021  Jacinto Halim., PT Acute Rehabilitation Services [phone]  (pager) [phone]  (office)  Sean [name] 02/27/2021, 11:24 AM

## 2021-02-27 NOTE — Evaluation (Signed)
Occupational Therapy Evaluation Patient Details Name: Sean [name] MRN: [id number] DOB: [birth year] Today's Date: 02/27/2021    History of Present Illness pt is a 50 y/o male admitted with pain and tingling Rt foot.  He was found to have bypass graft occlusion Rt LE.  He underwent thrombectomy Lt Popliteal to tibioperoneal trunk bypass.  PMH includes:  s/p Lt AKA, arthritis, CTS, chronic pain syndrome, depression, PAD, PVD,   Clinical Impression   Pt admitted with above. He demonstrates the below listed deficits and will benefit from continued OT to maximize safety and independence with BADLs.  Pt presents to OT with pain Rt LE, mild balance impairments, decreased activity tolerance.  Pt currently requires mod A for LB ADLs and set up assist for UB ADLs.  He required mod A +2 to stand from elevated surface due to Rt LE pain, but then was able to ambulate with min guard assist.  He reports he lives with his spouse and was mod I with ADLs PTA.  He was sleeping in his lift chair just prior to admission due to pain, and was using [facility] for ambulation.  Anticipate good progress.      Follow Up Recommendations  No OT follow up;Supervision - Intermittent    Equipment Recommendations  None recommended by OT    Recommendations for Other Services       Precautions / Restrictions Precautions Precautions: Fall Precaution Comments: Rt AKA - has prosthesis Restrictions Weight Bearing Restrictions: No      Mobility Bed Mobility Overal bed mobility: Needs Assistance Bed Mobility: Supine to Sit;Sit to Supine     Supine to sit: Supervision Sit to supine: Supervision        Transfers Overall transfer level: Needs assistance Equipment used: Rolling walker (2 wheeled) Transfers: Stand Pivot Transfers;Sit to/from Stand Sit to Stand: Mod assist;+2 physical assistance;+2 safety/equipment;From elevated surface Stand pivot transfers: Min guard       General transfer comment: Pt with difficulty  moving into standing position due to Rt knee pain and required bed to be elevated significantly due to his 6'5" height, then mod A +2    Balance Overall balance assessment: Mild deficits observed, not formally tested                                         ADL either performed or assessed with clinical judgement   ADL Overall ADL's : Needs assistance/impaired Eating/Feeding: Independent   Grooming: Wash/dry hands;Wash/dry face;Oral care;Set up;Sitting   Upper Body Bathing: Set up;Sitting   Lower Body Bathing: Moderate assistance;Sit to/from stand   Upper Body Dressing : Set up;Sitting   Lower Body Dressing: Moderate assistance;Sit to/from stand Lower Body Dressing Details (indicate cue type and reason): able to don/doff Lt prosthesis with min A, assist for sock Rt LE... able to reach far enough down to adjust sock and should be able to doff pants over Rt foot.  Min A to pull pants over hips and mod A +2 to stand from elevated bed Toilet Transfer: Moderate assistance;+2 for physical assistance;+2 for safety/equipment;BSC;RW;Ambulation   Toileting- Clothing Manipulation and Hygiene: Moderate assistance       Functional mobility during ADLs: Moderate assistance;+2 for physical assistance;+2 for safety/equipment;Rolling walker       Vision Patient Visual Report: No change from baseline       Perception     Praxis  Pertinent Vitals/Pain Pain Assessment: 0-10 Pain Score: 8  Pain Location: Rt LE Pain Descriptors / Indicators: Operative site guarding Pain Intervention(s): Monitored during session;Patient requesting pain meds-RN notified     Hand Dominance Right   Extremity/Trunk Assessment Upper Extremity Assessment Upper Extremity Assessment: Overall WFL for tasks assessed   Lower Extremity Assessment Lower Extremity Assessment: Overall WFL for tasks assessed;RLE deficits/detail;LLE deficits/detail RLE Deficits / Details: painful and therefore  weaker and less able to boost his weight. RLE: Unable to fully assess due to pain LLE Deficits / Details: manages AK prosthesis well   Cervical / Trunk Assessment Cervical / Trunk Assessment: Normal   Communication Communication Communication: No difficulties   Cognition Arousal/Alertness: Awake/alert Behavior During Therapy: WFL for tasks assessed/performed Overall Cognitive Status: Within Functional Limits for tasks assessed                                     General Comments  HR witness at 110's    Exercises     Shoulder Instructions      Home Living Family/patient expects to be discharged to:: Private residence Living Arrangements: Spouse/significant other;Children Available Help at Discharge: Available 24 hours/day Type of Home: House Home Access: Stairs to enter [employer] of Steps: 3+1 Entrance Stairs-Rails: Left;Right;Can reach both Home Layout: Two level;Able to live on main level with bedroom/bathroom;1/2 bath on main level;Bed/bath upstairs Alternate Level Stairs-Number of Steps: 12-14 Alternate Level Stairs-Rails: Can reach both;Left;Right Bathroom Shower/Tub: Walk-in shower;Tub/shower unit   Bathroom Toilet: Handicapped height Bathroom Accessibility: Yes How Accessible: Accessible via walker Home Equipment: Cane - single point;Tub bench;Walker - 2 wheels;Toilet riser;Other (comment) (lift chair)   Additional Comments: can sleep on first floor in recliner      Prior Functioning/Environment Level of Independence: Needs assistance  Gait / Transfers Assistance Needed: typically ambulates without AD, but has been using SPC for past several days due to Rt LE pain ADL's / Homemaking Assistance Needed: has required assist for sock Rt LE            OT Problem List: Decreased strength;Decreased activity tolerance;Impaired balance (sitting and/or standing);Pain      OT Treatment/Interventions: Self-care/ADL training;DME and/or AE  instruction;Therapeutic activities;Patient/family education;Balance training    OT Goals(Current goals can be found in the care plan section) Acute Rehab OT Goals Patient Stated Goal: Independent OT Goal Formulation: With patient Time For Goal Achievement: 03/13/21 Potential to Achieve Goals: Good ADL Goals Pt Will Perform Grooming: with supervision;standing Pt Will Perform Lower Body Bathing: with min guard assist;sit to/from stand;with adaptive equipment Pt Will Perform Lower Body Dressing: with min guard assist;sit to/from stand;sitting/lateral leans;with adaptive equipment Pt Will Transfer to Toilet: with min guard assist;ambulating;regular height toilet;bedside commode;grab bars Pt Will Perform Toileting - Clothing Manipulation and hygiene: with min guard assist;sit to/from stand Pt Will Perform Tub/Shower Transfer: Tub transfer;ambulating;tub bench;grab bars;rolling walker  OT Frequency: Min 2X/week   Barriers to D/C:            Co-evaluation PT/OT/SLP Co-Evaluation/Treatment: Yes Reason for Co-Treatment: For patient/therapist safety PT goals addressed during session: Mobility/safety with mobility        AM-PAC OT "6 Clicks" Daily Activity     Outcome Measure Help from another person eating meals?: None Help from another person taking care of personal grooming?: A Little Help from another person toileting, which includes using toliet, bedpan, or urinal?: A Lot Help from another person bathing (  including washing, rinsing, drying)?: A Lot Help from another person to put on and taking off regular upper body clothing?: A Little Help from another person to put on and taking off regular lower body clothing?: A Lot 6 Click Score: 16   End of Session Equipment Utilized During Treatment: Rolling walker;Gait belt;Other (comment) (prosthesis) Nurse Communication: Mobility status;Patient requests pain meds  Activity Tolerance: Patient limited by pain Patient left: in bed;with call  bell/phone within reach;with bed alarm set (EOB)  OT Visit Diagnosis: Unsteadiness on feet (R26.81);Pain Pain - Right/Left: Right Pain - part of body: Leg;Knee                Time: 9323-5573 OT Time Calculation (min): 34 min Charges:  OT General Charges $OT Visit: 1 Visit OT Evaluation $OT Eval Moderate Complexity: 1 Mod OT Treatments $Self Care/Home Management : 8-22 mins  Eber Jones., OTR/L Acute Rehabilitation Services Pager [phone] Office [phone]   Jeani Hawking M 02/27/2021, 11:17 AM

## 2021-02-27 NOTE — Progress Notes (Signed)
Pt's dressing had scant amount of bloody drainage after ambulating with PT. New dressing applied with xeroform, ABD, and kerlix per patient requested. Will continue to monitor the pt.  Lawson Radar, RN

## 2021-02-28 ENCOUNTER — Encounter (HOSPITAL_COMMUNITY)
Admission: EM | Disposition: A | Discharge status: Home / Self Care | Payer: Self-pay | Source: Home / Self Care | Attending: Surgery

## 2021-02-28 DIAGNOSIS — M79604 Pain in right leg: Secondary | ICD-10-CM

## 2021-02-28 DIAGNOSIS — M7989 Other specified soft tissue disorders: Secondary | ICD-10-CM

## 2021-02-28 DIAGNOSIS — I70291 Other atherosclerosis of native arteries of extremities, right leg: Secondary | ICD-10-CM

## 2021-02-28 HISTORY — PX: PERIPHERAL VASCULAR BALLOON ANGIOPLASTY: CATH118281

## 2021-02-28 HISTORY — PX: ABDOMINAL AORTOGRAM W/LOWER EXTREMITY: CATH118223

## 2021-02-28 LAB — CBC
HCT: 22.2 % — ABNORMAL LOW (ref 39.0–52.0)
HCT: 23.8 % — ABNORMAL LOW (ref 39.0–52.0)
Hemoglobin: 7.3 g/dL — ABNORMAL LOW (ref 13.0–17.0)
Hemoglobin: 7.7 g/dL — ABNORMAL LOW (ref 13.0–17.0)
MCH: 26.7 pg (ref 26.0–34.0)
MCH: 26.6 pg (ref 26.0–34.0)
MCHC: 32.9 g/dL (ref 30.0–36.0)
MCHC: 32.4 g/dL (ref 30.0–36.0)
MCV: 81.3 fL (ref 80.0–100.0)
MCV: 82.4 fL (ref 80.0–100.0)
Platelets: 164 10*3/uL (ref 150–400)
Platelets: 172 10*3/uL (ref 150–400)
RBC: 2.73 MIL/uL — ABNORMAL LOW (ref 4.22–5.81)
RBC: 2.89 MIL/uL — ABNORMAL LOW (ref 4.22–5.81)
RDW: 15.4 % (ref 11.5–15.5)
RDW: 15.5 % (ref 11.5–15.5)
WBC: 13.7 10*3/uL — ABNORMAL HIGH (ref 4.0–10.5)
WBC: 11.5 10*3/uL — ABNORMAL HIGH (ref 4.0–10.5)
nRBC: 0 % (ref 0.0–0.2)
nRBC: 0 % (ref 0.0–0.2)

## 2021-02-28 LAB — CREATININE, SERUM
Creatinine, Ser: 1.05 mg/dL (ref 0.61–1.24)
GFR, Estimated: >60 mL/min (ref >60)

## 2021-02-28 LAB — POCT ACTIVATED CLOTTING TIME: Activated Clotting Time: 208 seconds

## 2021-02-28 SURGERY — ABDOMINAL AORTOGRAM W/LOWER EXTREMITY
Anesthesia: LOCAL

## 2021-02-28 MED ORDER — SODIUM CHLORIDE 0.9% FLUSH
3.0000 mL | Freq: Two times a day (BID) | INTRAVENOUS | Status: DC
  Administered 2021-03-01 – 2021-03-02 (×4): 3 mL via INTRAVENOUS

## 2021-02-28 MED ORDER — MIDAZOLAM HCL 2 MG/2ML IJ SOLN
INTRAMUSCULAR | Status: DC | PRN
  Administered 2021-02-28: 1 mg via INTRAVENOUS

## 2021-02-28 MED ORDER — FENTANYL CITRATE (PF) 100 MCG/2ML IJ SOLN
INTRAMUSCULAR | Status: AC
  Filled 2021-02-28: qty 2

## 2021-02-28 MED ORDER — HEPARIN SODIUM (PORCINE) 1000 UNIT/ML IJ SOLN
INTRAMUSCULAR | Status: AC
  Filled 2021-02-28: qty 1

## 2021-02-28 MED ORDER — SODIUM CHLORIDE 0.9 % WEIGHT BASED INFUSION: 1.0000 mL/kg/h | INTRAVENOUS | Status: AC

## 2021-02-28 MED ORDER — LIDOCAINE HCL (PF) 1 % IJ SOLN
INTRAMUSCULAR | Status: AC
  Filled 2021-02-28: qty 30

## 2021-02-28 MED ORDER — SODIUM CHLORIDE 0.9 % IV SOLN: 250.0000 mL | INTRAVENOUS | Status: DC | PRN

## 2021-02-28 MED ORDER — SODIUM CHLORIDE 0.9% FLUSH: 3.0000 mL | INTRAVENOUS | Status: DC | PRN

## 2021-02-28 MED ORDER — HEPARIN (PORCINE) IN NACL 1000-0.9 UT/500ML-% IV SOLN
INTRAVENOUS | Status: AC
  Filled 2021-02-28: qty 500

## 2021-02-28 MED ORDER — HEPARIN SODIUM (PORCINE) 5000 UNIT/ML IJ SOLN
5000.0000 [IU] | Freq: Three times a day (TID) | INTRAMUSCULAR | Status: DC
  Administered 2021-02-28 – 2021-03-02 (×5): 5000 [IU] via SUBCUTANEOUS
  Filled 2021-02-28 (×5): qty 1

## 2021-02-28 MED ORDER — IODIXANOL 320 MG/ML IV SOLN
INTRAVENOUS | Status: DC | PRN
  Administered 2021-02-28: 140 mL via INTRA_ARTERIAL

## 2021-02-28 MED ORDER — HEPARIN (PORCINE) IN NACL 1000-0.9 UT/500ML-% IV SOLN
INTRAVENOUS | Status: DC | PRN
  Administered 2021-02-28 (×2): 500 mL

## 2021-02-28 MED ORDER — FENTANYL CITRATE (PF) 100 MCG/2ML IJ SOLN
INTRAMUSCULAR | Status: DC | PRN
  Administered 2021-02-28 (×2): 50 ug via INTRAVENOUS

## 2021-02-28 MED ORDER — HEPARIN SODIUM (PORCINE) 1000 UNIT/ML IJ SOLN
INTRAMUSCULAR | Status: DC | PRN
  Administered 2021-02-28: 10000 [IU] via INTRAVENOUS

## 2021-02-28 MED ORDER — MIDAZOLAM HCL 2 MG/2ML IJ SOLN
INTRAMUSCULAR | Status: AC
  Filled 2021-02-28: qty 2

## 2021-02-28 MED ORDER — SODIUM CHLORIDE 0.9 % IV SOLN: INTRAVENOUS | Status: DC

## 2021-02-28 MED ORDER — LIDOCAINE HCL (PF) 1 % IJ SOLN
INTRAMUSCULAR | Status: DC | PRN
  Administered 2021-02-28: 20 mL

## 2021-02-28 SURGICAL SUPPLY — 23 items
BALLN COYOTE OTW 3.5X60X150 (BALLOONS) ×3
BALLOON COYOTE OTW 3.5X60X150 (BALLOONS) ×2 IMPLANT
CATH ANGIO 5F BER 100CM (CATHETERS) ×3 IMPLANT
CATH CXI SUPP 2.6F 150 ANG (CATHETERS) ×3 IMPLANT
CATH OMNI FLUSH 5F 65CM (CATHETERS) ×3 IMPLANT
COVER DOME SNAP 22 D (MISCELLANEOUS) ×3 IMPLANT
DEVICE CLOSURE MYNXGRIP 5F (Vascular Products) ×3 IMPLANT
DEVICE TORQUE H2O (MISCELLANEOUS) ×3 IMPLANT
GLIDEWIRE ADV .035X260CM (WIRE) ×3 IMPLANT
GUIDEWIRE ANGLED .035X150CM (WIRE) ×3 IMPLANT
KIT MICROPUNCTURE NIT STIFF (SHEATH) ×3 IMPLANT
KIT PV (KITS) ×3 IMPLANT
MAT PREVALON FULL STRYKER (MISCELLANEOUS) ×3 IMPLANT
SHEATH DESTINATION MP 5FR 45CM (SHEATH) ×3 IMPLANT
SHEATH PINNACLE 5F 10CM (SHEATH) ×3 IMPLANT
SHEATH PINNACLE 6F 10CM (SHEATH) IMPLANT
SYR MEDRAD MARK V 150ML (SYRINGE) ×3 IMPLANT
TRANSDUCER W/STOPCOCK (MISCELLANEOUS) ×3 IMPLANT
TRAY PV CATH (CUSTOM PROCEDURE TRAY) ×3 IMPLANT
WIRE BENTSON .035X145CM (WIRE) ×3 IMPLANT
WIRE G V18X300CM (WIRE) ×3 IMPLANT
WIRE SPARTACORE .014X300CM (WIRE) ×3 IMPLANT
WIRE TORQFLEX AUST .018X40CM (WIRE) ×3 IMPLANT

## 2021-02-28 NOTE — Progress Notes (Signed)
PT Cancellation Note  Patient Details Name: Sean [name] MRN: [id number] DOB: [birth year]   Cancelled Treatment:    Reason Eval/Treat Not Completed: Patient at procedure or test/unavailable.  Pt heading to Cath Lab imminently.  Will try to see him later after procedure if possible and appropriate.    Eliseo Gum Bryse Blanchette 02/28/2021, 1:23 PM 02/28/2021  Jacinto Halim., PT Acute Rehabilitation Services [phone]  (pager) [phone]  (office)

## 2021-02-28 NOTE — Op Note (Signed)
DATE OF SERVICE: 02/28/2021  PATIENT:  Sean [name]  50 y.o. male  PRE-OPERATIVE DIAGNOSIS:  Atherosclerosis of native arteries of right lower extremity. Status post thrombectomy of right superficial femoral to tibioperoneal trunk bypass.   POST-OPERATIVE DIAGNOSIS:  Same  PROCEDURE:   1) US guided left common femoral artery access 2) Aortogram 3) right lower extremity angiogram with third order cannulation ( total contrast) 4) Conscious sedation (74 minutes) 5) Angioplasty of right posterior tibial artery and peroneal artery (3.5 x 38mm Coyote)   SURGEON:  Surgeon(s) and Role:    * Bonny Egger, Rande Brunt, MD - Primary  ASSISTANT: none  ANESTHESIA:   local and IV sedation  EBL: min  BLOOD ADMINISTERED:none  DRAINS: none   LOCAL MEDICATIONS USED:  LIDOCAINE   SPECIMEN:  none  COUNTS: confirmed correct.  TOURNIQUET:  none  PATIENT DISPOSITION:  PACU - hemodynamically stable.   Delay start of Pharmacological VTE agent (>24hrs) due to surgical blood loss or risk of bleeding: no  INDICATION FOR PROCEDURE: Sean [name] is a 50 y.o. male with recently thrombosed bypass. He underwent successful thrombectomy. No clear cause could be found for the thrombosis. After careful discussion of risks, benefits, and alternatives the patient was offered angiography. We specifically discussed access site complications. The patient understood and wished to proceed.  OPERATIVE FINDINGS:  No issues with inflow or conduit of R SFA - TP trunk bypass No outflow through peroneal artery Good outflow through PT artery Moderate stenosis (~65%) of proximal PT near anastomosis No response to angioplasty of peroneal Resolution of PT stenosis after angioplasty  DESCRIPTION OF PROCEDURE: After identification of the patient in the pre-operative holding area, the patient was transferred to the operating room. The patient was positioned supine on the operating room table. Anesthesia was induced. The  groins was prepped and draped in standard fashion. A surgical pause was performed confirming correct patient, procedure, and operative location.  The left groin was anesthetized with subcutaneous injection of 1% lidocaine. Using ultrasound guidance, the left common femoral artery was accessed with micropuncture technique. Fluoroscopy was used to confirm cannulation over the femoral head. Sheathogram was not performed. The 1F sheath was upsized to 46F.   An 035 glidewire advantage was advanced into the distal aorta. Over the wire an omni flush catheter was advanced to the level of L2. Aortogram was performed - see above for details.   The right common iliac artery was selected with the 035 glidewire advantage. The wire was advanced into the common femoral artery. Over the wire the omni flush catheter was advanced into the external iliac artery. Selective angiography was performed - see above for details.   The decision was made to intervene. The patient was heparinized with 02725 units of heparin. The sheath was exchanged for a 46F x 45cm sheath. Selective angiography of the left lower extremity was performed prior to intervention. The lesions were treated with 3.5 x 36mm Coyote  Completion angiography revealed:  Resolution of PT juxta-anastomotic stenosis  A mynx device was used to close the arteriotomy. The mynx failed to close the arteriotomy. Manual pressure was held. Hemostasis was excellent after pressure  Conscious sedation was administered with the use of IV fentanyl and midazolam under continuous physician and nurse monitoring.  Heart rate, blood pressure, and oxygen saturation were continuously monitored.  Total sedation time was 74 minutes  Upon completion of the case instrument and sharps counts were confirmed correct. The patient was transferred to the PACU in good condition. I  was present for all portions of the procedure.  PLAN: ASA, Plavix, Statin therapy. Routine postop care.    Rande Brunt. Lenell Antu, MD [facility] Phone Number: [phone] 02/28/2021 3:10 PM

## 2021-02-28 NOTE — Progress Notes (Signed)
[facility] [place] PROGRESS NOTE  ASSESSMENT / PLAN: Sean [name] is a 50 y.o. male status post R SFA - TP trunk bypass thrombectomy 02/26/21. For angiogram today to look for possible technical problems with inflow, bypass, or outflow.   SUBJECTIVE: No interval changes.  OBJECTIVE: BP 108/74   Pulse 79   Temp 97.7 F (36.5 C) (Oral)   Resp 13   Ht 6\' 5"  (1.956 m)   Wt (!) 152 kg   SpO2 100%   BMI 39.74 kg/m   Intake/Output Summary (Last 24 hours) at 02/28/2021 1350 Last data filed at 02/28/2021 1146 Gross per 24 hour  Intake 505.87 ml  Output 820 ml  Net -314.13 ml    NAD RRR Unlabored Incision clean and dry Right foot warm and well perfused.  CBC Latest Ref Rng & Units 02/28/2021 02/27/2021 02/26/2021  WBC 4.0 - 10.5 K/uL 13.7(H) 12.8(H) 13.0(H)  Hemoglobin 13.0 - 17.0 g/dL 7.3(L) 9.5(L) 10.5(L)  Hematocrit 39.0 - 52.0 % 22.2(L) 28.8(L) 32.7(L)  Platelets 150 - 400 K/uL 164 217 228     CMP Latest Ref Rng & Units 02/27/2021 02/26/2021 02/26/2021  Glucose 70 - 99 mg/dL 04/29/2021) - 735(H)  BUN 6 - 20 mg/dL 9 - 7  Creatinine 299(M - 1.24 mg/dL 4.26 8.34 1.96  Sodium 135 - 145 mmol/L 138 - 143  Potassium 3.5 - 5.1 mmol/L 3.7 - 3.4(L)  Chloride 98 - 111 mmol/L 107 - 100  CO2 22 - 32 mmol/L 23 - -  Calcium 8.9 - 10.3 mg/dL 2.22) - -  Total Protein 6.5 - 8.1 g/dL - - -  Total Bilirubin 0.3 - 1.2 mg/dL - - -  Alkaline Phos 38 - 126 U/L - - -  AST 15 - 41 U/L - - -  ALT 0 - 44 U/L - - -    Estimated Creatinine Clearance: 123.9 mL/min (by C-G formula based on SCr of 1.14 mg/dL).  9.7(L. Rande Brunt, MD [facility] Phone Number: [phone] 02/28/2021 1:50 PM

## 2021-03-01 LAB — CBC
HCT: 22.5 % — ABNORMAL LOW (ref 39.0–52.0)
Hemoglobin: 7.2 g/dL — ABNORMAL LOW (ref 13.0–17.0)
MCH: 26.6 pg (ref 26.0–34.0)
MCHC: 32 g/dL (ref 30.0–36.0)
MCV: 83 fL (ref 80.0–100.0)
Platelets: 180 10*3/uL (ref 150–400)
RBC: 2.71 MIL/uL — ABNORMAL LOW (ref 4.22–5.81)
RDW: 15.5 % (ref 11.5–15.5)
WBC: 10.8 10*3/uL — ABNORMAL HIGH (ref 4.0–10.5)
nRBC: 0 % (ref 0.0–0.2)

## 2021-03-01 NOTE — Progress Notes (Signed)
Occupational Therapy Treatment Patient Details Name: Sean [name] MRN: [id number] DOB: 1971-07-05 Today's Date: 03/01/2021    History of present illness pt is a 50 y/o male admitted with pain and tingling Rt foot.  He was found to have bypass graft occlusion Rt LE.  He underwent thrombectomy Lt Popliteal to tibioperoneal trunk bypass.  PMH includes:  s/p Lt AKA, arthritis, CTS, chronic pain syndrome, depression, PAD, PVD,   OT comments  Pt. Seen for skilled OT treatment session.  Pt. Moving well. Performing bed mobility, LB dressing, toileting task all at S level of A.  Reports family assistance available at home upon d/c.  Follow Up Recommendations  No OT follow up;Supervision - Intermittent    Equipment Recommendations  None recommended by OT    Recommendations for Other Services      Precautions / Restrictions Precautions Precautions: Fall Precaution Comments: Rt AKA - has prosthesis       Mobility Bed Mobility Overal bed mobility: Needs Assistance Bed Mobility: Supine to Sit     Supine to sit: Supervision          Transfers Overall transfer level: Needs assistance Equipment used: Rolling walker (2 wheeled) Transfers: Sit to/from [employer] Transfers Sit to Stand: Supervision;From elevated surface Stand pivot transfers: Supervision            Balance                                           ADL either performed or assessed with clinical judgement   ADL Overall ADL's : Needs assistance/impaired     Grooming: Supervision/safety;Standing Grooming Details (indicate cue type and reason): simulated during standing in b.room to urinate             Lower Body Dressing: Modified independent;Sitting/lateral leans Lower Body Dressing Details (indicate cue type and reason): able to don prosthesis without assistance. Toilet Transfer: Management consultant Details (indicate cue type and  reason): stood with attempts to urinate, no safety issues noted unable to urinate Toileting- Architect and Hygiene: Supervision/safety;Sit to/from stand       Functional mobility during ADLs: Supervision/safety;Rolling walker General ADL Comments: pt. moving well, reports no pain during session.  aware of his prosthesis "being turned in" states he has an apt. for a new one soon     Vision       Perception     Praxis      Cognition Arousal/Alertness: Awake/alert Behavior During Therapy: WFL for tasks assessed/performed Overall Cognitive Status: Within Functional Limits for tasks assessed                                          Exercises     Shoulder Instructions       General Comments      Pertinent Vitals/ Pain       Pain Assessment: No/denies pain  Home Living                                          Prior Functioning/Environment              Frequency  Min 2X/week  Progress Toward Goals  OT Goals(current goals can now be found in the care plan section)  Progress towards OT goals: Progressing toward goals     Plan Discharge plan remains appropriate    Co-evaluation                 AM-PAC OT "6 Clicks" Daily Activity     Outcome Measure   Help from another person eating meals?: None Help from another person taking care of personal grooming?: A Little Help from another person toileting, which includes using toliet, bedpan, or urinal?: A Lot Help from another person bathing (including washing, rinsing, drying)?: A Lot Help from another person to put on and taking off regular upper body clothing?: A Little Help from another person to put on and taking off regular lower body clothing?: A Lot 6 Click Score: 16    End of Session Equipment Utilized During Treatment: Gait belt;Rolling walker (prosthesis)  OT Visit Diagnosis: Unsteadiness on feet (R26.81);Pain Pain - Right/Left:  Right Pain - part of body: Leg;Knee   Activity Tolerance Patient tolerated treatment well   Patient Left in chair;with call bell/phone within reach   Nurse Communication Other (comment) (reviewed with RN pt. moving well, no c/o pain. and had difficulty and was unable to urinate but did have urine in his urinal)        Time: 6433-2951 OT Time Calculation (min): 17 min  Charges: OT General Charges $OT Visit: 1 Visit OT Treatments $Self Care/Home Management : 8-22 mins  Boneta Lucks, COTA/L Acute Rehabilitation [phone]    Sean [name] 03/01/2021, 12:43 PM

## 2021-03-01 NOTE — Progress Notes (Signed)
Physical Therapy Treatment Patient Details Name: Sean [name] MRN: [id number] DOB: 1970/09/22 Today's Date: 03/01/2021    History of Present Illness pt is a 50 y/o male admitted with pain and tingling Rt foot.  He was found to have bypass graft occlusion Rt LE.  He underwent thrombectomy Lt Popliteal to tibioperoneal trunk bypass.  PMH includes:  s/p Lt AKA, arthritis, CTS, chronic pain syndrome, depression, PAD, PVD,    PT Comments    Patient progressing towards physical therapy goals. Patient functioning at supervision level for mobility for safety with RW. Patient negotiated 3 stairs with rail on L and RW staggered on R (simulated home stair navigation) with supervision. Bleeding noted from incision, notified RN. No PT follow up recommended at this time.     Follow Up Recommendations  No PT follow up;Supervision/Assistance - 24 hour     Equipment Recommendations  None recommended by PT    Recommendations for Other Services       Precautions / Restrictions Precautions Precautions: Fall Precaution Comments: Rt AKA - has prosthesis Restrictions Weight Bearing Restrictions: No    Mobility  Bed Mobility Overal bed mobility: Modified Independent                  Transfers Overall transfer level: Needs assistance Equipment used: Rolling Delainey Winstanley (2 wheeled) Transfers: Sit to/from Stand Sit to Stand: Supervision;From elevated surface         General transfer comment: supervision for safety from significantly elevated bed due to patient's height  Ambulation/Gait Ambulation/Gait assistance: Supervision Gait Distance (Feet): 150 Feet Assistive device: Rolling Kennadee Walthour (2 wheeled) Gait Pattern/deviations: Step-through pattern;Decreased stride length Gait velocity: decreased   General Gait Details: generally steady step through pattern , moderate use of the RW, mildly antalgic due to pain at incision.   Stairs Stairs: Yes Stairs assistance: Supervision Stair  Management: One rail Right;With Chakara Bognar;Step to pattern;Forwards Number of Stairs: 3 General stair comments: Patient demonstrate how he navigates stairs at home with rail on L and use of Izyan Ezzell staggering on L. Supervision for safety. No physical assist required.   Wheelchair Mobility    Modified Rankin (Stroke Patients Only)       Balance Overall balance assessment: Mild deficits observed, not formally tested                                          Cognition Arousal/Alertness: Awake/alert Behavior During Therapy: WFL for tasks assessed/performed Overall Cognitive Status: Within Functional Limits for tasks assessed                                        Exercises      General Comments        Pertinent Vitals/Pain Pain Assessment: No/denies pain    Home Living                      Prior Function            PT Goals (current goals can now be found in the care plan section) Acute Rehab PT Goals Patient Stated Goal: Independent PT Goal Formulation: With patient Time For Goal Achievement: 03/06/21 Potential to Achieve Goals: Good Progress towards PT goals: Progressing toward goals    Frequency    Min 4X/week  PT Plan Current plan remains appropriate    Co-evaluation              AM-PAC PT "6 Clicks" Mobility   Outcome Measure  Help needed turning from your back to your side while in a flat bed without using bedrails?: None Help needed moving from lying on your back to sitting on the side of a flat bed without using bedrails?: None Help needed moving to and from a bed to a chair (including a wheelchair)?: A Little Help needed standing up from a chair using your arms (e.g., wheelchair or bedside chair)?: A Little Help needed to walk in hospital room?: A Little Help needed climbing 3-5 steps with a railing? : A Little 6 Click Score: 20    End of Session Equipment Utilized During Treatment: Gait  belt Activity Tolerance: Patient tolerated treatment well Patient left: in bed;with call bell/phone within reach Nurse Communication: Mobility status PT Visit Diagnosis: Other abnormalities of gait and mobility (R26.89);Pain Pain - Right/Left: Right Pain - part of body: Leg     Time: 1436-1450 PT Time Calculation (min) (ACUTE ONLY): 14 min  Charges:  $Gait Training: 8-22 mins                     Elwyn Klosinski A. Dan Humphreys PT, DPT Acute Rehabilitation Services Pager [phone] Office [phone]     Viviann Spare 03/01/2021, 5:02 PM

## 2021-03-01 NOTE — Progress Notes (Signed)
[facility] [place] PROGRESS NOTE  ASSESSMENT / PLAN: Sean [name] is a 50 y.o. male with atherosclerosis of native arteries of right lower extremity. Status post thrombectomy of right superficial femoral to tibioperoneal trunk bypass 02/27/21 and posterior tibial angioplasty 03/01/21.  PRN pain control PT/OT/OOB/Ambulate ASA / Plavix / Statin JP removed Likely ready for DC in next 24-48 hours  SUBJECTIVE: R foot feels good. Has not been OOB since angiogram.  OBJECTIVE: BP 114/70 (BP Location: Right Arm)   Pulse 89   Temp 97.9 F (36.6 C) (Oral)   Resp 17   Ht 6\' 5"  (1.956 m)   Wt (!) 152 kg   SpO2 98%   BMI 39.74 kg/m   Intake/Output Summary (Last 24 hours) at 03/01/2021 1059 Last data filed at 03/01/2021 0626 Gross per 24 hour  Intake --  Output 4560 ml  Net -4560 ml   No distress RRR Unlabored R foot warm R calf incision mildly macerated. Re-dressed with dry dressing. JP removed - only serous fluid in bulb Strong R PT DS  CBC Latest Ref Rng & Units 03/01/2021 02/28/2021 02/28/2021  WBC 4.0 - 10.5 K/uL 10.8(H) 11.5(H) 13.7(H)  Hemoglobin 13.0 - 17.0 g/dL 7.2(L) 7.7(L) 7.3(L)  Hematocrit 39.0 - 52.0 % 22.5(L) 23.8(L) 22.2(L)  Platelets 150 - 400 K/uL 180 172 164     CMP Latest Ref Rng & Units 02/28/2021 02/27/2021 02/26/2021  Glucose 70 - 99 mg/dL - 04/29/2021) -  BUN 6 - 20 mg/dL - 9 -  Creatinine 837(G - 1.24 mg/dL 9.02 1.11 5.52  Sodium 135 - 145 mmol/L - 138 -  Potassium 3.5 - 5.1 mmol/L - 3.7 -  Chloride 98 - 111 mmol/L - 107 -  CO2 22 - 32 mmol/L - 23 -  Calcium 8.9 - 10.3 mg/dL - 8.6(L) -  Total Protein 6.5 - 8.1 g/dL - - -  Total Bilirubin 0.3 - 1.2 mg/dL - - -  Alkaline Phos 38 - 126 U/L - - -  AST 15 - 41 U/L - - -  ALT 0 - 44 U/L - - -    Estimated Creatinine Clearance: 134.6 mL/min (by C-G formula based on SCr of 1.05 mg/dL).  0.80. Rande Brunt, MD [facility] Phone Number: [phone] 03/01/2021  10:59 AM

## 2021-03-01 NOTE — Progress Notes (Signed)
Mobility Specialist: Progress Note   03/01/21 1811  Mobility  Activity Ambulated in hall  Level of Assistance Modified independent, requires aide device or extra time  Assistive Device Front wheel walker  Distance Ambulated (ft) 170 ft  Mobility Ambulated with assistance in hallway  Mobility Response Tolerated well  Mobility performed by Mobility specialist  $Mobility charge 1 Mobility   Pre-Mobility: 77 HR During Mobility: 120 HR Post-Mobility: 108 HR  Pt c/o 6.5/10 pain in R knee pre-mobility as well as during-mobility. Pt stopped for two standing breaks d/t his pain, otherwise asx. Pt is sitting EOB and is set-up with his dinner and call bell is in reach.   [facility] Geoff Dacanay Mobility Specialist Mobility Specialist Phone: [phone]

## 2021-03-02 LAB — CBC
HCT: 23.7 % — ABNORMAL LOW (ref 39.0–52.0)
Hemoglobin: 7.8 g/dL — ABNORMAL LOW (ref 13.0–17.0)
MCH: 27.3 pg (ref 26.0–34.0)
MCHC: 32.9 g/dL (ref 30.0–36.0)
MCV: 82.9 fL (ref 80.0–100.0)
Platelets: 198 10*3/uL (ref 150–400)
RBC: 2.86 MIL/uL — ABNORMAL LOW (ref 4.22–5.81)
RDW: 15.2 % (ref 11.5–15.5)
WBC: 9.1 10*3/uL (ref 4.0–10.5)
nRBC: 0.8 % — ABNORMAL HIGH (ref 0.0–0.2)

## 2021-03-02 MED ORDER — OXYCODONE-ACETAMINOPHEN 5-325 MG PO TABS: 1.0000 | ORAL_TABLET | ORAL | 0 refills | Status: DC | PRN

## 2021-03-02 NOTE — Discharge Instructions (Signed)
 [facility] [place]  Discharge instructions  Lower Extremity Bypass Surgery  Please refer to the following instruction for your post-procedure care. Your surgeon or physician assistant will discuss any changes with you.  Activity  You are encouraged to walk as much as you can. You can slowly return to normal activities during the month after your surgery. Avoid strenuous activity and heavy lifting until your doctor tells you it's OK. Avoid activities such as vacuuming or swinging a golf club. Do not drive until your doctor give the OK and you are no longer taking prescription pain medications. It is also normal to have difficulty with sleep habits, eating and bowel movement after surgery. These will go away with time.  Bathing/Showering  You may shower after you go home. Do not soak in a bathtub, hot tub, or swim until the incision heals completely.  Incision Care  Clean your incision with mild soap and water. Shower every day. Pat the area dry with a clean towel. You do not need a bandage unless otherwise instructed. Do not apply any ointments or creams to your incision. If you have open wounds you will be instructed how to care for them or a visiting nurse may be arranged for you. If you have staples or sutures along your incision they will be removed at your post-op appointment. You may have skin glue on your incision. Do not peel it off. It will come off on its own in about one week. If you have a great deal of moisture in your groin, use a gauze help keep this area dry.  Diet  Resume your normal diet. There are no special food restrictions following this procedure. A low fat/ low cholesterol diet is recommended for all patients with vascular disease. In order to heal from your surgery, it is CRITICAL to get adequate nutrition. Your body requires vitamins, minerals, and protein. Vegetables are the best source of vitamins and minerals. Vegetables also provide the  perfect balance of protein. Processed food has little nutritional value, so try to avoid this.  Medications  Resume taking all your medications unless your doctor or nurse practitioner tells you not to. If your incision is causing pain, you may take over-the-counter pain relievers such as acetaminophen (Tylenol). If you were prescribed a stronger pain medication, please aware these medication can cause nausea and constipation. Prevent nausea by taking the medication with a snack or meal. Avoid constipation by drinking plenty of fluids and eating foods with high amount of fiber, such as fruits, vegetables, and grains. Take Colase 100 mg (an over-the-counter stool softener) twice a day as needed for constipation. Do not take Tylenol if you are taking prescription pain medications.  Follow Up  Our office will schedule a follow up appointment 2-3 weeks following discharge.  Please call us immediately for any of the following conditions  Severe or worsening pain in your legs or feet while at rest or while walking Increase pain, redness, warmth, or drainage (pus) from your incision site(s) Fever of 101 degree or higher The swelling in your leg with the bypass suddenly worsens and becomes more painful than when you were in the hospital If you have been instructed to feel your graft pulse then you should do so every day. If you can no longer feel this pulse, call the office immediately. Not all patients are given this instruction.  Leg swelling is common after leg bypass surgery.  The swelling should improve over a few months   following surgery. To improve the swelling, you may elevate your legs above the level of your heart while you are sitting or resting. Your surgeon or physician assistant may ask you to apply an ACE wrap or wear compression (TED) stockings to help to reduce swelling.  Reduce your risk of vascular disease  Stop smoking. If you would like help call [facility] at 1-800-QUIT-NOW  (1-800-784-8669) or [place] at 336-586-4000.  Manage your cholesterol Maintain a desired weight Control your diabetes weight Control your diabetes Keep your blood pressure down  If you have any questions, please call the office at [phone]   

## 2021-03-02 NOTE — Progress Notes (Signed)
Discharge instructions provided to patient. All medications, follow up appointments, and discharge instructions discussed. IV out. Monitor off. CCMD notified.Discharging home with family.  Jermany Rimel R Kehaulani Fruin, RN  

## 2021-03-02 NOTE — Progress Notes (Signed)
Mobility Specialist: Progress Note   03/02/21 1153  Mobility  Activity Ambulated in hall  Level of Assistance Modified independent, requires aide device or extra time  Assistive Device Front wheel walker  Distance Ambulated (ft) 110 ft  Mobility Ambulated with assistance in hallway  Mobility Response Tolerated well  Mobility performed by Mobility specialist  $Mobility charge 1 Mobility   Pre-Mobility: 83 HR During Mobility: 121 HR Post-Mobility: 102 HR, 100% SpO2  Pt asx throughout ambulation. Pt said he R knee was doing better today as well. Pt is sitting EOB after walk with call bell in reach.   [facility] Corene Resnick Mobility Specialist Mobility Specialist Phone: [phone]

## 2021-03-02 NOTE — Discharge Summary (Signed)
[facility] Discharge Summary   Patient ID:  Sean [name] MRN: [id number] DOB/AGE: 1971-04-16 50 y.o.  Admit date: 02/26/2021 Discharge date: 03/02/2021 Attending Surgeon: Lenell Antu Admission Diagnosis: Peripheral arterial disease [facility]) [I73.9]  Discharge Diagnoses:  Peripheral arterial disease (HCC) [I73.9]  Secondary Diagnoses: Past Medical History:  Diagnosis Date   Acquired deformity of right hand 11/27/2015   Formatting of this note might be different from the original. Secondary to injury/surgeries   AKA stump complication (HCC) 02/13/2020   Arthritis 05/06/2014   Carpal tunnel syndrome 05/06/2014   Chronic pain syndrome 02/13/2020   Class 2 severe obesity due to excess calories with serious comorbidity and body mass index (BMI) of 35.0 to 35.9 in adult [facility]) 12/10/2020   Depression    Dislocation of distal interphalangeal (DIP) joint of finger 05/06/2014   Encounter for immunization 12/05/2020   Erectile dysfunction due to diseases classified elsewhere 04/06/2020   Essential hypertension 11/27/2015   History of left below knee amputation (HCC) 05/16/2019   Hypertension    Impaired mobility and ADLs 05/16/2019   Ischemia of extremity 09/21/2018   Ischemia of left lower extremity 04/28/2019   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 Added automatically from request for surgery [id number]   Other long term (current) drug therapy 12/05/2020   PAD (peripheral artery disease) (HCC) 04/28/2019   Phantom limb syndrome with pain (HCC) 09/12/2019   Formatting of this note might be different from the original. 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 male status post right superficial femoral artery to tibioperoneal trunk bypass with ipsilateral greater saphenous vein 01/23/21. He presents to the ER today with worsening discomfort in his right foot. He reports his foot feels like it did before the bypass surgery. He is status post left above knee amputation. He has known superficial dehiscence of his right calf which appears unchanged from prior.   Hospital Course: Patient admitted 02/26/21. Underwent bypass graft thrombectomy 02/26/21 by Dr. Myra Gianotti with restoration of flow. Underwent angiography 02/28/21 by me to evaluate bypass. Outflow stenosis treated with angioplasty. Tolerated all well. JP removed 03/01/21. Ready for DC 03/02/21: tolerating diet, ambulating, pain well controlled, good DS in R foot  Consults:  Treatment Team:  Leonie Douglas, MD  Significant Diagnostic Studies: CBC    Component Value Date/Time   WBC 9.1 03/02/2021 0203   RBC 2.86 (L) 03/02/2021 0203   HGB 7.8 (L) 03/02/2021 0203   HCT 23.7 (L) 03/02/2021 0203   PLT 198 03/02/2021 0203   MCV 82.9 03/02/2021 0203   MCH 27.3 03/02/2021 0203   MCHC 32.9 03/02/2021 0203   RDW 15.2 03/02/2021 0203   LYMPHSABS 4.4 (H) 02/26/2021 0630   MONOABS 0.6 02/26/2021 0630   EOSABS 0.4 02/26/2021 0630   BASOSABS 0.0 02/26/2021 0630    BMET    Component Value Date/Time   NA 138 02/27/2021 0143   K 3.7 02/27/2021 0143   CL 107 02/27/2021 0143   CO2 23 02/27/2021 0143   GLUCOSE 226 (H) 02/27/2021 0143   BUN 9 02/27/2021 0143   CREATININE 1.05 02/28/2021 1751  CALCIUM 8.6 (L) 02/27/2021 0143   GFRNONAA >60 02/28/2021 1751   GFRAA >60 02/13/2020 0058    COAG estimated creatinine clearance is 134.6 mL/min (by C-G formula based on SCr of 1.05 mg/dL).  No results found for: PTT  Disposition:  Discharge to :Home Discharge Instructions     Call MD for:   redness, tenderness, or signs of infection (pain, swelling, bleeding, redness, odor or green/yellow discharge around incision site)   Complete by: As directed    Call MD for:  severe or increased pain, loss or decreased feeling  in affected limb(s)   Complete by: As directed    Call MD for:  temperature >100.5   Complete by: As directed    Driving Restrictions   Complete by: As directed    No driving while taking narcotic pain medicine. Only drive when you feel comfortable working pedals.   Lifting restrictions   Complete by: As directed    No heavy lifting for 2 weeks   Resume previous diet   Complete by: As directed       Allergies as of 03/02/2021   No Known Allergies      Medication List     TAKE these medications    amLODipine 10 MG tablet Commonly known as: NORVASC Take 10 mg by mouth in the morning.   aspirin EC 81 MG tablet Take 81 mg by mouth in the morning.   Belbuca 300 MCG Film Generic drug: Buprenorphine HCl Place 300 mcg inside cheek in the morning and at bedtime.   bisacodyl 5 MG EC tablet Commonly known as: DULCOLAX Take 5 mg by mouth daily as needed for moderate constipation.   clobetasol ointment 0.05 % Commonly known as: TEMOVATE Apply 1 application topically 2 (two) times daily as needed (skin irritation).   clopidogrel 75 MG tablet Commonly known as: PLAVIX Take 1 tablet (75 mg total) by mouth daily with breakfast.   cyanocobalamin 1000 MCG/ML injection Commonly known as: (VITAMIN B-12) Inject 1,000 mcg into the muscle once a week.   DULoxetine 30 MG capsule Commonly known as: CYMBALTA Take 30 mg by mouth in the morning.   Humira Pen 40 MG/0.4ML Pnkt Generic drug: Adalimumab Inject 40 mg into the skin every 14 (fourteen) days.   hydrALAZINE 10 MG tablet Commonly known as: APRESOLINE Take 10 mg by mouth 2 (two) times daily.   olmesartan-hydrochlorothiazide 40-25 MG tablet Commonly known as: BENICAR HCT Take 1 tablet by mouth  daily.   oxyCODONE-acetaminophen 5-325 MG tablet Commonly known as: PERCOCET/ROXICET Take 1-2 tablets by mouth every 4 (four) hours as needed for moderate pain.   potassium chloride SA 20 MEQ tablet Commonly known as: KLOR-CON Take 20 mEq by mouth daily.   pregabalin 300 MG capsule Commonly known as: LYRICA Take 300 mg by mouth daily.   rosuvastatin 10 MG tablet Commonly known as: CRESTOR Take 10 mg by mouth daily.   Testosterone 20.25 MG/ACT (1.62%) Gel Apply 40.5 mg topically daily. Rub on shoulder   Vitamin D (Ergocalciferol) 1.25 MG (50000 UNIT) Caps capsule Commonly known as: DRISDOL Take 50,000 Units by mouth every Thursday.   zinc gluconate 50 MG tablet Take 50 mg by mouth daily.         Signed: Leonie Douglas 03/02/2021, 10:12 AM

## 2021-03-03 ENCOUNTER — Encounter (HOSPITAL_COMMUNITY): Payer: Self-pay | Admitting: Vascular Surgery

## 2021-03-03 LAB — CULTURE, BLOOD (ROUTINE X 2)
Culture: NO GROWTH
Culture: NO GROWTH
Special Requests: ADEQUATE

## 2021-03-06 ENCOUNTER — Inpatient Hospital Stay (HOSPITAL_COMMUNITY)
Admission: EM | Admit: 2021-03-06 | Discharge: 2021-03-12 | DRG: 862 | Disposition: A | Discharge status: Home Health | Payer: BLUE CROSS/BLUE SHIELD | Attending: Internal Medicine | Admitting: Internal Medicine

## 2021-03-06 ENCOUNTER — Telehealth: Payer: Self-pay

## 2021-03-06 DIAGNOSIS — I10 Essential (primary) hypertension: Secondary | ICD-10-CM | POA: Diagnosis present

## 2021-03-06 DIAGNOSIS — L03115 Cellulitis of right lower limb: Secondary | ICD-10-CM | POA: Diagnosis present

## 2021-03-06 DIAGNOSIS — I712 Thoracic aortic aneurysm, without rupture: Secondary | ICD-10-CM | POA: Diagnosis present

## 2021-03-06 DIAGNOSIS — L089 Local infection of the skin and subcutaneous tissue, unspecified: Secondary | ICD-10-CM | POA: Diagnosis present

## 2021-03-06 DIAGNOSIS — I739 Peripheral vascular disease, unspecified: Secondary | ICD-10-CM | POA: Diagnosis present

## 2021-03-06 DIAGNOSIS — J181 Lobar pneumonia, unspecified organism: Secondary | ICD-10-CM | POA: Diagnosis present

## 2021-03-06 DIAGNOSIS — T8130XA Disruption of wound, unspecified, initial encounter: Secondary | ICD-10-CM | POA: Diagnosis not present

## 2021-03-06 DIAGNOSIS — F17210 Nicotine dependence, cigarettes, uncomplicated: Secondary | ICD-10-CM | POA: Diagnosis present

## 2021-03-06 DIAGNOSIS — G894 Chronic pain syndrome: Secondary | ICD-10-CM | POA: Diagnosis present

## 2021-03-06 DIAGNOSIS — Z7902 Long term (current) use of antithrombotics/antiplatelets: Secondary | ICD-10-CM

## 2021-03-06 DIAGNOSIS — D75839 Thrombocytosis, unspecified: Secondary | ICD-10-CM | POA: Diagnosis present

## 2021-03-06 DIAGNOSIS — D649 Anemia, unspecified: Secondary | ICD-10-CM | POA: Diagnosis present

## 2021-03-06 DIAGNOSIS — T148XXA Other injury of unspecified body region, initial encounter: Secondary | ICD-10-CM | POA: Diagnosis present

## 2021-03-06 DIAGNOSIS — T8131XA Disruption of external operation (surgical) wound, not elsewhere classified, initial encounter: Secondary | ICD-10-CM | POA: Diagnosis present

## 2021-03-06 DIAGNOSIS — Y832 Surgical operation with anastomosis, bypass or graft as the cause of abnormal reaction of the patient, or of later complication, without mention of misadventure at the time of the procedure: Secondary | ICD-10-CM | POA: Diagnosis present

## 2021-03-06 DIAGNOSIS — Z7982 Long term (current) use of aspirin: Secondary | ICD-10-CM

## 2021-03-06 DIAGNOSIS — T8141XA Infection following a procedure, superficial incisional surgical site, initial encounter: Secondary | ICD-10-CM | POA: Diagnosis not present

## 2021-03-06 DIAGNOSIS — Z20822 Contact with and (suspected) exposure to covid-19: Secondary | ICD-10-CM | POA: Diagnosis present

## 2021-03-06 DIAGNOSIS — D62 Acute posthemorrhagic anemia: Secondary | ICD-10-CM | POA: Diagnosis present

## 2021-03-06 DIAGNOSIS — Z89612 Acquired absence of left leg above knee: Secondary | ICD-10-CM

## 2021-03-06 DIAGNOSIS — F32A Depression, unspecified: Secondary | ICD-10-CM | POA: Diagnosis present

## 2021-03-06 DIAGNOSIS — A419 Sepsis, unspecified organism: Secondary | ICD-10-CM | POA: Diagnosis present

## 2021-03-06 DIAGNOSIS — I70201 Unspecified atherosclerosis of native arteries of extremities, right leg: Secondary | ICD-10-CM | POA: Diagnosis present

## 2021-03-06 DIAGNOSIS — Z79899 Other long term (current) drug therapy: Secondary | ICD-10-CM

## 2021-03-06 DIAGNOSIS — I7121 Aneurysm of the ascending aorta, without rupture: Secondary | ICD-10-CM | POA: Diagnosis present

## 2021-03-06 DIAGNOSIS — L409 Psoriasis, unspecified: Secondary | ICD-10-CM | POA: Diagnosis present

## 2021-03-06 DIAGNOSIS — T8140XA Infection following a procedure, unspecified, initial encounter: Secondary | ICD-10-CM

## 2021-03-06 NOTE — ED Triage Notes (Signed)
Pt from home via EMS for eval of wound dehiscence after thrombectomy approx 1wk ago. Bleeding & pus from wound, unable to ambulate using R leg. L AKA Thinners from sx, hx hypertension  VSS 10/10

## 2021-03-06 NOTE — Telephone Encounter (Addendum)
Carollee Herter calls again to report that the wound has opened even more. UTR her, but called the patient - does not sound like the wound is infected, but unclear what is actually happening. Discussed with PA, advised patient could do wet to dry dressing changes over the open areas and placed him on TH schedule Tuesday.  Carollee Herter calls from [facility] to ask about dressing patient's wound. For some reason, patient has been applying a wet to dry dressing over staples - wound is now slightly open. Advised to cover with adaptic, ABD and tape. Patient is also concerned about running out of pain medication. He has been taking one q 4 hours and has not taken his subutex. Advised he could take 2 at night since he is having trouble sleeping. He is also a patient of a pain clinic. She wants to know if he can take tylenol or ibuprofen. Instructed not to exceed 4 grams of tylenol from any source. Also wanted to know if patient could walk. Advised that he could, put pressure on the heel and use prosthetic. Patient will keep follow up appt on 03/21/21.

## 2021-03-07 ENCOUNTER — Inpatient Hospital Stay (HOSPITAL_COMMUNITY): Payer: BLUE CROSS/BLUE SHIELD | Admitting: Registered Nurse

## 2021-03-07 ENCOUNTER — Encounter (HOSPITAL_COMMUNITY): Payer: Self-pay | Admitting: Family Medicine

## 2021-03-07 ENCOUNTER — Encounter (HOSPITAL_COMMUNITY)
Admission: EM | Disposition: A | Discharge status: Home Health | Payer: Self-pay | Source: Home / Self Care | Attending: Internal Medicine

## 2021-03-07 DIAGNOSIS — I10 Essential (primary) hypertension: Secondary | ICD-10-CM | POA: Diagnosis present

## 2021-03-07 DIAGNOSIS — T8130XA Disruption of wound, unspecified, initial encounter: Secondary | ICD-10-CM | POA: Diagnosis present

## 2021-03-07 DIAGNOSIS — D649 Anemia, unspecified: Secondary | ICD-10-CM

## 2021-03-07 DIAGNOSIS — Z89612 Acquired absence of left leg above knee: Secondary | ICD-10-CM | POA: Diagnosis not present

## 2021-03-07 DIAGNOSIS — T148XXA Other injury of unspecified body region, initial encounter: Secondary | ICD-10-CM | POA: Diagnosis not present

## 2021-03-07 DIAGNOSIS — Z79899 Other long term (current) drug therapy: Secondary | ICD-10-CM | POA: Diagnosis not present

## 2021-03-07 DIAGNOSIS — I469 Cardiac arrest, cause unspecified: Secondary | ICD-10-CM | POA: Diagnosis present

## 2021-03-07 DIAGNOSIS — I70201 Unspecified atherosclerosis of native arteries of extremities, right leg: Secondary | ICD-10-CM | POA: Diagnosis present

## 2021-03-07 DIAGNOSIS — G894 Chronic pain syndrome: Secondary | ICD-10-CM

## 2021-03-07 DIAGNOSIS — T8189XA Other complications of procedures, not elsewhere classified, initial encounter: Secondary | ICD-10-CM | POA: Diagnosis not present

## 2021-03-07 DIAGNOSIS — I712 Thoracic aortic aneurysm, without rupture: Secondary | ICD-10-CM | POA: Diagnosis present

## 2021-03-07 DIAGNOSIS — D62 Acute posthemorrhagic anemia: Secondary | ICD-10-CM | POA: Diagnosis present

## 2021-03-07 DIAGNOSIS — L409 Psoriasis, unspecified: Secondary | ICD-10-CM | POA: Diagnosis present

## 2021-03-07 DIAGNOSIS — T8141XA Infection following a procedure, superficial incisional surgical site, initial encounter: Secondary | ICD-10-CM | POA: Diagnosis present

## 2021-03-07 DIAGNOSIS — T8131XA Disruption of external operation (surgical) wound, not elsewhere classified, initial encounter: Secondary | ICD-10-CM | POA: Diagnosis present

## 2021-03-07 DIAGNOSIS — Y832 Surgical operation with anastomosis, bypass or graft as the cause of abnormal reaction of the patient, or of later complication, without mention of misadventure at the time of the procedure: Secondary | ICD-10-CM | POA: Diagnosis present

## 2021-03-07 DIAGNOSIS — R140 Abdominal distension (gaseous): Secondary | ICD-10-CM | POA: Diagnosis not present

## 2021-03-07 DIAGNOSIS — I739 Peripheral vascular disease, unspecified: Secondary | ICD-10-CM

## 2021-03-07 DIAGNOSIS — F17210 Nicotine dependence, cigarettes, uncomplicated: Secondary | ICD-10-CM | POA: Diagnosis present

## 2021-03-07 DIAGNOSIS — Z7902 Long term (current) use of antithrombotics/antiplatelets: Secondary | ICD-10-CM | POA: Diagnosis not present

## 2021-03-07 DIAGNOSIS — I7121 Aneurysm of the ascending aorta, without rupture: Secondary | ICD-10-CM | POA: Diagnosis present

## 2021-03-07 DIAGNOSIS — L089 Local infection of the skin and subcutaneous tissue, unspecified: Secondary | ICD-10-CM | POA: Diagnosis not present

## 2021-03-07 DIAGNOSIS — T8140XA Infection following a procedure, unspecified, initial encounter: Secondary | ICD-10-CM

## 2021-03-07 DIAGNOSIS — D75839 Thrombocytosis, unspecified: Secondary | ICD-10-CM | POA: Diagnosis present

## 2021-03-07 DIAGNOSIS — Z7982 Long term (current) use of aspirin: Secondary | ICD-10-CM | POA: Diagnosis not present

## 2021-03-07 DIAGNOSIS — Z20822 Contact with and (suspected) exposure to covid-19: Secondary | ICD-10-CM | POA: Diagnosis present

## 2021-03-07 DIAGNOSIS — F32A Depression, unspecified: Secondary | ICD-10-CM | POA: Diagnosis present

## 2021-03-07 DIAGNOSIS — L03115 Cellulitis of right lower limb: Secondary | ICD-10-CM

## 2021-03-07 DIAGNOSIS — J181 Lobar pneumonia, unspecified organism: Secondary | ICD-10-CM

## 2021-03-07 DIAGNOSIS — A419 Sepsis, unspecified organism: Secondary | ICD-10-CM | POA: Diagnosis present

## 2021-03-07 HISTORY — PX: APPLICATION OF WOUND VAC: SHX5189

## 2021-03-07 HISTORY — PX: I & D EXTREMITY: SHX5045

## 2021-03-07 LAB — COMPREHENSIVE METABOLIC PANEL
ALT: 18 U/L (ref 0–44)
AST: 14 U/L — ABNORMAL LOW (ref 15–41)
Albumin: 3.2 g/dL — ABNORMAL LOW (ref 3.5–5.0)
Alkaline Phosphatase: 60 U/L (ref 38–126)
Anion gap: 9 (ref 5–15)
BUN: 11 mg/dL (ref 6–20)
CO2: 26 mmol/L (ref 22–32)
Calcium: 8.7 mg/dL — ABNORMAL LOW (ref 8.9–10.3)
Chloride: 100 mmol/L (ref 98–111)
Creatinine, Ser: 1.04 mg/dL (ref 0.61–1.24)
GFR, Estimated: >60 mL/min (ref >60)
Glucose, Bld: 161 mg/dL — ABNORMAL HIGH (ref 70–99)
Potassium: 3.8 mmol/L (ref 3.5–5.1)
Sodium: 135 mmol/L (ref 135–145)
Total Bilirubin: 0.6 mg/dL (ref 0.3–1.2)
Total Protein: 6.3 g/dL — ABNORMAL LOW (ref 6.5–8.1)

## 2021-03-07 LAB — CBC WITH DIFFERENTIAL/PLATELET
Abs Immature Granulocytes: 0.09 10*3/uL — ABNORMAL HIGH (ref 0.00–0.07)
Basophils Absolute: 0 10*3/uL (ref 0.0–0.1)
Basophils Relative: 0 %
Eosinophils Absolute: 0.2 10*3/uL (ref 0.0–0.5)
Eosinophils Relative: 1 %
HCT: 27.4 % — ABNORMAL LOW (ref 39.0–52.0)
Hemoglobin: 8.7 g/dL — ABNORMAL LOW (ref 13.0–17.0)
Immature Granulocytes: 1 %
Lymphocytes Relative: 32 %
Lymphs Abs: 4.6 10*3/uL — ABNORMAL HIGH (ref 0.7–4.0)
MCH: 26 pg (ref 26.0–34.0)
MCHC: 31.8 g/dL (ref 30.0–36.0)
MCV: 82 fL (ref 80.0–100.0)
Monocytes Absolute: 1 10*3/uL (ref 0.1–1.0)
Monocytes Relative: 7 %
Neutro Abs: 8.6 10*3/uL — ABNORMAL HIGH (ref 1.7–7.7)
Neutrophils Relative %: 59 %
Platelets: 413 10*3/uL — ABNORMAL HIGH (ref 150–400)
RBC: 3.34 MIL/uL — ABNORMAL LOW (ref 4.22–5.81)
RDW: 15.7 % — ABNORMAL HIGH (ref 11.5–15.5)
WBC: 14.5 10*3/uL — ABNORMAL HIGH (ref 4.0–10.5)
nRBC: 0.2 % (ref 0.0–0.2)

## 2021-03-07 LAB — RESP PANEL BY RT-PCR (FLU A&B, COVID) ARPGX2
Influenza A by PCR: NEGATIVE
Influenza B by PCR: NEGATIVE
SARS Coronavirus 2 by RT PCR: NEGATIVE

## 2021-03-07 SURGERY — IRRIGATION AND DEBRIDEMENT EXTREMITY
Anesthesia: General | Site: Leg Lower | Laterality: Right

## 2021-03-07 MED ORDER — ONDANSETRON HCL 4 MG/2ML IJ SOLN: 4.0000 mg | Freq: Four times a day (QID) | INTRAMUSCULAR | Status: DC | PRN

## 2021-03-07 MED ORDER — DULOXETINE HCL 30 MG PO CPEP
30.0000 mg | ORAL_CAPSULE | Freq: Every day | ORAL | Status: DC
  Administered 2021-03-07 – 2021-03-12 (×6): 30 mg via ORAL
  Filled 2021-03-07 (×7): qty 1

## 2021-03-07 MED ORDER — PHENOL 1.4 % MT LIQD
1.0000 | OROMUCOSAL | Status: DC | PRN
Start: 2021-03-07 — End: 2021-03-12

## 2021-03-07 MED ORDER — HEPARIN SODIUM (PORCINE) 5000 UNIT/ML IJ SOLN
5000.0000 [IU] | Freq: Three times a day (TID) | INTRAMUSCULAR | Status: DC
  Administered 2021-03-07 – 2021-03-12 (×13): 5000 [IU] via SUBCUTANEOUS
  Filled 2021-03-07 (×14): qty 1

## 2021-03-07 MED ORDER — SODIUM CHLORIDE 0.9 % IV SOLN
2.0000 g | Freq: Three times a day (TID) | INTRAVENOUS | Status: DC
  Administered 2021-03-07: 2 g via INTRAVENOUS
  Filled 2021-03-07: qty 2

## 2021-03-07 MED ORDER — NICOTINE 21 MG/24HR TD PT24
21.0000 mg | MEDICATED_PATCH | Freq: Every day | TRANSDERMAL | Status: DC
  Administered 2021-03-07 – 2021-03-12 (×6): 21 mg via TRANSDERMAL
  Filled 2021-03-07 (×6): qty 1

## 2021-03-07 MED ORDER — ACETAMINOPHEN 650 MG RE SUPP: 650.0000 mg | Freq: Four times a day (QID) | RECTAL | Status: DC | PRN

## 2021-03-07 MED ORDER — PANTOPRAZOLE SODIUM 40 MG PO TBEC
40.0000 mg | DELAYED_RELEASE_TABLET | Freq: Every day | ORAL | Status: DC
  Administered 2021-03-08 – 2021-03-12 (×5): 40 mg via ORAL
  Filled 2021-03-07 (×5): qty 1

## 2021-03-07 MED ORDER — OXYCODONE HCL 5 MG/5ML PO SOLN: 5.0000 mg | Freq: Once | ORAL | Status: DC | PRN

## 2021-03-07 MED ORDER — METOPROLOL TARTRATE 5 MG/5ML IV SOLN: 2.0000 mg | INTRAVENOUS | Status: DC | PRN

## 2021-03-07 MED ORDER — PROPOFOL 1000 MG/100ML IV EMUL
INTRAVENOUS | Status: AC
  Filled 2021-03-07: qty 100

## 2021-03-07 MED ORDER — PIPERACILLIN-TAZOBACTAM 3.375 G IVPB 30 MIN
3.3750 g | Freq: Once | INTRAVENOUS | Status: AC
  Administered 2021-03-07: 3.375 g via INTRAVENOUS
  Filled 2021-03-07: qty 50

## 2021-03-07 MED ORDER — OXYCODONE HCL 5 MG PO TABS: 5.0000 mg | ORAL_TABLET | Freq: Once | ORAL | Status: DC | PRN

## 2021-03-07 MED ORDER — VANCOMYCIN HCL 2000 MG/400ML IV SOLN
2000.0000 mg | Freq: Once | INTRAVENOUS | Status: AC
  Administered 2021-03-07: 2000 mg via INTRAVENOUS
  Filled 2021-03-07: qty 400

## 2021-03-07 MED ORDER — ACETAMINOPHEN 325 MG PO TABS: 650.0000 mg | ORAL_TABLET | Freq: Four times a day (QID) | ORAL | Status: DC | PRN

## 2021-03-07 MED ORDER — PROPOFOL 10 MG/ML IV BOLUS
INTRAVENOUS | Status: DC | PRN
  Administered 2021-03-07: 200 mg via INTRAVENOUS

## 2021-03-07 MED ORDER — FENTANYL CITRATE (PF) 100 MCG/2ML IJ SOLN: 25.0000 ug | INTRAMUSCULAR | Status: DC | PRN

## 2021-03-07 MED ORDER — PHENYLEPHRINE 40 MCG/ML (10ML) SYRINGE FOR IV PUSH (FOR BLOOD PRESSURE SUPPORT)
PREFILLED_SYRINGE | INTRAVENOUS | Status: DC | PRN
  Administered 2021-03-07 (×4): 80 ug via INTRAVENOUS

## 2021-03-07 MED ORDER — LABETALOL HCL 5 MG/ML IV SOLN: 10.0000 mg | INTRAVENOUS | Status: DC | PRN

## 2021-03-07 MED ORDER — ENOXAPARIN SODIUM 40 MG/0.4ML IJ SOSY: 40.0000 mg | PREFILLED_SYRINGE | INTRAMUSCULAR | Status: DC

## 2021-03-07 MED ORDER — FENTANYL CITRATE (PF) 100 MCG/2ML IJ SOLN
100.0000 ug | Freq: Once | INTRAMUSCULAR | Status: AC
  Administered 2021-03-07: 100 ug via INTRAVENOUS
  Filled 2021-03-07: qty 2

## 2021-03-07 MED ORDER — ACETAMINOPHEN 325 MG RE SUPP
325.0000 mg | RECTAL | Status: DC | PRN
  Filled 2021-03-07: qty 2

## 2021-03-07 MED ORDER — MIDAZOLAM HCL 2 MG/2ML IJ SOLN
INTRAMUSCULAR | Status: AC
  Filled 2021-03-07: qty 2

## 2021-03-07 MED ORDER — DOCUSATE SODIUM 100 MG PO CAPS
100.0000 mg | ORAL_CAPSULE | Freq: Two times a day (BID) | ORAL | Status: DC
  Administered 2021-03-07 – 2021-03-10 (×5): 100 mg via ORAL
  Filled 2021-03-07 (×7): qty 1

## 2021-03-07 MED ORDER — ENOXAPARIN SODIUM 80 MG/0.8ML IJ SOSY
75.0000 mg | PREFILLED_SYRINGE | INTRAMUSCULAR | Status: DC
  Filled 2021-03-07: qty 0.75

## 2021-03-07 MED ORDER — LIDOCAINE 2% (20 MG/ML) 5 ML SYRINGE
INTRAMUSCULAR | Status: DC | PRN
  Administered 2021-03-07: 100 mg via INTRAVENOUS

## 2021-03-07 MED ORDER — CLOPIDOGREL BISULFATE 75 MG PO TABS
75.0000 mg | ORAL_TABLET | Freq: Every day | ORAL | Status: DC
  Administered 2021-03-07 – 2021-03-12 (×6): 75 mg via ORAL
  Filled 2021-03-07 (×6): qty 1

## 2021-03-07 MED ORDER — ROSUVASTATIN CALCIUM 5 MG PO TABS
10.0000 mg | ORAL_TABLET | Freq: Every day | ORAL | Status: DC
  Administered 2021-03-07 – 2021-03-12 (×6): 10 mg via ORAL
  Filled 2021-03-07 (×6): qty 2

## 2021-03-07 MED ORDER — ASPIRIN EC 81 MG PO TBEC
81.0000 mg | DELAYED_RELEASE_TABLET | Freq: Every day | ORAL | Status: DC
  Administered 2021-03-07 – 2021-03-12 (×6): 81 mg via ORAL
  Filled 2021-03-07 (×6): qty 1

## 2021-03-07 MED ORDER — GUAIFENESIN-DM 100-10 MG/5ML PO SYRP: 15.0000 mL | ORAL_SOLUTION | ORAL | Status: DC | PRN

## 2021-03-07 MED ORDER — PREGABALIN 100 MG PO CAPS
300.0000 mg | ORAL_CAPSULE | Freq: Every day | ORAL | Status: DC
  Administered 2021-03-07 – 2021-03-12 (×6): 300 mg via ORAL
  Filled 2021-03-07 (×6): qty 3

## 2021-03-07 MED ORDER — SODIUM CHLORIDE 0.9% FLUSH: 3.0000 mL | INTRAVENOUS | Status: DC | PRN

## 2021-03-07 MED ORDER — SODIUM CHLORIDE 0.9% FLUSH
3.0000 mL | Freq: Two times a day (BID) | INTRAVENOUS | Status: DC
  Administered 2021-03-07 – 2021-03-12 (×6): 3 mL via INTRAVENOUS

## 2021-03-07 MED ORDER — ORAL CARE MOUTH RINSE: 15.0000 mL | Freq: Once | OROMUCOSAL | Status: AC

## 2021-03-07 MED ORDER — PROMETHAZINE HCL 25 MG/ML IJ SOLN: 6.2500 mg | INTRAMUSCULAR | Status: DC | PRN

## 2021-03-07 MED ORDER — FENTANYL CITRATE (PF) 250 MCG/5ML IJ SOLN
INTRAMUSCULAR | Status: DC | PRN
  Administered 2021-03-07 (×2): 50 ug via INTRAVENOUS

## 2021-03-07 MED ORDER — ONDANSETRON HCL 4 MG PO TABS: 4.0000 mg | ORAL_TABLET | Freq: Four times a day (QID) | ORAL | Status: DC | PRN

## 2021-03-07 MED ORDER — ONDANSETRON HCL 4 MG/2ML IJ SOLN
INTRAMUSCULAR | Status: AC
  Filled 2021-03-07: qty 2

## 2021-03-07 MED ORDER — BISACODYL 5 MG PO TBEC: 5.0000 mg | DELAYED_RELEASE_TABLET | Freq: Every day | ORAL | Status: DC | PRN

## 2021-03-07 MED ORDER — PROPOFOL 10 MG/ML IV BOLUS
INTRAVENOUS | Status: AC
  Filled 2021-03-07: qty 20

## 2021-03-07 MED ORDER — LACTATED RINGERS IV SOLN: INTRAVENOUS | Status: DC

## 2021-03-07 MED ORDER — CLOBETASOL PROPIONATE 0.05 % EX OINT: 1.0000 "application " | TOPICAL_OINTMENT | Freq: Two times a day (BID) | CUTANEOUS | Status: DC | PRN

## 2021-03-07 MED ORDER — PIPERACILLIN-TAZOBACTAM 3.375 G IVPB
3.3750 g | Freq: Three times a day (TID) | INTRAVENOUS | Status: DC
  Administered 2021-03-07 – 2021-03-11 (×11): 3.375 g via INTRAVENOUS
  Filled 2021-03-07 (×11): qty 50

## 2021-03-07 MED ORDER — BUPRENORPHINE HCL 300 MCG BU FILM: 300.0000 ug | ORAL_FILM | Freq: Two times a day (BID) | BUCCAL | Status: DC

## 2021-03-07 MED ORDER — 0.9 % SODIUM CHLORIDE (POUR BTL) OPTIME
TOPICAL | Status: DC | PRN
  Administered 2021-03-07: 1000 mL

## 2021-03-07 MED ORDER — ONDANSETRON HCL 4 MG/2ML IJ SOLN
INTRAMUSCULAR | Status: DC | PRN
  Administered 2021-03-07: 4 mg via INTRAVENOUS

## 2021-03-07 MED ORDER — PHENYLEPHRINE HCL-NACL 10-0.9 MG/250ML-% IV SOLN
INTRAVENOUS | Status: DC | PRN
  Administered 2021-03-07: 50 ug/min via INTRAVENOUS

## 2021-03-07 MED ORDER — LACTATED RINGERS IV SOLN: INTRAVENOUS | Status: AC

## 2021-03-07 MED ORDER — HYDRALAZINE HCL 20 MG/ML IJ SOLN: 5.0000 mg | INTRAMUSCULAR | Status: DC | PRN

## 2021-03-07 MED ORDER — VANCOMYCIN HCL 1750 MG/350ML IV SOLN
1750.0000 mg | Freq: Two times a day (BID) | INTRAVENOUS | Status: DC
  Administered 2021-03-07 – 2021-03-11 (×8): 1750 mg via INTRAVENOUS
  Filled 2021-03-07 (×9): qty 350

## 2021-03-07 MED ORDER — MORPHINE SULFATE (PF) 2 MG/ML IV SOLN
2.0000 mg | INTRAVENOUS | Status: DC | PRN
  Administered 2021-03-08: 2 mg via INTRAVENOUS
  Administered 2021-03-10: 4 mg via INTRAVENOUS
  Administered 2021-03-11: 2 mg via INTRAVENOUS
  Filled 2021-03-07: qty 2
  Filled 2021-03-07 (×2): qty 1

## 2021-03-07 MED ORDER — OXYCODONE-ACETAMINOPHEN 5-325 MG PO TABS
1.0000 | ORAL_TABLET | ORAL | Status: DC | PRN
  Administered 2021-03-09 – 2021-03-12 (×7): 2 via ORAL
  Filled 2021-03-07 (×9): qty 2

## 2021-03-07 MED ORDER — CHLORHEXIDINE GLUCONATE 0.12 % MT SOLN
OROMUCOSAL | Status: AC
  Administered 2021-03-07: 15 mL via OROMUCOSAL
  Filled 2021-03-07: qty 15

## 2021-03-07 MED ORDER — ACETAMINOPHEN 325 MG PO TABS
325.0000 mg | ORAL_TABLET | ORAL | Status: DC | PRN
  Administered 2021-03-07: 650 mg via ORAL
  Filled 2021-03-07: qty 2

## 2021-03-07 MED ORDER — FENTANYL CITRATE (PF) 250 MCG/5ML IJ SOLN
INTRAMUSCULAR | Status: AC
  Filled 2021-03-07: qty 5

## 2021-03-07 MED ORDER — SODIUM CHLORIDE 0.9 % IV SOLN
250.0000 mL | INTRAVENOUS | Status: DC | PRN
  Administered 2021-03-08: 250 mL via INTRAVENOUS

## 2021-03-07 MED ORDER — CHLORHEXIDINE GLUCONATE 0.12 % MT SOLN: 15.0000 mL | Freq: Once | OROMUCOSAL | Status: AC

## 2021-03-07 MED ORDER — POTASSIUM CHLORIDE CRYS ER 20 MEQ PO TBCR
20.0000 meq | EXTENDED_RELEASE_TABLET | Freq: Once | ORAL | Status: DC
  Filled 2021-03-07: qty 1

## 2021-03-07 MED ORDER — OXYCODONE HCL 5 MG PO TABS: 5.0000 mg | ORAL_TABLET | ORAL | Status: DC | PRN

## 2021-03-07 MED ORDER — SENNOSIDES-DOCUSATE SODIUM 8.6-50 MG PO TABS: 1.0000 | ORAL_TABLET | Freq: Every evening | ORAL | Status: DC | PRN

## 2021-03-07 MED ORDER — ALUM & MAG HYDROXIDE-SIMETH 200-200-20 MG/5ML PO SUSP: 15.0000 mL | ORAL | Status: DC | PRN

## 2021-03-07 MED ORDER — MIDAZOLAM HCL 2 MG/2ML IJ SOLN
INTRAMUSCULAR | Status: DC | PRN
  Administered 2021-03-07: 2 mg via INTRAVENOUS

## 2021-03-07 SURGICAL SUPPLY — 39 items
BAG COUNTER SPONGE SURGICOUNT (BAG) ×3 IMPLANT
BAG ISOLATION DRAPE 18X18 (DRAPES) IMPLANT
BNDG ELASTIC 4X5.8 VLCR STR LF (GAUZE/BANDAGES/DRESSINGS) IMPLANT
BNDG ELASTIC 6X5.8 VLCR STR LF (GAUZE/BANDAGES/DRESSINGS) IMPLANT
BNDG GAUZE ELAST 4 BULKY (GAUZE/BANDAGES/DRESSINGS) IMPLANT
CANISTER SUCT 3000ML PPV (MISCELLANEOUS) ×3 IMPLANT
CANISTER WOUNDNEG PRESSURE 500 (CANNISTER) ×1 IMPLANT
COVER SURGICAL LIGHT HANDLE (MISCELLANEOUS) ×6 IMPLANT
DERMABOND ADVANCED (GAUZE/BANDAGES/DRESSINGS)
DERMABOND ADVANCED .7 DNX12 (GAUZE/BANDAGES/DRESSINGS) ×2 IMPLANT
DRAPE ISOLATION BAG 18X18 (DRAPES) ×1
DRAPE ORTHO SPLIT 77X108 STRL (DRAPES) ×2
DRAPE SURG ORHT 6 SPLT 77X108 (DRAPES) IMPLANT
DRSG VAC ATS MED SENSATRAC (GAUZE/BANDAGES/DRESSINGS) ×1 IMPLANT
ELECT REM PT RETURN 9FT ADLT (ELECTROSURGICAL) ×3
ELECTRODE REM PT RTRN 9FT ADLT (ELECTROSURGICAL) ×2 IMPLANT
GAUZE SPONGE 4X4 12PLY STRL (GAUZE/BANDAGES/DRESSINGS) ×3 IMPLANT
GLOVE SRG 8 PF TXTR STRL LF DI (GLOVE) ×2 IMPLANT
GLOVE SURG ENC MOIS LTX SZ7.5 (GLOVE) ×3 IMPLANT
GLOVE SURG UNDER POLY LF SZ8 (GLOVE) ×1
GOWN STRL REUS W/ TWL LRG LVL3 (GOWN DISPOSABLE) ×4 IMPLANT
GOWN STRL REUS W/ TWL XL LVL3 (GOWN DISPOSABLE) ×4 IMPLANT
GOWN STRL REUS W/TWL LRG LVL3 (GOWN DISPOSABLE) ×2
GOWN STRL REUS W/TWL XL LVL3 (GOWN DISPOSABLE) ×2
KIT BASIN OR (CUSTOM PROCEDURE TRAY) ×3 IMPLANT
KIT TURNOVER KIT B (KITS) ×3 IMPLANT
NS IRRIG 1000ML POUR BTL (IV SOLUTION) ×3 IMPLANT
PACK CV ACCESS (CUSTOM PROCEDURE TRAY) ×2 IMPLANT
PACK GENERAL/GYN (CUSTOM PROCEDURE TRAY) ×1 IMPLANT
PAD ARMBOARD 7.5X6 YLW CONV (MISCELLANEOUS) ×6 IMPLANT
SUT ETHILON 3 0 PS 1 (SUTURE) IMPLANT
SUT MNCRL AB 4-0 PS2 18 (SUTURE) IMPLANT
SUT VIC AB 2-0 CT1 27 (SUTURE) ×2
SUT VIC AB 2-0 CT1 TAPERPNT 27 (SUTURE) IMPLANT
SUT VIC AB 3-0 SH 27 (SUTURE) ×2
SUT VIC AB 3-0 SH 27X BRD (SUTURE) IMPLANT
SWAB CULTURE ESWAB REG 1ML (MISCELLANEOUS) ×1 IMPLANT
TOWEL GREEN STERILE (TOWEL DISPOSABLE) ×3 IMPLANT
WATER STERILE IRR 1000ML POUR (IV SOLUTION) ×3 IMPLANT

## 2021-03-07 NOTE — ED Provider Notes (Signed)
[facility] EMERGENCY DEPARTMENT Provider Note   CSN: [id number] Arrival date & time: 03/06/21  2250     History Chief Complaint  Patient presents with   Wound Dehiscence    Sean [name] is a 50 y.o. male.  The history is provided by the patient.  Patient with extensive history including obesity, left AKA, hypertension presents with right leg pain.  Patient underwent thrombectomy of the right lower extremity earlier this month.  He reports over the past several days the wound has become more painful and draining.  He also reports that opened up.  No trauma.  No fevers or vomiting.  He reports his course is worsening.  No new weakness in his foot. Nothing improves the symptom    Past Medical History:  Diagnosis Date   Acquired deformity of right hand 11/27/2015   Formatting of this note might be different from the original. Secondary to injury/surgeries   AKA stump complication (HCC) 02/13/2020   Arthritis 05/06/2014   Carpal tunnel syndrome 05/06/2014   Chronic pain syndrome 02/13/2020   Class 2 severe obesity due to excess calories with serious comorbidity and body mass index (BMI) of 35.0 to 35.9 in adult [facility]) 12/10/2020   Depression    Dislocation of distal interphalangeal (DIP) joint of finger 05/06/2014   Encounter for immunization 12/05/2020   Erectile dysfunction due to diseases classified elsewhere 04/06/2020   Essential hypertension 11/27/2015   History of left below knee amputation (HCC) 05/16/2019   Hypertension    Impaired mobility and ADLs 05/16/2019   Ischemia of extremity 09/21/2018   Ischemia of left lower extremity 04/28/2019   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 Added automatically from request for surgery [phone]   Other long term (current) drug therapy 12/05/2020   PAD (peripheral artery disease) (HCC) 04/28/2019   Phantom limb  syndrome with pain (HCC) 09/12/2019   Formatting of this note might be different from the original. 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   Tobacco use disorder 12/10/2020   Weak urinary stream 12/10/2020    Patient Active Problem List   Diagnosis Date Noted   Peripheral arterial disease (HCC) 02/26/2021   Vitamin B12 deficiency (non anemic) 01/21/2021   Dyslipidemia 01/15/2021   Hypertension    Class 2 severe obesity due to excess calories with serious comorbidity and body mass index (BMI) of 35.0 to 35.9 in adult [facility]) 12/10/2020   Tobacco use disorder 12/10/2020   Urinary urgency 12/10/2020   Weak urinary stream 12/10/2020   Preop cardiovascular exam 12/05/2020   Other long term (current) drug therapy 12/05/2020   Tinea cruris 12/05/2020   Erectile dysfunction due to diseases classified elsewhere 04/06/2020   AKA stump complication (HCC) 02/13/2020   Chronic pain syndrome 02/13/2020   Neuropathic pain 11/10/2019   Phantom limb syndrome with pain (HCC) 09/12/2019   History of left below knee amputation (HCC) 05/16/2019   Impaired mobility and ADLs 05/16/2019   Ischemia of left lower extremity 04/28/2019   PAD (peripheral artery disease) (HCC) 04/28/2019   Ischemia of extremity 09/21/2018   PVD (peripheral vascular disease) (HCC) 09/20/2018   Acquired deformity of right hand 11/27/2015   Essential hypertension 11/27/2015   Vitamin D deficiency 11/27/2015   Arthritis 05/06/2014   Carpal tunnel syndrome 05/06/2014   Dislocation of  distal interphalangeal (DIP) joint of finger 05/06/2014   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    Past Surgical History:  Procedure Laterality Date   ABDOMINAL AORTOGRAM W/LOWER EXTREMITY N/A 05/08/2019   Procedure: ABDOMINAL AORTOGRAM W/LOWER EXTREMITY;  Surgeon: Maeola Harmanain, Brandon Christopher, MD;  [facility];  Service:  Cardiovascular;  Laterality: N/A;   ABDOMINAL AORTOGRAM W/LOWER EXTREMITY N/A 12/23/2020   Procedure: ABDOMINAL AORTOGRAM W/LOWER EXTREMITY;  Surgeon: Maeola Harmanain, Brandon Christopher, MD;  [facility];  Service: Cardiovascular;  Laterality: N/A;   ABDOMINAL AORTOGRAM W/LOWER EXTREMITY N/A 02/28/2021   Procedure: ABDOMINAL AORTOGRAM W/LOWER EXTREMITY;  Surgeon: Leonie DouglasHawken, Thomas N, MD;  Location: [facility] INVASIVE CV LAB;  Service: Cardiovascular;  Laterality: N/A;   AMPUTATION Left 01/30/2020   Procedure: LEFT ABOVE KNEE AMPUTATION;  Surgeon: Chuck Hintickson, Christopher S, MD;  [facility];  Service: Vascular;  Laterality: Left;   ANGIOPLASTY Left 04/27/2019   Procedure: Balloon Angioplasty of left posterior tibial artery;  Surgeon: Maeola Harmanain, Brandon Christopher, MD;  [facility];  Service: Vascular;  Laterality: Left;   EMBOLECTOMY Left 09/21/2018   Procedure: THROMBECTOMY LEFT POPLITEAL ARTERY BYPASS LEFT POPLITEAL TO TIBIAL ARTERY WITH SAPHENOUS VEIN HARVEST - FASCIOTOMY;  Surgeon: Chuck Hintickson, Christopher S, MD;  [facility];  Service: Vascular;  Laterality: Left;   FEMORAL-POPLITEAL BYPASS GRAFT Right 01/23/2021   Procedure: RIGHT LOWER EXTREMITY FEMORAL-POPLITEAL ARTERY BYPASS;  Surgeon: Maeola Harmanain, Brandon Christopher, MD;  [facility];  Service: Vascular;  Laterality: Right;   finger surgery Right    small finger  (2 different surgeries)   LOWER EXTREMITY ANGIOGRAM Left 04/27/2019   Procedure: LEFT LOWER EXTREMITY ANGIOGRAM;  Surgeon: Maeola Harmanain, Brandon Christopher, MD;  [facility];  Service: Vascular;  Laterality: Left;   PERIPHERAL VASCULAR BALLOON ANGIOPLASTY Left 05/08/2019   Procedure: PERIPHERAL VASCULAR BALLOON ANGIOPLASTY;  Surgeon: Maeola Harmanain, Brandon Christopher, MD;  [facility];  Service: Cardiovascular;  Laterality: Left;  Posterior tibial   PERIPHERAL VASCULAR BALLOON ANGIOPLASTY  02/28/2021   Procedure: PERIPHERAL VASCULAR BALLOON ANGIOPLASTY;  Surgeon: Leonie DouglasHawken, Thomas N, MD;   [facility]: [facility] INV[facility] CV LAB;  Service: Cardiovascular;;   PERIPHERAL VASCULAR INTERVENTION Left 04/28/2019   Procedure: PERIPHERAL VASCULAR INTERVENTION;  Surgeon: Cephus Shellinglark, Christopher J, MD;  Location: [facility] INVASIVE CV LAB;  Service: Cardiovascular;  Laterality: Left;   PERIPHERAL VASCULAR THROMBECTOMY Bilateral 04/28/2019   Procedure: PERIPHERAL VASCULAR THROMBECTOMY;  Surgeon: Cephus Shellinglark, Christopher J, MD;  Location: [facility] INVAS[facility] CV LAB;  Service: Cardiovascular;  Laterality: Bilateral;   PERIPHERAL VASCULAR THROMBECTOMY Left 05/08/2019   Procedure: PERIPHERAL VASCULAR THROMBECTOMY;  Surgeon: Maeola Harmanain, Brandon Christopher, MD;  [facility];  Service: Cardiovascular;  Laterality: Left;  posterior tibial   THROMBECTOMY OF BYPASS GRAFT FEMORAL- TIBIAL ARTERY Right 02/26/2021   Procedure: RIGHT LOWER EXTREMITY  THROMBECTOMY;  Surgeon: Nada LibmanBrabham, Vance W, MD;  Location: [facility] OR;  Service: Vascular;  Laterality: Right;   TRANSMETATARSAL AMPUTATION Left 05/09/2019   Procedure: Left Below Knee Amputation;  Surgeon: Maeola Harmanain, Brandon Christopher, MD;  [facility];  Service: Vascular;  Laterality: Left;       Family History  Problem Relation Age of Onset   Diabetes Father     Social History   Tobacco Use   Smoking status: Every Day    Packs/day: 1.00    Types: Cigarettes   Smokeless tobacco: Never  Vaping Use   Vaping Use: Never used  Substance Use Topics   Alcohol use: Never   Drug use:  Never    Home Medications Prior to Admission medications   Medication Sig Start Date End Date Taking? Authorizing Provider  Adalimumab (HUMIRA PEN) 40 MG/0.4ML PNKT Inject 40 mg into the skin every 14 (fourteen) days.    [provider]  amLODipine (NORVASC) 10 MG tablet Take 10 mg by mouth in the morning. 11/23/18   [provider]  aspirin EC 81 MG tablet Take 81 mg by mouth in the morning.    [provider]  bisacodyl (DULCOLAX) 5 MG EC tablet Take 5 mg by mouth daily as needed  for moderate constipation.    [provider]  Buprenorphine HCl (BELBUCA) 300 MCG FILM Place 300 mcg inside cheek in the morning and at bedtime. 02/13/20   [provider]  clobetasol ointment (TEMOVATE) 0.05 % Apply 1 application topically 2 (two) times daily as needed (skin irritation). 06/02/20   [provider]  clopidogrel (PLAVIX) 75 MG tablet Take 1 tablet (75 mg total) by mouth daily with breakfast. 04/30/19   Emilie Rutter, PA-C  cyanocobalamin (,VITAMIN B-12,) 1000 MCG/ML injection Inject 1,000 mcg into the muscle once a week.    [provider]  DULoxetine (CYMBALTA) 30 MG capsule Take 30 mg by mouth in the morning. 01/09/20   [provider]  hydrALAZINE (APRESOLINE) 10 MG tablet Take 10 mg by mouth 2 (two) times daily. 12/20/20   [provider]  olmesartan-hydrochlorothiazide (BENICAR HCT) 40-25 MG tablet Take 1 tablet by mouth daily. 12/18/20   [provider]  oxyCODONE-acetaminophen (PERCOCET/ROXICET) 5-325 MG tablet Take 1-2 tablets by mouth every 4 (four) hours as needed for moderate pain. 03/02/21   Leonie Douglas, MD  potassium chloride SA (KLOR-CON) 20 MEQ tablet Take 20 mEq by mouth daily.    [provider]  pregabalin (LYRICA) 300 MG capsule Take 300 mg by mouth daily.    [provider]  rosuvastatin (CRESTOR) 10 MG tablet Take 10 mg by mouth daily.    [provider]  Testosterone 20.25 MG/ACT (1.62%) GEL Apply 40.5 mg topically daily. Rub on shoulder 12/27/20   [provider]  Vitamin D, Ergocalciferol, (DRISDOL) 1.25 MG (50000 UNIT) CAPS capsule Take 50,000 Units by mouth every Thursday. 12/11/20   [provider]  zinc gluconate 50 MG tablet Take 50 mg by mouth daily.    [provider]    Allergies    Patient has no known allergies.  Review of Systems   Review of Systems  Constitutional:  Negative for chills and fever.  Respiratory:  Negative for  shortness of breath.   Cardiovascular:  Negative for chest pain.  Gastrointestinal:  Negative for vomiting.  Skin:  Positive for wound.  Neurological:  Negative for weakness.  All other systems reviewed and are negative.  Physical Exam Updated Vital Signs BP 112/74   Pulse 86   Temp (!) 97.4 F (36.3 C) (Oral)   Resp 19   SpO2 100%   Physical Exam CONSTITUTIONAL: Well developed/well nourished HEAD: Normocephalic/atraumatic EYES: EOMI/PERRL ENMT: Mucous membranes moist NECK: supple no meningeal signs SPINE/BACK:entire spine nontender CV: S1/S2 noted, no murmurs/rubs/gallops noted LUNGS: Lungs are clear to auscultation bilaterally, no apparent distress ABDOMEN: soft, nontender NEURO: Pt is awake/alert/appropriate, moves all extremitiesx4.  No facial droop.   EXTREMITIES: Previous left AKA noted.  Right lower extremity reveals wound that appears to be dehisced.  There is some blood and pus noted draining from the wound.  Distal pulses are intact.  See photo  SKIN: warm,  see photo PSYCH: no abnormalities of mood noted, alert and oriented to situation   Patient gave verbal permission to utilize photo for medical documentation only The image was not stored on any personal device  ED Results / Procedures / Treatments   Labs (all labs ordered are listed, but only abnormal results are displayed) Labs Reviewed  COMPREHENSIVE METABOLIC PANEL - Abnormal; Notable for the following components:      Result Value   Glucose, Bld 161 (*)    Calcium 8.7 (*)    Total Protein 6.3 (*)    Albumin 3.2 (*)    AST 14 (*)    All other components within normal limits  CBC WITH DIFFERENTIAL/PLATELET - Abnormal; Notable for the following components:   WBC 14.5 (*)    RBC 3.34 (*)    Hemoglobin 8.7 (*)    HCT 27.4 (*)    RDW 15.7 (*)    Platelets 413 (*)    Neutro Abs 8.6 (*)    Lymphs Abs 4.6 (*)    Abs Immature Granulocytes 0.09 (*)    All other components within normal limits  RESP  PANEL BY RT-PCR (FLU A&B, COVID) [id number]    EKG None  Radiology No results found.  Procedures Procedures   Medications Ordered in ED Medications  piperacillin-tazobactam (ZOSYN) IVPB 3.375 g (has no administration in time range)  vancomycin (VANCOREADY) IVPB 2000 mg/400 mL (has no administration in time range)  fentaNYL (SUBLIMAZE) injection 100 mcg (100 mcg Intravenous Given 03/07/21 0055)    ED Course  I have reviewed the triage vital signs and the nursing notes.  Pertinent labs  results that were available during my care of the patient were reviewed by me and considered in my medical decision making (see chart for details).    MDM Rules/Calculators/A&P                          12:49 AM Patient underwent thrombectomy on July 6 now having pain and drainage from the wound and appears to have wound dehiscence Consult vascular surgery 3:16 AM D/w dr Butch Penny He reviewed chart/photos He recommends hospitalist admit Start broad-spectrum antibiotics. Discussed with Dr. Antionette Char for admission. Final Clinical Impression(s) / ED Diagnoses Final diagnoses:  Wound dehiscence  Postoperative infection, unspecified type, initial encounter    Rx / DC Orders ED Discharge Orders     None        Zadie Rhine, MD 03/07/21 [phone]

## 2021-03-07 NOTE — H&P (View-Only) (Signed)
[facility] [place]  ASSESSMENT / PLAN: 49 y.o. male with atherosclerosis of native arteries of right lower extremity. Status post thrombectomy of right superficial femoral to tibioperoneal trunk bypass 02/27/21 and posterior tibial angioplasty 03/01/21.  He now has superficial infection of his calf incision.  We will plan to go to the OR today for washout and VAC placement.  Continue empiric antibiotics.  Will obtain intraoperative cultures.  Really appreciate internal medicine assistance.   CHIEF COMPLAINT: Right calf infection after recent vascular surgery  HISTORY OF PRESENT ILLNESS: Sean [name] is a 49 y.o. male  status post right superficial femoral artery to tibioperoneal trunk bypass with ipsilateral greater saphenous vein 01/23/21.  He had thrombosis of this bypass graft around about 02/22/2021.  He presented to care 02/27/2021 and underwent thrombectomy on the same day by my partner Dr. Brabham.  I performed an angiogram for him 03/01/2021 to evaluate a potential cause for thrombosis.  Outflow stenosis identified and treated with angioplasty.  He now returns to the ER reporting purulent drainage and pain from his calf incision.  His wound is superficially dehisced.  Past Medical History:  Diagnosis Date   Acquired deformity of right hand 11/27/2015   Formatting of this note might be different from the original. Secondary to injury/surgeries   AKA stump complication (HCC) 02/13/2020   Arthritis 05/06/2014   Carpal tunnel syndrome 05/06/2014   Chronic pain syndrome 02/13/2020   Class 2 severe obesity due to excess calories with serious comorbidity and body mass index (BMI) of 35.0 to 35.9 in adult (HCC) 12/10/2020   Depression    Dislocation of distal interphalangeal (DIP) joint of finger 05/06/2014   Encounter for immunization 12/05/2020   Erectile dysfunction due to diseases classified elsewhere 04/06/2020   Essential hypertension 11/27/2015   History of left below knee  amputation (HCC) 05/16/2019   Hypertension    Impaired mobility and ADLs 05/16/2019   Ischemia of extremity 09/21/2018   Ischemia of left lower extremity 04/28/2019   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 Added automatically from request for surgery [id number]   Other long term (current) drug therapy 12/05/2020   PAD (peripheral artery disease) (HCC) 04/28/2019   Phantom limb syndrome with pain (HCC) 09/12/2019   Formatting of this note might be different from the original. 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   Tobacco use disorder 12/10/2020   Weak urinary stream 12/10/2020    Past Surgical History:  Procedure Laterality Date   ABDOMINAL AORTOGRAM W/LOWER EXTREMITY N/A 05/08/2019   Procedure: ABDOMINAL AORTOGRAM W/LOWER EXTREMITY;  Surgeon: Cain, Brandon Christopher, MD;  Location: [facility];  Service: Cardiovascular;  Laterality: N/A;   ABDOMINAL AORTOGRAM W/LOWER EXTREMITY N/A 12/23/2020   Procedure: ABDOMINAL AORTOGRAM W/LOWER EXTREMITY;  Surgeon: Cain, Brandon Christopher, MD;  Location: [facility];  Service: Cardiovascular;  Laterality: N/A;   ABDOMINAL AORTOGRAM W/LOWER EXTREMITY N/A 02/28/2021   Procedure: ABDOMINAL AORTOGRAM W/LOWER EXTREMITY;  Surgeon: Caeson Filippi N, MD;  Location: [facility];  Service: Cardiovascular;  Laterality: N/A;   AMPUTATION Left 01/30/2020   Procedure: LEFT ABOVE KNEE AMPUTATION;  Surgeon: Dickson, Christopher S, MD;  Location: [facility] OR;  Service: Vascular;  Laterality: Left;   ANGIOPLASTY Left 04/27/2019   Procedure: Balloon Angioplasty of left posterior tibial artery;    Surgeon: Maeola Harman, MD;  Location: [facility] OR;  Service: Vascular;  Laterality: Left;   EMBOLECTOMY Left 09/21/2018   Procedure: THROMBECTOMY LEFT POPLITEAL ARTERY  BYPASS LEFT POPLITEAL TO TIBIAL ARTERY WITH SAPHENOUS VEIN HARVEST - FASCIOTOMY;  Surgeon: Chuck Hint, MD;  Location: [facility] OR;  Service: Vascular;  Laterality: Left;   FEMORAL-POPLITEAL BYPASS GRAFT Right 01/23/2021   Procedure: RIGHT LOWER EXTREMITY FEMORAL-POPLITEAL ARTERY BYPASS;  Surgeon: Maeola Harman, MD;  Location: [facility] OR;  Service: Vascular;  Laterality: Right;   finger surgery Right    small finger  (2 different surgeries)   LOWER EXTREMITY ANGIOGRAM Left 04/27/2019   Procedure: LEFT LOWER EXTREMITY ANGIOGRAM;  Surgeon: Maeola Harman, MD;  Location: [facility] OR;  Service: Vascular;  Laterality: Left;   PERIPHERAL VASCULAR BALLOON ANGIOPLASTY Left 05/08/2019   Procedure: PERIPHERAL VASCULAR BALLOON ANGIOPLASTY;  Surgeon: Maeola Harman, MD;  Location: [facility];  Service: Cardiovascular;  Laterality: Left;  Posterior tibial   PERIPHERAL VASCULAR BALLOON ANGIOPLASTY  02/28/2021   Procedure: PERIPHERAL VASCULAR BALLOON ANGIOPLASTY;  Surgeon: Leonie Douglas, MD;  Location: [facility];  Service: Cardiovascular;;   PERIPHERAL VASCULAR INTERVENTION Left 04/28/2019   Procedure: PERIPHERAL VASCULAR INTERVENTION;  Surgeon: Cephus Shelling, MD;  Location: [facility];  Service: Cardiovascular;  Laterality: Left;   PERIPHERAL VASCULAR THROMBECTOMY Bilateral 04/28/2019   Procedure: PERIPHERAL VASCULAR THROMBECTOMY;  Surgeon: Cephus Shelling, MD;  Location: [facility];  Service: Cardiovascular;  Laterality: Bilateral;   PERIPHERAL VASCULAR THROMBECTOMY Left 05/08/2019   Procedure: PERIPHERAL VASCULAR THROMBECTOMY;  Surgeon: Maeola Harman, MD;  Location: [facility] INVASIVE CV LAB;  Service: Cardiovascular;  Laterality: Left;  posterior tibial   THROMBECTOMY OF BYPASS GRAFT FEMORAL- TIBIAL ARTERY Right 02/26/2021   Procedure: RIGHT LOWER EXTREMITY  THROMBECTOMY;  Surgeon: Nada Libman, MD;  Location: [facility] OR;  Service: Vascular;   Laterality: Right;   TRANSMETATARSAL AMPUTATION Left 05/09/2019   Procedure: Left Below Knee Amputation;  Surgeon: Maeola Harman, MD;  Location: [facility] OR;  Service: Vascular;  Laterality: Left;    Family History  Problem Relation Age of Onset   Diabetes Father     Social History   Socioeconomic History   Marital status: Married    Spouse name: Not on file   Number of children: Not on file   Years of education: Not on file   Highest education level: Not on file  Occupational History   Not on file  Tobacco Use   Smoking status: Every Day    Packs/day: 1.00    Types: Cigarettes   Smokeless tobacco: Never  Vaping Use   Vaping Use: Never used  Substance and Sexual Activity   Alcohol use: Never   Drug use: Never   Sexual activity: Not on file  Other Topics Concern   Not on file  Social History Narrative   Not on file   Social Determinants of Health   Financial Resource Strain: Not on file  Food Insecurity: Not on file  Transportation Needs: Not on file  Physical Activity: Not on file  Stress: Not on file  Social Connections: Not on file  Intimate Partner Violence: Not on file    No Known Allergies  Current Facility-Administered Medications  Medication Dose Route Frequency Provider Last Rate Last Admin   acetaminophen (TYLENOL) tablet 650 mg  650 mg Oral Q6H PRN Opyd, Lavone Neri, MD       Or   acetaminophen (TYLENOL)  suppository 650 mg  650 mg Rectal Q6H PRN Opyd, Lavone Neri, MD       aspirin EC tablet 81 mg  81 mg Oral Daily Opyd, Lavone Neri, MD       bisacodyl (DULCOLAX) EC tablet 5 mg  5 mg Oral Daily PRN Opyd, Lavone Neri, MD       Buprenorphine HCl FILM 300 mcg  300 mcg Buccal BID Opyd, Lavone Neri, MD       ceFEPIme (MAXIPIME) 2 g in sodium chloride 0.9 % 100 mL IVPB  2 g Intravenous Q8H Ledford, James L, RPH       clobetasol ointment (TEMOVATE) 0.05 % 1 application  1 application Topical BID PRN Opyd, Lavone Neri, MD       clopidogrel (PLAVIX) tablet 75 mg   75 mg Oral Q breakfast Opyd, Lavone Neri, MD       DULoxetine (CYMBALTA) DR capsule 30 mg  30 mg Oral Daily Opyd, Lavone Neri, MD       enoxaparin (LOVENOX) injection 75 mg  75 mg Subcutaneous Q24H Opyd, Lavone Neri, MD       lactated ringers infusion   Intravenous Continuous Opyd, Lavone Neri, MD 85 mL/hr at 03/07/21 0620 New Bag at 03/07/21 0620   nicotine (NICODERM CQ - dosed in mg/24 hours) patch 21 mg  21 mg Transdermal Daily Opyd, Lavone Neri, MD       ondansetron (ZOFRAN) tablet 4 mg  4 mg Oral Q6H PRN Opyd, Lavone Neri, MD       Or   ondansetron (ZOFRAN) injection 4 mg  4 mg Intravenous Q6H PRN Opyd, Lavone Neri, MD       oxyCODONE-acetaminophen (PERCOCET/ROXICET) 5-325 MG per tablet 1-2 tablet  1-2 tablet Oral Q4H PRN Opyd, Lavone Neri, MD       pregabalin (LYRICA) capsule 300 mg  300 mg Oral Daily Opyd, Lavone Neri, MD       rosuvastatin (CRESTOR) tablet 10 mg  10 mg Oral Daily Opyd, Lavone Neri, MD       senna-docusate (Senokot-S) tablet 1 tablet  1 tablet Oral QHS PRN Opyd, Lavone Neri, MD       vancomycin (VANCOREADY) IVPB 1750 mg/350 mL  1,750 mg Intravenous Q12H Opyd, Lavone Neri, MD       Current Outpatient Medications  Medication Sig Dispense Refill   Adalimumab (HUMIRA PEN) 40 MG/0.4ML PNKT Inject 40 mg into the skin every 14 (fourteen) days.     amLODipine (NORVASC) 10 MG tablet Take 10 mg by mouth in the morning.     aspirin EC 81 MG tablet Take 81 mg by mouth in the morning.     bisacodyl (DULCOLAX) 5 MG EC tablet Take 5 mg by mouth daily as needed for moderate constipation.     Buprenorphine HCl (BELBUCA) 300 MCG FILM Place 300 mcg inside cheek in the morning and at bedtime.     clobetasol ointment (TEMOVATE) 0.05 % Apply 1 application topically 2 (two) times daily as needed (skin irritation).     clopidogrel (PLAVIX) 75 MG tablet Take 1 tablet (75 mg total) by mouth daily with breakfast. 30 tablet 2   cyanocobalamin (,VITAMIN B-12,) 1000 MCG/ML injection Inject 1,000 mcg into the muscle once a  week.     DULoxetine (CYMBALTA) 30 MG capsule Take 30 mg by mouth in the morning.     hydrALAZINE (APRESOLINE) 10 MG tablet Take 10 mg by mouth 2 (two) times daily.     olmesartan-hydrochlorothiazide (BENICAR HCT)  40-25 MG tablet Take 1 tablet by mouth daily.     oxyCODONE-acetaminophen (PERCOCET/ROXICET) 5-325 MG tablet Take 1-2 tablets by mouth every 4 (four) hours as needed for moderate pain. 30 tablet 0   potassium chloride SA (KLOR-CON) 20 MEQ tablet Take 20 mEq by mouth daily.     pregabalin (LYRICA) 300 MG capsule Take 300 mg by mouth daily.     rosuvastatin (CRESTOR) 10 MG tablet Take 10 mg by mouth daily.     Testosterone 20.25 MG/ACT (1.62%) GEL Apply 40.5 mg topically daily. Rub on shoulder     Vitamin D, Ergocalciferol, (DRISDOL) 1.25 MG (50000 UNIT) CAPS capsule Take 50,000 Units by mouth every Thursday.     zinc gluconate 50 MG tablet Take 50 mg by mouth daily.      REVIEW OF SYSTEMS:  [X]  denotes positive finding, [ ]  denotes negative finding Cardiac  Comments:  Chest pain or chest pressure:    Shortness of breath upon exertion:    Short of breath when lying flat:    Irregular heart rhythm:        Vascular    Pain in calf, thigh, or hip brought on by ambulation:    Pain in feet at night that wakes you up from your sleep:     Blood clot in your veins:    Leg swelling:         Pulmonary    Oxygen at home:    Productive cough:     Wheezing:         Neurologic    Sudden weakness in arms or legs:     Sudden numbness in arms or legs:     Sudden onset of difficulty speaking or slurred speech:    Temporary loss of vision in one eye:     Problems with dizziness:         Gastrointestinal    Blood in stool:     Vomited blood:         Genitourinary    Burning when urinating:     Blood in urine:        Psychiatric    Major depression:         Hematologic    Bleeding problems:    Problems with blood clotting too easily:        Skin    Rashes or ulcers:         Constitutional    Fever or chills:      PHYSICAL EXAM Vitals:   03/07/21 0500 03/07/21 0515 03/07/21 0730 03/07/21 0844  BP: 114/65 107/78 118/76   Pulse: 75 85 71   Resp: 14 18 14    Temp:    98 F (36.7 C)  TempSrc:    Oral  SpO2: 97% 98% 96%     Constitutional: well appearing. no distress. Appears well nourished.  Neurologic: CN intact. no focal findings. no sensory loss. Psychiatric:  Mood and affect symmetric and appropriate. Eyes:  No icterus. No conjunctival pallor. Ears, nose, throat:  mucous membranes moist. Midline trachea.  Cardiac: regular rate and rhythm.  Respiratory:  unlabored. Abdominal:  soft, non-tender, non-distended.  Peripheral vascular: right foot warm and well perfused.  Extremity: no edema. no cyanosis. no pallor.  Skin: no gangrene. no ulceration.    Lymphatic: no Stemmer's sign. no palpable lymphadenopathy.  PERTINENT LABORATORY AND RADIOLOGIC DATA  Most recent CBC CBC Latest Ref Rng & Units 03/06/2021 03/02/2021 03/01/2021  WBC 4.0 - 10.5 K/uL 14.5(H) 9.1  10.8(H)  Hemoglobin 13.0 - 17.0 g/dL 0.1(S8.7(L) 7.8(L) 7.2(L)  Hematocrit 39.0 - 52.0 % 27.4(L) 23.7(L) 22.5(L)  Platelets 150 - 400 K/uL 413(H) 198 180     Most recent CMP CMP Latest Ref Rng & Units 03/06/2021 02/28/2021 02/27/2021  Glucose 70 - 99 mg/dL 010(X161(H) - 323(F226(H)  BUN 6 - 20 mg/dL 11 - 9  Creatinine 5.730.61 - 1.24 mg/dL 2.201.04 2.541.05 2.701.14  Sodium 135 - 145 mmol/L 135 - 138  Potassium 3.5 - 5.1 mmol/L 3.8 - 3.7  Chloride 98 - 111 mmol/L 100 - 107  CO2 22 - 32 mmol/L 26 - 23  Calcium 8.9 - 10.3 mg/dL 6.2(B8.7(L) - 8.6(L)  Total Protein 6.5 - 8.1 g/dL 6.3(L) - -  Total Bilirubin 0.3 - 1.2 mg/dL 0.6 - -  Alkaline Phos 38 - 126 U/L 60 - -  AST 15 - 41 U/L 14(L) - -  ALT 0 - 44 U/L 18 - -    Renal function Estimated Creatinine Clearance: 135.9 mL/min (by C-G formula based on SCr of 1.04 mg/dL).  Hgb A1c MFr Bld (%)  Date Value  02/01/2020 6.5 (H)    LDL Cholesterol  Date Value Ref Range Status   02/27/2021 35 0 - 99 mg/dL Final    Comment:           Total Cholesterol/HDL:CHD Risk Coronary Heart Disease Risk Table                     Men   Women  1/2 Average Risk   3.4   3.3  Average Risk       5.0   4.4  2 X Average Risk   9.6   7.1  3 X Average Risk  23.4   11.0        Use the calculated Patient Ratio above and the CHD Risk Table to determine the patient's CHD Risk.        ATP III CLASSIFICATION (LDL):  <100     mg/dL   Optimal  [id number]  mg/dL   Near or Above                    Optimal  130-159  mg/dL   Borderline  [id number]  mg/dL   High  >073>190     mg/dL   Very High Performed at [facility], [address] [place], [place] [address]     Rande Brunthomas N. Lenell AntuHawken, MD [facility] Office Phone Number: [phone] 03/07/2021 9:59 AM

## 2021-03-07 NOTE — Anesthesia Preprocedure Evaluation (Signed)
Anesthesia Evaluation  Patient identified by MRN, date of birth, ID band Patient awake    Reviewed: Allergy & Precautions, NPO status , Patient's Chart, lab work & pertinent test results  Airway Mallampati: II  TM Distance: >3 FB Neck ROM: Full    Dental no notable dental hx.    Pulmonary neg pulmonary ROS, Current Smoker and Patient abstained from smoking.,    Pulmonary exam normal breath sounds clear to auscultation       Cardiovascular hypertension, + Peripheral Vascular Disease  Normal cardiovascular exam Rhythm:Regular Rate:Normal     Neuro/Psych negative neurological ROS  negative psych ROS   GI/Hepatic negative GI ROS, Neg liver ROS,   Endo/Other  obesity  Renal/GU negative Renal ROS  negative genitourinary   Musculoskeletal negative musculoskeletal ROS (+)   Abdominal   Peds negative pediatric ROS (+)  Hematology negative hematology ROS (+)   Anesthesia Other Findings   Reproductive/Obstetrics negative OB ROS                             Anesthesia Physical Anesthesia Plan  ASA: 3  Anesthesia Plan: General   Post-op Pain Management:    Induction: Intravenous  PONV Risk Score and Plan: 1 and Ondansetron and Treatment may vary due to age or medical condition  Airway Management Planned: LMA  Additional Equipment:   Intra-op Plan:   Post-operative Plan: Extubation in OR  Informed Consent: I have reviewed the patients History and Physical, chart, labs and discussed the procedure including the risks, benefits and alternatives for the proposed anesthesia with the patient or authorized representative who has indicated his/her understanding and acceptance.     Dental advisory given  Plan Discussed with: CRNA and Surgeon  Anesthesia Plan Comments:         Anesthesia Quick Evaluation

## 2021-03-07 NOTE — Op Note (Signed)
Procedure: Incision and drainage right leg placement of VAC dressing  Preoperative diagnosis: Poorly healing wound right leg  Postoperative diagnosis: Same  Anesthesia: General  Assistant: Nurse  Operative findings: Edematous muscle with turbid brownish fluid  Specimens: Intraoperative cultures  Operative details: After obtaining informed consent, patient was taken the operating.  The patient was placed in supine position operating table.  After induction general anesthesia patient's right lower extremities prepped and draped in usual sterile fashion.  Pre-existing nylon sutures and staples were removed.  There was some brownish turbid fluid in the popliteal space.  A culture was taken of this.  The wound was then thoroughly irrigated with normal saline solution.  The muscle was edematous but viable.  I was able to palpate the bypass graft which had a good pulse within it.  There was no obvious ongoing bleeding.  After the wound had been thoroughly irrigated a VAC sponge was placed over the wound and a VAC dressing placed with pressure to 125 mmHg.  The wound was approximately 10 x 5 cm and 3 cm in depth.  Patient tolerated procedure well and there were no complications.  The instrument sponge needle count was correct the end of the case.  Patient was taken to recovery room stable condition.  [name], MD [facility] [place] Office: [phone]

## 2021-03-07 NOTE — Consult Note (Signed)
[facility] [place]  ASSESSMENT / PLAN: 50 y.o. male with atherosclerosis of native arteries of right lower extremity. Status post thrombectomy of right superficial femoral to tibioperoneal trunk bypass 02/27/21 and posterior tibial angioplasty 03/01/21.  He now has superficial infection of his calf incision.  We will plan to go to the OR today for washout and VAC placement.  Continue empiric antibiotics.  Will obtain intraoperative cultures.  Really appreciate internal medicine assistance.   CHIEF COMPLAINT: Right calf infection after recent vascular surgery  HISTORY OF PRESENT ILLNESS: Sean [name] is a 50 y.o. male  status post right superficial femoral artery to tibioperoneal trunk bypass with ipsilateral greater saphenous vein 01/23/21.  He had thrombosis of this bypass graft around about 02/22/2021.  He presented to care 02/27/2021 and underwent thrombectomy on the same day by my partner Dr. Myra Gianotti.  I performed an angiogram for him 03/01/2021 to evaluate a potential cause for thrombosis.  Outflow stenosis identified and treated with angioplasty.  He now returns to the ER reporting purulent drainage and pain from his calf incision.  His wound is superficially dehisced.  Past Medical History:  Diagnosis Date   Acquired deformity of right hand 11/27/2015   Formatting of this note might be different from the original. Secondary to injury/surgeries   AKA stump complication (HCC) 02/13/2020   Arthritis 05/06/2014   Carpal tunnel syndrome 05/06/2014   Chronic pain syndrome 02/13/2020   Class 2 severe obesity due to excess calories with serious comorbidity and body mass index (BMI) of 35.0 to 35.9 in adult 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 Added automatically from request for surgery [id number]   Other long term (current) drug therapy 12/05/2020   PAD (peripheral artery disease) (HCC) 04/28/2019   Phantom limb syndrome with pain (HCC) 09/12/2019   Formatting of this note might be different from the original. 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   Tobacco use disorder 12/10/2020   Weak urinary stream 12/10/2020    Past Surgical History:  Procedure Laterality Date   ABDOMINAL AORTOGRAM W/LOWER EXTREMITY N/A 05/08/2019   Procedure: ABDOMINAL AORTOGRAM W/LOWER EXTREMITY;  Surgeon: Maeola Harman, MD;  Location: [facility] INVASIVE CV LAB;  Service: Cardiovascular;  Laterality: N/A;   ABDOMINAL AORTOGRAM W/LOWER EXTREMITY N/A 12/23/2020   Procedure: ABDOMINAL AORTOGRAM W/LOWER EXTREMITY;  Surgeon: Maeola Harman, MD;  Location: [facility] INVASIVE CV LAB;  Service: Cardiovascular;  Laterality: N/A;   ABDOMINAL AORTOGRAM W/LOWER EXTREMITY N/A 02/28/2021   Procedure: ABDOMINAL AORTOGRAM W/LOWER EXTREMITY;  Surgeon: Leonie Douglas, MD;  Location: [facility] INVASIVE CV LAB;  Service: Cardiovascular;  Laterality: N/A;   AMPUTATION Left 01/30/2020   Procedure: LEFT ABOVE KNEE AMPUTATION;  Surgeon: Chuck Hint, MD;  Location: [facility] OR;  Service: Vascular;  Laterality: Left;   ANGIOPLASTY Left 04/27/2019   Procedure: Balloon Angioplasty of left posterior tibial artery;  Surgeon: Maeola Harman, MD;  Location: [facility] OR;  Service: Vascular;  Laterality: Left;   EMBOLECTOMY Left 09/21/2018   Procedure: THROMBECTOMY LEFT POPLITEAL ARTERY  BYPASS LEFT POPLITEAL TO TIBIAL ARTERY WITH SAPHENOUS VEIN HARVEST - FASCIOTOMY;  Surgeon: Chuck Hint, MD;  Location: [facility] OR;  Service: Vascular;  Laterality: Left;   FEMORAL-POPLITEAL BYPASS GRAFT Right 01/23/2021   Procedure: RIGHT LOWER EXTREMITY FEMORAL-POPLITEAL ARTERY BYPASS;  Surgeon: Maeola Harman, MD;  Location: [facility] OR;  Service: Vascular;  Laterality: Right;   finger surgery Right    small finger  (2 different surgeries)   LOWER EXTREMITY ANGIOGRAM Left 04/27/2019   Procedure: LEFT LOWER EXTREMITY ANGIOGRAM;  Surgeon: Maeola Harman, MD;  Location: [facility] OR;  Service: Vascular;  Laterality: Left;   PERIPHERAL VASCULAR BALLOON ANGIOPLASTY Left 05/08/2019   Procedure: PERIPHERAL VASCULAR BALLOON ANGIOPLASTY;  Surgeon: Maeola Harman, MD;  Location: [facility] INVASIVE CV LAB;  Service: Cardiovascular;  Laterality: Left;  Posterior tibial   PERIPHERAL VASCULAR BALLOON ANGIOPLASTY  02/28/2021   Procedure: PERIPHERAL VASCULAR BALLOON ANGIOPLASTY;  Surgeon: Leonie Douglas, MD;  Location: [facility] INVASIVE CV LAB;  Service: Cardiovascular;;   PERIPHERAL VASCULAR INTERVENTION Left 04/28/2019   Procedure: PERIPHERAL VASCULAR INTERVENTION;  Surgeon: Cephus Shelling, MD;  Location: [facility] INVASIVE CV LAB;  Service: Cardiovascular;  Laterality: Left;   PERIPHERAL VASCULAR THROMBECTOMY Bilateral 04/28/2019   Procedure: PERIPHERAL VASCULAR THROMBECTOMY;  Surgeon: Cephus Shelling, MD;  Location: [facility] INVASIVE CV LAB;  Service: Cardiovascular;  Laterality: Bilateral;   PERIPHERAL VASCULAR THROMBECTOMY Left 05/08/2019   Procedure: PERIPHERAL VASCULAR THROMBECTOMY;  Surgeon: Maeola Harman, MD;  Location: [facility] INVASIVE CV LAB;  Service: Cardiovascular;  Laterality: Left;  posterior tibial   THROMBECTOMY OF BYPASS GRAFT FEMORAL- TIBIAL ARTERY Right 02/26/2021   Procedure: RIGHT LOWER EXTREMITY  THROMBECTOMY;  Surgeon: Nada Libman, MD;  Location: [facility] OR;  Service: Vascular;   Laterality: Right;   TRANSMETATARSAL AMPUTATION Left 05/09/2019   Procedure: Left Below Knee Amputation;  Surgeon: Maeola Harman, MD;  Location: [facility] OR;  Service: Vascular;  Laterality: Left;    Family History  Problem Relation Age of Onset   Diabetes Father     Social History   Socioeconomic History   Marital status: Married    Spouse name: Not on file   Number of children: Not on file   Years of education: Not on file   Highest education level: Not on file  Occupational History   Not on file  Tobacco Use   Smoking status: Every Day    Packs/day: 1.00    Types: Cigarettes   Smokeless tobacco: Never  Vaping Use   Vaping Use: Never used  Substance and Sexual Activity   Alcohol use: Never   Drug use: Never   Sexual activity: Not on file  Other Topics Concern   Not on file  Social History Narrative   Not on file   Social Determinants of Health   Financial Resource Strain: Not on file  Food Insecurity: Not on file  Transportation Needs: Not on file  Physical Activity: Not on file  Stress: Not on file  Social Connections: Not on file  Intimate Partner Violence: Not on file    No Known Allergies  Current Facility-Administered Medications  Medication Dose Route Frequency Provider Last Rate Last Admin   acetaminophen (TYLENOL) tablet 650 mg  650 mg Oral Q6H PRN Opyd, Lavone Neri, MD       Or   acetaminophen (TYLENOL)  suppository 650 mg  650 mg Rectal Q6H PRN Opyd, Lavone Neri, MD       aspirin EC tablet 81 mg  81 mg Oral Daily Opyd, Lavone Neri, MD       bisacodyl (DULCOLAX) EC tablet 5 mg  5 mg Oral Daily PRN Opyd, Lavone Neri, MD       Buprenorphine HCl FILM 300 mcg  300 mcg Buccal BID Opyd, Lavone Neri, MD       ceFEPIme (MAXIPIME) 2 g in sodium chloride 0.9 % 100 mL IVPB  2 g Intravenous Q8H Ledford, James L, RPH       clobetasol ointment (TEMOVATE) 0.05 % 1 application  1 application Topical BID PRN Opyd, Lavone Neri, MD       clopidogrel (PLAVIX) tablet 75 mg   75 mg Oral Q breakfast Opyd, Lavone Neri, MD       DULoxetine (CYMBALTA) DR capsule 30 mg  30 mg Oral Daily Opyd, Lavone Neri, MD       enoxaparin (LOVENOX) injection 75 mg  75 mg Subcutaneous Q24H Opyd, Lavone Neri, MD       lactated ringers infusion   Intravenous Continuous Opyd, Lavone Neri, MD 85 mL/hr at 03/07/21 0620 New Bag at 03/07/21 0620   nicotine (NICODERM CQ - dosed in mg/24 hours) patch 21 mg  21 mg Transdermal Daily Opyd, Lavone Neri, MD       ondansetron (ZOFRAN) tablet 4 mg  4 mg Oral Q6H PRN Opyd, Lavone Neri, MD       Or   ondansetron (ZOFRAN) injection 4 mg  4 mg Intravenous Q6H PRN Opyd, Lavone Neri, MD       oxyCODONE-acetaminophen (PERCOCET/ROXICET) 5-325 MG per tablet 1-2 tablet  1-2 tablet Oral Q4H PRN Opyd, Lavone Neri, MD       pregabalin (LYRICA) capsule 300 mg  300 mg Oral Daily Opyd, Lavone Neri, MD       rosuvastatin (CRESTOR) tablet 10 mg  10 mg Oral Daily Opyd, Lavone Neri, MD       senna-docusate (Senokot-S) tablet 1 tablet  1 tablet Oral QHS PRN Opyd, Lavone Neri, MD       vancomycin (VANCOREADY) IVPB 1750 mg/350 mL  1,750 mg Intravenous Q12H Opyd, Lavone Neri, MD       Current Outpatient Medications  Medication Sig Dispense Refill   Adalimumab (HUMIRA PEN) 40 MG/0.4ML PNKT Inject 40 mg into the skin every 14 (fourteen) days.     amLODipine (NORVASC) 10 MG tablet Take 10 mg by mouth in the morning.     aspirin EC 81 MG tablet Take 81 mg by mouth in the morning.     bisacodyl (DULCOLAX) 5 MG EC tablet Take 5 mg by mouth daily as needed for moderate constipation.     Buprenorphine HCl (BELBUCA) 300 MCG FILM Place 300 mcg inside cheek in the morning and at bedtime.     clobetasol ointment (TEMOVATE) 0.05 % Apply 1 application topically 2 (two) times daily as needed (skin irritation).     clopidogrel (PLAVIX) 75 MG tablet Take 1 tablet (75 mg total) by mouth daily with breakfast. 30 tablet 2   cyanocobalamin (,VITAMIN B-12,) 1000 MCG/ML injection Inject 1,000 mcg into the muscle once a  week.     DULoxetine (CYMBALTA) 30 MG capsule Take 30 mg by mouth in the morning.     hydrALAZINE (APRESOLINE) 10 MG tablet Take 10 mg by mouth 2 (two) times daily.     olmesartan-hydrochlorothiazide (BENICAR HCT)  40-25 MG tablet Take 1 tablet by mouth daily.     oxyCODONE-acetaminophen (PERCOCET/ROXICET) 5-325 MG tablet Take 1-2 tablets by mouth every 4 (four) hours as needed for moderate pain. 30 tablet 0   potassium chloride SA (KLOR-CON) 20 MEQ tablet Take 20 mEq by mouth daily.     pregabalin (LYRICA) 300 MG capsule Take 300 mg by mouth daily.     rosuvastatin (CRESTOR) 10 MG tablet Take 10 mg by mouth daily.     Testosterone 20.25 MG/ACT (1.62%) GEL Apply 40.5 mg topically daily. Rub on shoulder     Vitamin D, Ergocalciferol, (DRISDOL) 1.25 MG (50000 UNIT) CAPS capsule Take 50,000 Units by mouth every Thursday.     zinc gluconate 50 MG tablet Take 50 mg by mouth daily.      REVIEW OF SYSTEMS:  [X]  denotes positive finding, [ ]  denotes negative finding Cardiac  Comments:  Chest pain or chest pressure:    Shortness of breath upon exertion:    Short of breath when lying flat:    Irregular heart rhythm:        Vascular    Pain in calf, thigh, or hip brought on by ambulation:    Pain in feet at night that wakes you up from your sleep:     Blood clot in your veins:    Leg swelling:         Pulmonary    Oxygen at home:    Productive cough:     Wheezing:         Neurologic    Sudden weakness in arms or legs:     Sudden numbness in arms or legs:     Sudden onset of difficulty speaking or slurred speech:    Temporary loss of vision in one eye:     Problems with dizziness:         Gastrointestinal    Blood in stool:     Vomited blood:         Genitourinary    Burning when urinating:     Blood in urine:        Psychiatric    Major depression:         Hematologic    Bleeding problems:    Problems with blood clotting too easily:        Skin    Rashes or ulcers:         Constitutional    Fever or chills:      PHYSICAL EXAM Vitals:   03/07/21 0500 03/07/21 0515 03/07/21 0730 03/07/21 0844  BP: 114/65 107/78 118/76   Pulse: 75 85 71   Resp: 14 18 14    Temp:    98 F (36.7 C)  TempSrc:    Oral  SpO2: 97% 98% 96%     Constitutional: well appearing. no distress. Appears well nourished.  Neurologic: CN intact. no focal findings. no sensory loss. Psychiatric:  Mood and affect symmetric and appropriate. Eyes:  No icterus. No conjunctival pallor. Ears, nose, throat:  mucous membranes moist. Midline trachea.  Cardiac: regular rate and rhythm.  Respiratory:  unlabored. Abdominal:  soft, non-tender, non-distended.  Peripheral vascular: right foot warm and well perfused.  Extremity: no edema. no cyanosis. no pallor.  Skin: no gangrene. no ulceration.    Lymphatic: no Stemmer's sign. no palpable lymphadenopathy.  PERTINENT LABORATORY AND RADIOLOGIC DATA  Most recent CBC CBC Latest Ref Rng & Units 03/06/2021 03/02/2021 03/01/2021  WBC 4.0 - 10.5 K/uL 14.5(H) 9.1  10.8(H)  Hemoglobin 13.0 - 17.0 g/dL 0.1(S8.7(L) 7.8(L) 7.2(L)  Hematocrit 39.0 - 52.0 % 27.4(L) 23.7(L) 22.5(L)  Platelets 150 - 400 K/uL 413(H) 198 180     Most recent CMP CMP Latest Ref Rng & Units 03/06/2021 02/28/2021 02/27/2021  Glucose 70 - 99 mg/dL 010(X161(H) - 323(F226(H)  BUN 6 - 20 mg/dL 11 - 9  Creatinine 5.730.61 - 1.24 mg/dL 2.201.04 2.541.05 2.701.14  Sodium 135 - 145 mmol/L 135 - 138  Potassium 3.5 - 5.1 mmol/L 3.8 - 3.7  Chloride 98 - 111 mmol/L 100 - 107  CO2 22 - 32 mmol/L 26 - 23  Calcium 8.9 - 10.3 mg/dL 6.2(B8.7(L) - 8.6(L)  Total Protein 6.5 - 8.1 g/dL 6.3(L) - -  Total Bilirubin 0.3 - 1.2 mg/dL 0.6 - -  Alkaline Phos 38 - 126 U/L 60 - -  AST 15 - 41 U/L 14(L) - -  ALT 0 - 44 U/L 18 - -    Renal function Estimated Creatinine Clearance: 135.9 mL/min (by C-G formula based on SCr of 1.04 mg/dL).  Hgb A1c MFr Bld (%)  Date Value  02/01/2020 6.5 (H)    LDL Cholesterol  Date Value Ref Range Status   02/27/2021 35 0 - 99 mg/dL Final    Comment:           Total Cholesterol/HDL:CHD Risk Coronary Heart Disease Risk Table                     Men   Women  1/2 Average Risk   3.4   3.3  Average Risk       5.0   4.4  2 X Average Risk   9.6   7.1  3 X Average Risk  23.4   11.0        Use the calculated Patient Ratio above and the CHD Risk Table to determine the patient's CHD Risk.        ATP III CLASSIFICATION (LDL):  <100     mg/dL   Optimal  [id number]  mg/dL   Near or Above                    Optimal  130-159  mg/dL   Borderline  [id number]  mg/dL   High  >073>190     mg/dL   Very High Performed at [facility], [address] [place], [place] [address]     Rande Brunthomas N. Lenell AntuHawken, MD [facility] Office Phone Number: [phone] 03/07/2021 9:59 AM

## 2021-03-07 NOTE — Progress Notes (Signed)
Pt arrived on 4E from PACU. Connected to telemetry. A&Ox4. CHG bath done. VSS. Oriented to bed and room.  Brooke Pace, RN

## 2021-03-07 NOTE — Plan of Care (Signed)

## 2021-03-07 NOTE — Progress Notes (Signed)
PROGRESS NOTE    Sean PalmaClarence Sprunger  UEA:[id number] DOB: 09/02/1970 DOA: 03/06/2021 PCP: Virgilio Bellingsborne, Tirrany T, PA-C   Brief Narrative:  Sean [name] is a 50 y.o. male with medical history significant for psoriasis, hypertension, chronic pain, current smoker, and peripheral arterial disease with history of left AKA and recent right superficial femoral artery to tibioperoneal trunk bypass, now presenting to the emergency department for evaluation of wound dehiscence and purulent drainage. Vascular surgery was consulted by the ED physician and medical admission was recommended.  Patient was treated with vancomycin and Zosyn in the ED.  Assessment & Plan:   Principal Problem:   Wound infection Active Problems:   Chronic pain syndrome   Psoriasis   Hypertension   Peripheral arterial disease (HCC)   Ascending aortic aneurysm (HCC)   Right lower lobe consolidation (HCC)   Normocytic anemia   Sepsis secondary to right lower extremity infection/cellulitis, POA  -Patient underwent right superficial femoral to tibioperoneal trunk bypass 02/27/2021 with posterior tibial angioplasty 03/01/2021, presents with wound dehiscence and superficial infection -Vascular following, patient to the OR today for washout and wound VAC placement -Continue broad-spectrum antibiotics including cefepime, Zosyn, vancomycin   PAD, severe - Status-post left AKA June 2021 and right SFA to tibioperoneal trunk bypass 01/23/21 with bypass thrombectomy on 7/6 and outflow stenosis angioplasty 02/28/21  - Continue ASA, Plavix, and statin; continue smoking cessation efforts     Essential hypertension - BP low-normal in ED and antihypertensives held on admission     Acute on chronic normocytic anemia -Hemoglobin within normal limits earlier this year some 6 months ago  -Labs have been downtrending progressively since then, most notably down post procedure  -Follow morning labs, transfuse if hemoglobin less than 7 or symptomatic    Chronic RLL consolidation  - Noted incidentally on CT from 01/22/21  - No respiratory symptoms  - Planned for repeat imaging in 2 weeks -we will repeat if still in house   Ascending aortic aneurysm  - Continue CT surgery follow-up with biannual imaging; no further work-up warranted   Psoriasis  - Well-controlled on Humira, last injection 02/21/21  - Continue as-needed clobetasol     Chronic pain  - Continue home regimen with Lyrica, Cymbalta, buprenorphine      DVT prophylaxis: Lovenox  Code Status: Full  Family Communication: None present  Status is: Inpatient  Dispo: The patient is from: Home              Anticipated d/c is to: Same              Anticipated d/c date is: 48 to 72 hours              Patient currently not medically stable for discharge  Consultants:  Vascular surgery  Procedures:  Surgical wound washout and wound VAC placement 03/07/2021  Antimicrobials:  Vanco, Zosyn, cefepime  Subjective: No acute issues or events reported overnight  Objective: Vitals:   03/07/21 0445 03/07/21 0500 03/07/21 0515 03/07/21 0730  BP: 97/70 114/65 107/78 118/76  Pulse:  75 85 71  Resp: 15 14 18 14   Temp:      TempSrc:      SpO2:  97% 98% 96%    Intake/Output Summary (Last 24 hours) at 03/07/2021 0828 Last data filed at 03/07/2021 [id number] Gross per 24 hour  Intake 399.9 ml  Output --  Net 399.9 ml   There were no vitals filed for this visit.  Examination:  General:  Pleasantly resting in  bed, No acute distress. HEENT:  Normocephalic atraumatic.  Sclerae nonicteric, noninjected.  Extraocular movements intact bilaterally. Neck:  Without mass or deformity.  Trachea is midline. Lungs:  Clear to auscultate bilaterally without rhonchi, wheeze, or rales. Heart:  Regular rate and rhythm.  Without murmurs, rubs, or gallops. Abdomen:  Soft, nontender, nondistended.  Without guarding or rebound. Extremities: Without cyanosis, clubbing, edema, or obvious  deformity. Vascular:  Dorsalis pedis and posterior tibial pulses weak palpable bilaterally. Skin: See wound below    Data Reviewed: I have personally reviewed following labs and imaging studies  CBC: Recent Labs  Lab 02/28/21 1751 03/01/21 0057 03/02/21 0203 03/06/21 2320  WBC 11.5* 10.8* 9.1 14.5*  NEUTROABS  --   --   --  8.6*  HGB 7.7* 7.2* 7.8* 8.7*  HCT 23.8* 22.5* 23.7* 27.4*  MCV 82.4 83.0 82.9 82.0  PLT 172 180 198 413*   Basic Metabolic Panel: Recent Labs  Lab 02/28/21 1751 03/06/21 2320  NA  --  135  K  --  3.8  CL  --  100  CO2  --  26  GLUCOSE  --  161*  BUN  --  11  CREATININE 1.05 1.04  CALCIUM  --  8.7*   GFR: Estimated Creatinine Clearance: 135.9 mL/min (by C-G formula based on SCr of 1.04 mg/dL). Liver Function Tests: Recent Labs  Lab 03/06/21 2320  AST 14*  ALT 18  ALKPHOS 60  BILITOT 0.6  PROT 6.3*  ALBUMIN 3.2*   No results for input(s): LIPASE, AMYLASE in the last 168 hours. No results for input(s): AMMONIA in the last 168 hours. Coagulation Profile: No results for input(s): INR, PROTIME in the last 168 hours. Cardiac Enzymes: No results for input(s): CKTOTAL, CKMB, CKMBINDEX, TROPONINI in the last 168 hours. BNP (last 3 results) No results for input(s): PROBNP in the last 8760 hours. HbA1C: No results for input(s): HGBA1C in the last 72 hours. CBG: No results for input(s): GLUCAP in the last 168 hours. Lipid Profile: No results for input(s): CHOL, HDL, LDLCALC, TRIG, CHOLHDL, LDLDIRECT in the last 72 hours. Thyroid Function Tests: No results for input(s): TSH, T4TOTAL, FREET4, T3FREE, THYROIDAB in the last 72 hours. Anemia Panel: No results for input(s): VITAMINB12, FOLATE, FERRITIN, TIBC, IRON, RETICCTPCT in the last 72 hours. Sepsis Labs: No results for input(s): PROCALCITON, LATICACIDVEN in the last 168 hours.  Recent Results (from the past 240 hour(s))  Blood culture (routine x 2)     Status: None   Collection Time:  02/26/21  6:15 AM   Specimen: BLOOD RIGHT ARM  Result Value Ref Range Status   Specimen Description BLOOD RIGHT ARM  Final   Special Requests   Final    BOTTLES DRAWN AEROBIC AND ANAEROBIC Blood Culture adequate volume   Culture   Final    NO GROWTH 5 DAYS Performed at [facility], [address], [place], [place] [address]    Report Status 03/03/2021 FINAL  Final  Blood culture (routine x 2)     Status: None   Collection Time: 02/26/21  6:30 AM   Specimen: BLOOD LEFT HAND  Result Value Ref Range Status   Specimen Description BLOOD LEFT HAND  Final   Special Requests   Final    BOTTLES DRAWN AEROBIC ONLY Blood Culture results may not be optimal due to an excessive volume of blood received in culture bottles   Culture   Final    NO GROWTH 5 DAYS Performed at  [facility], [address] [place], [place] [address]    Report Status 03/03/2021 FINAL  Final  Resp Panel by RT-PCR (Flu A&B, Covid) Nasopharyngeal Swab     Status: None   Collection Time: 02/26/21  7:38 AM   Specimen: Nasopharyngeal Swab; Nasopharyngeal(NP) swabs in vial transport medium  Result Value Ref Range Status   SARS Coronavirus 2 by RT PCR NEGATIVE NEGATIVE Final    Comment: (NOTE) SARS-CoV-2 target nucleic acids are NOT DETECTED.  The SARS-CoV-2 RNA is generally detectable in upper respiratory specimens during the acute phase of infection. The lowest concentration of SARS-CoV-2 viral copies this assay can detect is 138 copies/mL. A negative result does not preclude SARS-Cov-2 infection and should not be used as the sole basis for treatment or other patient management decisions. A negative result may occur with  improper specimen collection/handling, submission of specimen other than nasopharyngeal swab, presence of viral mutation(s) within the areas targeted by this assay, and inadequate number of viral copies(<138 copies/mL). A negative result must be combined with clinical observations,  patient history, and epidemiological information. The expected result is Negative.  Fact Sheet for Patients:  BloggerCourse.com  Fact Sheet for Healthcare Providers:  SeriousBroker.it  This test is no t yet approved or cleared by the [place] FDA and  has been authorized for detection and/or diagnosis of SARS-CoV-2 by FDA under an Emergency Use Authorization (EUA). This EUA will remain  in effect (meaning this test can be used) for the duration of the COVID-19 declaration under Section 564(b)(1) of the Act, 21 U.S.C.section 360bbb-3(b)(1), unless the authorization is terminated  or revoked sooner.       Influenza A by PCR NEGATIVE NEGATIVE Final   Influenza B by PCR NEGATIVE NEGATIVE Final    Comment: (NOTE) The Xpert Xpress SARS-CoV-2/FLU/RSV plus assay is intended as an aid in the diagnosis of influenza from Nasopharyngeal swab specimens and should not be used as a sole basis for treatment. Nasal washings and aspirates are unacceptable for Xpert Xpress SARS-CoV-2/FLU/RSV testing.  Fact Sheet for Patients: BloggerCourse.com  Fact Sheet for Healthcare Providers: SeriousBroker.it  This test is not yet approved or cleared by the [place] FDA and has been authorized for detection and/or diagnosis of SARS-CoV-2 by FDA under an Emergency Use Authorization (EUA). This EUA will remain in effect (meaning this test can be used) for the duration of the COVID-19 declaration under Section 564(b)(1) of the Act, 21 U.S.C. section 360bbb-3(b)(1), unless the authorization is terminated or revoked.  Performed at [facility], [address] [place], [place] [address]   Resp Panel by RT-PCR (Flu A&B, Covid) Nasopharyngeal Swab     Status: None   Collection Time: 03/07/21  2:55 AM   Specimen: Nasopharyngeal Swab; Nasopharyngeal(NP) swabs in vial transport medium  Result  Value Ref Range Status   SARS Coronavirus 2 by RT PCR NEGATIVE NEGATIVE Final    Comment: (NOTE) SARS-CoV-2 target nucleic acids are NOT DETECTED.  The SARS-CoV-2 RNA is generally detectable in upper respiratory specimens during the acute phase of infection. The lowest concentration of SARS-CoV-2 viral copies this assay can detect is 138 copies/mL. A negative result does not preclude SARS-Cov-2 infection and should not be used as the sole basis for treatment or other patient management decisions. A negative result may occur with  improper specimen collection/handling, submission of specimen other than nasopharyngeal swab, presence of viral mutation(s) within the areas targeted by this assay, and inadequate number of viral copies(<138  copies/mL). A negative result must be combined with clinical observations, patient history, and epidemiological information. The expected result is Negative.  Fact Sheet for Patients:  BloggerCourse.com  Fact Sheet for Healthcare Providers:  SeriousBroker.it  This test is no t yet approved or cleared by the [place] FDA and  has been authorized for detection and/or diagnosis of SARS-CoV-2 by FDA under an Emergency Use Authorization (EUA). This EUA will remain  in effect (meaning this test can be used) for the duration of the COVID-19 declaration under Section 564(b)(1) of the Act, 21 U.S.C.section 360bbb-3(b)(1), unless the authorization is terminated  or revoked sooner.       Influenza A by PCR NEGATIVE NEGATIVE Final   Influenza B by PCR NEGATIVE NEGATIVE Final    Comment: (NOTE) The Xpert Xpress SARS-CoV-2/FLU/RSV plus assay is intended as an aid in the diagnosis of influenza from Nasopharyngeal swab specimens and should not be used as a sole basis for treatment. Nasal washings and aspirates are unacceptable for Xpert Xpress SARS-CoV-2/FLU/RSV testing.  Fact Sheet for  Patients: BloggerCourse.com  Fact Sheet for Healthcare Providers: SeriousBroker.it  This test is not yet approved or cleared by the [place] FDA and has been authorized for detection and/or diagnosis of SARS-CoV-2 by FDA under an Emergency Use Authorization (EUA). This EUA will remain in effect (meaning this test can be used) for the duration of the COVID-19 declaration under Section 564(b)(1) of the Act, 21 U.S.C. section 360bbb-3(b)(1), unless the authorization is terminated or revoked.  Performed at [facility], [address] [place], [place] [address]      Radiology Studies: No results found.  Scheduled Meds:  aspirin EC  81 mg Oral Daily   Buprenorphine HCl  300 mcg Buccal BID   clopidogrel  75 mg Oral Q breakfast   DULoxetine  30 mg Oral Daily   enoxaparin (LOVENOX) injection  75 mg Subcutaneous Q24H   nicotine  21 mg Transdermal Daily   pregabalin  300 mg Oral Daily   rosuvastatin  10 mg Oral Daily   Continuous Infusions:  ceFEPime (MAXIPIME) IV     lactated ringers 85 mL/hr at 03/07/21 0620   vancomycin       LOS: 0 days   Time spent:  Azucena Fallen, DO [facility]  If 7PM-7AM, please contact night-coverage [email or web address]  03/07/2021, 8:28 AM

## 2021-03-07 NOTE — Progress Notes (Addendum)
Pharmacy Antibiotic Note  Sean [name] is a 50 y.o. male admitted on 03/06/2021 with  wound infection/dehiscence .  Pharmacy has been consulted for Vancomycin/Cefepime dosing. WBC elevated. Renal function ok.   Plan: -Vancomycin 1750 mg IV q12h >>Estimated AUC: 506 -Cefepime 2g IV q8h -Trend WBC, temp, renal function  -F/U infectious work-up -Drug levels as indicated   Temp (24hrs), Avg:97.4 F (36.3 C), Min:97.4 F (36.3 C), Max:97.4 F (36.3 C)  Recent Labs  Lab 02/28/21 1751 03/01/21 0057 03/02/21 0203 03/06/21 2320  WBC 11.5* 10.8* 9.1 14.5*  CREATININE 1.05  --   --  1.04    Estimated Creatinine Clearance: 135.9 mL/min (by C-G formula based on SCr of 1.04 mg/dL).    No Known Allergies  Sean [name], PharmD, BCPS Clinical Pharmacist Phone: [phone]

## 2021-03-07 NOTE — H&P (Signed)
History and Physical    Ronal Maybury VVO:[id number] DOB: May 28, 1971 DOA: 03/06/2021  PCP: Virgilio Belling, PA-C   Patient coming from: home   Chief Complaint: Surgical wound opened and draining   HPI: Delane Wessinger is a 50 y.o. male with medical history significant for psoriasis, hypertension, chronic pain, current smoker, and peripheral arterial disease with history of left AKA and recent right superficial femoral artery to tibioperoneal trunk bypass, now presenting to the emergency department for evaluation of wound dehiscence and purulent drainage.  Patient underwent right SFA to tibioperoneal trunk bypass on 01/23/2021, was then admitted to the hospital with right foot pain and underwent bypass thrombectomy on July 6, and then angiography with outflow stenosis angioplasty on July 8.  He was discharged on 03/02/2021 and had been doing fairly well back at home until he noticed that the surgical wound was opening and draining blood and pus.  He has had occasional pain at the site.  He has not noticed any fevers or chills.  He denies any chest pain, cough, or shortness of breath.  ED Course: Upon arrival to the ED, patient is found to be afebrile, saturating well on room air, and with blood pressure 99/65.  CBC with leukocytosis to 14,500, new mild thrombocytosis, and improved normocytic anemia.  Vascular surgery was consulted by the ED physician and medical admission was recommended.  Patient was treated with vancomycin and Zosyn in the ED.  Review of Systems:  All other systems reviewed and apart from HPI, are negative.  Past Medical History:  Diagnosis Date   Acquired deformity of right hand 11/27/2015   Formatting of this note might be different from the original. Secondary to injury/surgeries   AKA stump complication (HCC) 02/13/2020   Arthritis 05/06/2014   Carpal tunnel syndrome 05/06/2014   Chronic pain syndrome 02/13/2020   Class 2 severe obesity due to excess calories with serious  comorbidity and body mass index (BMI) of 35.0 to 35.9 in adult [facility]) 12/10/2020   Depression    Dislocation of distal interphalangeal (DIP) joint of finger 05/06/2014   Encounter for immunization 12/05/2020   Erectile dysfunction due to diseases classified elsewhere 04/06/2020   Essential hypertension 11/27/2015   History of left below knee amputation (HCC) 05/16/2019   Hypertension    Impaired mobility and ADLs 05/16/2019   Ischemia of extremity 09/21/2018   Ischemia of left lower extremity 04/28/2019   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 Added automatically from request for surgery [phone]   Other long term (current) drug therapy 12/05/2020   PAD (peripheral artery disease) (HCC) 04/28/2019   Phantom limb syndrome with pain (HCC) 09/12/2019   Formatting of this note might be different from the original. Added automatically from request for surgery [id number]   PVD (peripheral vascular disease) (HCC)    Scalp psoriasis 11/27/2015   Tobacco use disorder 12/10/2020   Weak urinary stream 12/10/2020    Past Surgical History:  Procedure Laterality Date   ABDOMINAL AORTOGRAM W/LOWER EXTREMITY N/A 05/08/2019   Procedure: ABDOMINAL AORTOGRAM W/LOWER EXTREMITY;  Surgeon: Maeola Harman, MD;  Location: [facility] INVASIVE CV LAB;  Service: Cardiovascular;  Laterality: N/A;   ABDOMINAL AORTOGRAM W/LOWER EXTREMITY N/A 12/23/2020   Procedure: ABDOMINAL AORTOGRAM W/LOWER EXTREMITY;  Surgeon: Maeola Harman, MD;  Location: [facility] INVASIVE CV LAB;  Service: Cardiovascular;  Laterality: N/A;   ABDOMINAL AORTOGRAM W/LOWER EXTREMITY N/A 02/28/2021  Procedure: ABDOMINAL AORTOGRAM W/LOWER EXTREMITY;  Surgeon: Leonie Douglas, MD;  Location: [facility] INVASIVE CV LAB;  Service: Cardiovascular;  Laterality: N/A;   AMPUTATION Left 01/30/2020   Procedure: LEFT ABOVE KNEE AMPUTATION;  Surgeon: Chuck Hint, MD;  Location: [facility] OR;  Service: Vascular;  Laterality: Left;   ANGIOPLASTY Left 04/27/2019   Procedure: Balloon Angioplasty of left posterior tibial artery;  Surgeon: Maeola Harman, MD;  Location: [facility] OR;  Service: Vascular;  Laterality: Left;   EMBOLECTOMY Left 09/21/2018   Procedure: THROMBECTOMY LEFT POPLITEAL ARTERY BYPASS LEFT POPLITEAL TO TIBIAL ARTERY WITH SAPHENOUS VEIN HARVEST - FASCIOTOMY;  Surgeon: Chuck Hint, MD;  Location: [facility] OR;  Service: Vascular;  Laterality: Left;   FEMORAL-POPLITEAL BYPASS GRAFT Right 01/23/2021   Procedure: RIGHT LOWER EXTREMITY FEMORAL-POPLITEAL ARTERY BYPASS;  Surgeon: Maeola Harman, MD;  Location: [facility] OR;  Service: Vascular;  Laterality: Right;   finger surgery Right    small finger  (2 different surgeries)   LOWER EXTREMITY ANGIOGRAM Left 04/27/2019   Procedure: LEFT LOWER EXTREMITY ANGIOGRAM;  Surgeon: Maeola Harman, MD;  Location: [facility] OR;  Service: Vascular;  Laterality: Left;   PERIPHERAL VASCULAR BALLOON ANGIOPLASTY Left 05/08/2019   Procedure: PERIPHERAL VASCULAR BALLOON ANGIOPLASTY;  Surgeon: Maeola Harman, MD;  Location: [facility] INVASIVE CV LAB;  Service: Cardiovascular;  Laterality: Left;  Posterior tibial   PERIPHERAL VASCULAR BALLOON ANGIOPLASTY  02/28/2021   Procedure: PERIPHERAL VASCULAR BALLOON ANGIOPLASTY;  Surgeon: Leonie Douglas, MD;  Location: [facility] INVASIVE CV LAB;  Service: Cardiovascular;;   PERIPHERAL VASCULAR INTERVENTION Left 04/28/2019   Procedure: PERIPHERAL VASCULAR INTERVENTION;  Surgeon: Cephus Shelling, MD;  Location: [facility] INVASIVE CV LAB;  Service: Cardiovascular;  Laterality: Left;   PERIPHERAL VASCULAR THROMBECTOMY Bilateral 04/28/2019   Procedure: PERIPHERAL VASCULAR THROMBECTOMY;  Surgeon: Cephus Shelling, MD;  Location: [facility] INVASIVE CV LAB;  Service: Cardiovascular;  Laterality: Bilateral;   PERIPHERAL VASCULAR THROMBECTOMY Left 05/08/2019   Procedure: PERIPHERAL VASCULAR  THROMBECTOMY;  Surgeon: Maeola Harman, MD;  Location: [facility] INVASIVE CV LAB;  Service: Cardiovascular;  Laterality: Left;  posterior tibial   THROMBECTOMY OF BYPASS GRAFT FEMORAL- TIBIAL ARTERY Right 02/26/2021   Procedure: RIGHT LOWER EXTREMITY  THROMBECTOMY;  Surgeon: Nada Libman, MD;  Location: [facility] OR;  Service: Vascular;  Laterality: Right;   TRANSMETATARSAL AMPUTATION Left 05/09/2019   Procedure: Left Below Knee Amputation;  Surgeon: Maeola Harman, MD;  Location: [facility] OR;  Service: Vascular;  Laterality: Left;    Social History:   reports that he has been smoking cigarettes. He has been smoking an average of 1 pack per day. He has never used smokeless tobacco. He reports that he does not drink alcohol and does not use drugs.  No Known Allergies  Family History  Problem Relation Age of Onset   Diabetes Father      Prior to Admission medications   Medication Sig Start Date End Date Taking? Authorizing Provider  Adalimumab (HUMIRA PEN) 40 MG/0.4ML PNKT Inject 40 mg into the skin every 14 (fourteen) days.   Yes [provider]  amLODipine (NORVASC) 10 MG tablet Take 10 mg by mouth in the morning. 11/23/18  Yes [provider]  aspirin EC 81 MG tablet Take 81 mg by mouth in the morning.   Yes [provider]  bisacodyl (DULCOLAX) 5 MG EC tablet Take 5 mg by mouth daily as needed for moderate constipation.   Yes [provider]  Buprenorphine HCl (BELBUCA) 300 MCG  FILM Place 300 mcg inside cheek in the morning and at bedtime. 02/13/20  Yes [provider]  clobetasol ointment (TEMOVATE) 0.05 % Apply 1 application topically 2 (two) times daily as needed (skin irritation). 06/02/20  Yes [provider]  clopidogrel (PLAVIX) 75 MG tablet Take 1 tablet (75 mg total) by mouth daily with breakfast. 04/30/19  Yes Emilie Rutter, PA-C  cyanocobalamin (,VITAMIN B-12,) 1000 MCG/ML injection Inject 1,000 mcg into the muscle once a  week.   Yes [provider]  DULoxetine (CYMBALTA) 30 MG capsule Take 30 mg by mouth in the morning. 01/09/20  Yes [provider]  hydrALAZINE (APRESOLINE) 10 MG tablet Take 10 mg by mouth 2 (two) times daily. 12/20/20  Yes [provider]  olmesartan-hydrochlorothiazide (BENICAR HCT) 40-25 MG tablet Take 1 tablet by mouth daily. 12/18/20  Yes [provider]  oxyCODONE-acetaminophen (PERCOCET/ROXICET) 5-325 MG tablet Take 1-2 tablets by mouth every 4 (four) hours as needed for moderate pain. 03/02/21  Yes Leonie Douglas, MD  potassium chloride SA (KLOR-CON) 20 MEQ tablet Take 20 mEq by mouth daily.   Yes [provider]  pregabalin (LYRICA) 300 MG capsule Take 300 mg by mouth daily.   Yes [provider]  rosuvastatin (CRESTOR) 10 MG tablet Take 10 mg by mouth daily.   Yes [provider]  Testosterone 20.25 MG/ACT (1.62%) GEL Apply 40.5 mg topically daily. Rub on shoulder 12/27/20  Yes [provider]  Vitamin D, Ergocalciferol, (DRISDOL) 1.25 MG (50000 UNIT) CAPS capsule Take 50,000 Units by mouth every Thursday. 12/11/20  Yes [provider]  zinc gluconate 50 MG tablet Take 50 mg by mouth daily.   Yes [provider]    Physical Exam: Vitals:   03/07/21 0415 03/07/21 0445 03/07/21 0500 03/07/21 0515  BP: 107/74 97/70 114/65 107/78  Pulse:   75 85  Resp: (!) 21 15 14 18   Temp:      TempSrc:      SpO2: 98%  97% 98%    Constitutional: NAD, calm  Eyes: PERTLA, lids and conjunctivae normal ENMT: Mucous membranes are moist. Posterior pharynx clear of any exudate or lesions.   Neck: supple, no masses  Respiratory: no wheezing, no crackles. No accessory muscle use.  Cardiovascular: S1 & S2 heard, regular rate and rhythm. No JVD.   Abdomen: No distension, no tenderness, soft. Bowel sounds active.  Musculoskeletal: no clubbing / cyanosis. Status-post left AKA.   Skin: wound dehiscence with bloody and  purulent drainage at medial aspect lower right leg. Warm, dry, well-perfused. Neurologic: CN 2-12 grossly intact. Sensation intact. Moving all extremities.  Psychiatric: Alert and oriented to person, place, and situation. Very pleasant and cooperative.    Labs and Imaging on Admission: I have personally reviewed following labs and imaging studies  CBC: Recent Labs  Lab 02/28/21 1751 03/01/21 0057 03/02/21 0203 03/06/21 2320  WBC 11.5* 10.8* 9.1 14.5*  NEUTROABS  --   --   --  8.6*  HGB 7.7* 7.2* 7.8* 8.7*  HCT 23.8* 22.5* 23.7* 27.4*  MCV 82.4 83.0 82.9 82.0  PLT 172 180 198 413*   Basic Metabolic Panel: Recent Labs  Lab 02/28/21 1751 03/06/21 2320  NA  --  135  K  --  3.8  CL  --  100  CO2  --  26  GLUCOSE  --  161*  BUN  --  11  CREATININE 1.05 1.04  CALCIUM  --  8.7*   GFR: Estimated  Creatinine Clearance: 135.9 mL/min (by C-G formula based on SCr of 1.04 mg/dL). Liver Function Tests: Recent Labs  Lab 03/06/21 2320  AST 14*  ALT 18  ALKPHOS 60  BILITOT 0.6  PROT 6.3*  ALBUMIN 3.2*   No results for input(s): LIPASE, AMYLASE in the last 168 hours. No results for input(s): AMMONIA in the last 168 hours. Coagulation Profile: No results for input(s): INR, PROTIME in the last 168 hours. Cardiac Enzymes: No results for input(s): CKTOTAL, CKMB, CKMBINDEX, TROPONINI in the last 168 hours. BNP (last 3 results) No results for input(s): PROBNP in the last 8760 hours. HbA1C: No results for input(s): HGBA1C in the last 72 hours. CBG: No results for input(s): GLUCAP in the last 168 hours. Lipid Profile: No results for input(s): CHOL, HDL, LDLCALC, TRIG, CHOLHDL, LDLDIRECT in the last 72 hours. Thyroid Function Tests: No results for input(s): TSH, T4TOTAL, FREET4, T3FREE, THYROIDAB in the last 72 hours. Anemia Panel: No results for input(s): VITAMINB12, FOLATE, FERRITIN, TIBC, IRON, RETICCTPCT in the last 72 hours. Urine analysis:    Component Value Date/Time    COLORURINE STRAW (A) 01/16/2021 1119   APPEARANCEUR CLEAR 01/16/2021 1119   LABSPEC 1.008 01/16/2021 1119   PHURINE 7.0 01/16/2021 1119   GLUCOSEU NEGATIVE 01/16/2021 1119   HGBUR SMALL (A) 01/16/2021 1119   BILIRUBINUR NEGATIVE 01/16/2021 1119   KETONESUR NEGATIVE 01/16/2021 1119   PROTEINUR NEGATIVE 01/16/2021 1119   NITRITE NEGATIVE 01/16/2021 1119   LEUKOCYTESUR NEGATIVE 01/16/2021 1119   Sepsis Labs: (procalcitonin:4,lacticidven:4) ) Recent Results (from the past 240 hour(s))  Blood culture (routine x 2)     Status: None   Collection Time: 02/26/21  6:15 AM   Specimen: BLOOD RIGHT ARM  Result Value Ref Range Status   Specimen Description BLOOD RIGHT ARM  Final   Special Requests   Final    BOTTLES DRAWN AEROBIC AND ANAEROBIC Blood Culture adequate volume   Culture   Final    NO GROWTH 5 DAYS Performed at [facility] Lab, [address], [place], [place] [address]    Report Status 03/03/2021 FINAL  Final  Blood culture (routine x 2)     Status: None   Collection Time: 02/26/21  6:30 AM   Specimen: BLOOD LEFT HAND  Result Value Ref Range Status   Specimen Description BLOOD LEFT HAND  Final   Special Requests   Final    BOTTLES DRAWN AEROBIC ONLY Blood Culture results may not be optimal due to an excessive volume of blood received in culture bottles   Culture   Final    NO GROWTH 5 DAYS Performed at [facility], [address] [place], [place] [address]    Report Status 03/03/2021 FINAL  Final  Resp Panel by RT-PCR (Flu A&B, Covid) Nasopharyngeal Swab     Status: None   Collection Time: 02/26/21  7:38 AM   Specimen: Nasopharyngeal Swab; Nasopharyngeal(NP) swabs in vial transport medium  Result Value Ref Range Status   SARS Coronavirus 2 by RT PCR NEGATIVE NEGATIVE Final    Comment: (NOTE) SARS-CoV-2 target nucleic acids are NOT DETECTED.  The SARS-CoV-2 RNA is generally detectable in upper respiratory specimens during the acute phase of  infection. The lowest concentration of SARS-CoV-2 viral copies this assay can detect is 138 copies/mL. A negative result does not preclude SARS-Cov-2 infection and should not be used as the sole basis for treatment or other patient management decisions. A negative result may occur with  improper specimen  collection/handling, submission of specimen other than nasopharyngeal swab, presence of viral mutation(s) within the areas targeted by this assay, and inadequate number of viral copies(<138 copies/mL). A negative result must be combined with clinical observations, patient history, and epidemiological information. The expected result is Negative.  Fact Sheet for Patients:  BloggerCourse.com  Fact Sheet for Healthcare Providers:  SeriousBroker.it  This test is no t yet approved or cleared by the [place] FDA and  has been authorized for detection and/or diagnosis of SARS-CoV-2 by FDA under an Emergency Use Authorization (EUA). This EUA will remain  in effect (meaning this test can be used) for the duration of the COVID-19 declaration under Section 564(b)(1) of the Act, 21 U.S.C.section 360bbb-3(b)(1), unless the authorization is terminated  or revoked sooner.       Influenza A by PCR NEGATIVE NEGATIVE Final   Influenza B by PCR NEGATIVE NEGATIVE Final    Comment: (NOTE) The Xpert Xpress SARS-CoV-2/FLU/RSV plus assay is intended as an aid in the diagnosis of influenza from Nasopharyngeal swab specimens and should not be used as a sole basis for treatment. Nasal washings and aspirates are unacceptable for Xpert Xpress SARS-CoV-2/FLU/RSV testing.  Fact Sheet for Patients: BloggerCourse.com  Fact Sheet for Healthcare Providers: SeriousBroker.it  This test is not yet approved or cleared by the [place] FDA and has been authorized for detection and/or diagnosis of SARS-CoV-2  by FDA under an Emergency Use Authorization (EUA). This EUA will remain in effect (meaning this test can be used) for the duration of the COVID-19 declaration under Section 564(b)(1) of the Act, 21 U.S.C. section 360bbb-3(b)(1), unless the authorization is terminated or revoked.  Performed at [facility], [address], [place], [place] [address]   Resp Panel by RT-PCR (Flu A&B, Covid) Nasopharyngeal Swab     Status: None   Collection Time: 03/07/21  2:55 AM   Specimen: Nasopharyngeal Swab; Nasopharyngeal(NP) swabs in vial transport medium  Result Value Ref Range Status   SARS Coronavirus 2 by RT PCR NEGATIVE NEGATIVE Final    Comment: (NOTE) SARS-CoV-2 target nucleic acids are NOT DETECTED.  The SARS-CoV-2 RNA is generally detectable in upper respiratory specimens during the acute phase of infection. The lowest concentration of SARS-CoV-2 viral copies this assay can detect is 138 copies/mL. A negative result does not preclude SARS-Cov-2 infection and should not be used as the sole basis for treatment or other patient management decisions. A negative result may occur with  improper specimen collection/handling, submission of specimen other than nasopharyngeal swab, presence of viral mutation(s) within the areas targeted by this assay, and inadequate number of viral copies(<138 copies/mL). A negative result must be combined with clinical observations, patient history, and epidemiological information. The expected result is Negative.  Fact Sheet for Patients:  BloggerCourse.com  Fact Sheet for Healthcare Providers:  SeriousBroker.it  This test is no t yet approved or cleared by the [place] FDA and  has been authorized for detection and/or diagnosis of SARS-CoV-2 by FDA under an Emergency Use Authorization (EUA). This EUA will remain  in effect (meaning this test can be used) for the duration of the COVID-19  declaration under Section 564(b)(1) of the Act, 21 U.S.C.section 360bbb-3(b)(1), unless the authorization is terminated  or revoked sooner.       Influenza A by PCR NEGATIVE NEGATIVE Final   Influenza B by PCR NEGATIVE NEGATIVE Final    Comment: (NOTE) The Xpert Xpress SARS-CoV-2/FLU/RSV plus assay is intended as an aid in the diagnosis  of influenza from Nasopharyngeal swab specimens and should not be used as a sole basis for treatment. Nasal washings and aspirates are unacceptable for Xpert Xpress SARS-CoV-2/FLU/RSV testing.  Fact Sheet for Patients: BloggerCourse.comhttps://www.fda.gov/media/152166/download  Fact Sheet for Healthcare Providers: SeriousBroker.ithttps://www.fda.gov/media/152162/download  This test is not yet approved or cleared by the [place] FDA and has been authorized for detection and/or diagnosis of SARS-CoV-2 by FDA under an Emergency Use Authorization (EUA). This EUA will remain in effect (meaning this test can be used) for the duration of the COVID-19 declaration under Section 564(b)(1) of the Act, 21 U.S.C. section 360bbb-3(b)(1), unless the authorization is terminated or revoked.  Performed at [facility], [address] [place], [place] [id number]      Radiological Exams on Admission: No results found.  Assessment/Plan   1. Surgical wound infection  - Patient with right SFA to tibioperoneal trunk bypass 01/23/21 with bypass thrombectomy on 7/6 and outflow stenosis angioplasty 02/28/21 presents with wound dehiscence and purulent drainage  - He is found to be afebrile and hemodynamically stable with leukocytosis to 14,500 on admission  - Vascular surgery recommended medical admission and antibiotics  - Continue broad-spectrum antibiotics, follow clinical response   2. PAD  - Status-post left AKA June 2021 and right SFA to tibioperoneal trunk bypass 01/23/21 with bypass thrombectomy on 7/6 and outflow stenosis angioplasty 02/28/21  - Continue ASA, Plavix, and statin;  continue smoking cessation efforts    3. Hypertension  - BP low-normal in ED and antihypertensives held on admission    4. Anemia  - Hgb is 8.7 on admission, increasing from recent surgery  - Monitor    5. RLL consolidation  - Noted incidentally on CT from 01/22/21  - No respiratory symptoms  - Planned for repeat imaging in 2 weeks    6. Ascending aortic aneurysm  - Continue CT surgery follow-up with biannual imaging   7. Psoriasis  - Well-controlled on Humira, last injection 02/21/21  - Continue as-needed clobetasol    8. Chronic pain  - Continue home regimen with Lyrica, Cymbalta, buprenorphine    DVT prophylaxis: Lovenox  Code Status: Full  Level of Care: Level of care: Med-Surg Family Communication: None present  Disposition Plan:  Patient is from: Home  Anticipated d/c is to: Home  Anticipated d/c date is: 03/10/21 Patient currently: Pending vascular surgery evaluation  Consults called: Vascular surgery  Admission status: Inpatient     Briscoe Deutscherimothy S Saifan Rayford, MD [facility]  03/07/2021, 6:15 AM

## 2021-03-07 NOTE — ED Notes (Signed)
Pt has approx 8in suture/stapled incision on inner right lower leg. Area is swollen and red, yellow pus noted and incision separated, multiple staples pulled due to swelling. Denies fevers, chills. Pain 6/10.

## 2021-03-07 NOTE — Transfer of Care (Signed)
Immediate Anesthesia Transfer of Care Note  Patient: Sean [name]  Procedure(s) Performed: IRRIGATION AND DEBRIDEMENT CALF (Right) APPLICATION OF WOUND VAC (Right: Leg Lower)  Patient Location: PACU  Anesthesia Type:General  Level of Consciousness: drowsy  Airway & Oxygen Therapy: Patient Spontanous Breathing  Post-op Assessment: Report given to RN and Post -op Vital signs reviewed and stable  Post vital signs: Reviewed and stable  Last Vitals:  Vitals Value Taken Time  BP 101/58 03/07/21 1352  Temp 37.1 C 03/07/21 1352  Pulse    Resp 19 03/07/21 1355  SpO2 94 % 03/07/21 1352  Vitals shown include unvalidated device data.  Last Pain:  Vitals:   03/07/21 1134  TempSrc: Oral  PainSc: 4       Patients Stated Pain Goal: 0 (03/07/21 1134)  Complications: No notable events documented.

## 2021-03-07 NOTE — Interval H&P Note (Signed)
History and Physical Interval Note:  03/07/2021 12:15 PM  Sean [name]  has presented today for surgery, with the diagnosis of Wound infection Calf.  The various methods of treatment have been discussed with the patient and family. After consideration of risks, benefits and other options for treatment, the patient has consented to  Procedure(s): IRRIGATION AND DEBRIDEMENT CALF (Right) as a surgical intervention.  The patient's history has been reviewed, patient examined, no change in status, stable for surgery.  I have reviewed the patient's chart and labs.  Questions were answered to the patient's satisfaction.     [name]

## 2021-03-07 NOTE — Anesthesia Procedure Notes (Signed)
Procedure Name: LMA Insertion Date/Time: 03/07/2021 1:03 PM Performed by: Margaret Pyle, RN Pre-anesthesia Checklist: Patient identified, Emergency Drugs available, Suction available and Patient being monitored Patient Re-evaluated:Patient Re-evaluated prior to induction Oxygen Delivery Method: Circle system utilized Preoxygenation: Pre-oxygenation with 100% oxygen Induction Type: IV induction Ventilation: Mask ventilation without difficulty LMA: LMA inserted LMA Size: 5.0 Number of attempts: 1 Placement Confirmation: ETT inserted through vocal cords under direct vision, positive ETCO2 and breath sounds checked- equal and bilateral Tube secured with: Tape Dental Injury: Teeth and Oropharynx as per pre-operative assessment  Comments: Performed by Randon Goldsmith, SRNA under direct supervision by Dr. Okey Dupre and Lillette Boxer, CRNA

## 2021-03-08 LAB — BASIC METABOLIC PANEL
Anion gap: 10 (ref 5–15)
BUN: 11 mg/dL (ref 6–20)
CO2: 23 mmol/L (ref 22–32)
Calcium: 8.7 mg/dL — ABNORMAL LOW (ref 8.9–10.3)
Chloride: 105 mmol/L (ref 98–111)
Creatinine, Ser: 0.96 mg/dL (ref 0.61–1.24)
GFR, Estimated: >60 mL/min (ref >60)
Glucose, Bld: 139 mg/dL — ABNORMAL HIGH (ref 70–99)
Potassium: 4 mmol/L (ref 3.5–5.1)
Sodium: 138 mmol/L (ref 135–145)

## 2021-03-08 LAB — CBC
HCT: 23.7 % — ABNORMAL LOW (ref 39.0–52.0)
Hemoglobin: 7.6 g/dL — ABNORMAL LOW (ref 13.0–17.0)
MCH: 25.9 pg — ABNORMAL LOW (ref 26.0–34.0)
MCHC: 32.1 g/dL (ref 30.0–36.0)
MCV: 80.9 fL (ref 80.0–100.0)
Platelets: 402 10*3/uL — ABNORMAL HIGH (ref 150–400)
RBC: 2.93 MIL/uL — ABNORMAL LOW (ref 4.22–5.81)
RDW: 15.9 % — ABNORMAL HIGH (ref 11.5–15.5)
WBC: 10 10*3/uL (ref 4.0–10.5)
nRBC: 0.3 % — ABNORMAL HIGH (ref 0.0–0.2)

## 2021-03-08 LAB — HIV ANTIBODY (ROUTINE TESTING W REFLEX): HIV Screen 4th Generation wRfx: NONREACTIVE

## 2021-03-08 NOTE — Progress Notes (Signed)
PROGRESS NOTE    Sean [name]  KDX:[id number] DOB: Oct 03, 1970 DOA: 03/06/2021 PCP: Virgilio Belling, PA-C   Brief Narrative:  Sean [name] is a 50 y.o. male with medical history significant for psoriasis, hypertension, chronic pain, current smoker, and peripheral arterial disease with history of left AKA and recent right superficial femoral artery to tibioperoneal trunk bypass, now presenting to the emergency department for evaluation of wound dehiscence and purulent drainage. Vascular surgery was consulted by the ED physician and medical admission was recommended.  Patient was treated with vancomycin and Zosyn in the ED.  Assessment & Plan:   Principal Problem:   Wound infection Active Problems:   PAD (peripheral artery disease) (HCC)   Chronic pain syndrome   Psoriasis   Hypertension   Peripheral arterial disease (HCC)   Ascending aortic aneurysm (HCC)   Right lower lobe consolidation (HCC)   Normocytic anemia   Sepsis secondary to right lower extremity infection/cellulitis, POA  -Patient underwent right superficial femoral to tibioperoneal trunk bypass 02/27/2021 with posterior tibial angioplasty 03/01/2021, presents with wound dehiscence and superficial infection -Vascular following, patient to the OR 03/07/2021 for washout and wound VAC placement -tolerated procedure well, pain currently well controlled -Continue broad-spectrum antibiotics including cefepime, Zosyn, vancomycin   PAD, severe - Status-post left AKA June 2021 and right SFA to tibioperoneal trunk bypass 01/23/21 with bypass thrombectomy on 7/6 and outflow stenosis angioplasty 02/28/21  - Continue ASA, Plavix, and statin; continue smoking cessation efforts     Essential hypertension - BP low-normal in ED and antihypertensives held on admission     Acute on chronic normocytic anemia -Hemoglobin within normal limits earlier this year some 6 months ago  -Labs have been downtrending progressively since then, most  notably down post procedure  -Follow morning labs, transfuse if hemoglobin less than 7 or symptomatic   Chronic RLL consolidation  - Noted incidentally on CT from 01/22/21  - No respiratory symptoms  - Planned for repeat imaging in 2 weeks -we will repeat if still in house   Ascending aortic aneurysm  - Continue CT surgery follow-up with biannual imaging; no further work-up warranted   Psoriasis  - Well-controlled on Humira, last injection 02/21/21  - Continue as-needed clobetasol     Chronic pain  - Continue home regimen with Lyrica, Cymbalta, buprenorphine      DVT prophylaxis: Lovenox  Code Status: Full  Family Communication: None present  Status is: Inpatient  Dispo: The patient is from: Home              Anticipated d/c is to: Same              Anticipated d/c date is: 48 to 72 hours              Patient currently not medically stable for discharge  Consultants:  Vascular surgery  Procedures:  Surgical wound washout and wound VAC placement 03/07/2021  Antimicrobials:  Vanco, Zosyn, cefepime  Subjective: No acute issues or events reported overnight, pain currently well controlled denies nausea vomiting diarrhea constipation headache fevers chills chest pain shortness of breath.  Objective: Vitals:   03/08/21 0000 03/08/21 0010 03/08/21 0432 03/08/21 0600  BP: 107/62  102/64 112/70  Pulse: (!) 105 (!) 105 91 82  Resp: 17  18   Temp:   98.6 F (37 C)   TempSrc:   Oral   SpO2: 95% 94% 94% 96%  Height:        Intake/Output Summary (Last 24 hours)  at 03/08/2021 0727 Last data filed at 03/08/2021 1607 Gross per 24 hour  Intake 1600.19 ml  Output 655 ml  Net 945.19 ml    There were no vitals filed for this visit.  Examination:  General:  Pleasantly resting in bed, No acute distress. HEENT:  Normocephalic atraumatic.  Sclerae nonicteric, noninjected.  Extraocular movements intact bilaterally. Neck:  Without mass or deformity.  Trachea is midline. Lungs:   Clear to auscultate bilaterally without rhonchi, wheeze, or rales. Heart:  Regular rate and rhythm.  Without murmurs, rubs, or gallops. Abdomen:  Soft, nontender, nondistended.  Without guarding or rebound. Extremities: Without cyanosis, clubbing, edema, or obvious deformity. Vascular:  Dorsalis pedis and posterior tibial pulses weak palpable bilaterally. Skin: Right lower extremity wound VAC intact draining scant dark red fluid   Data Reviewed: I have personally reviewed following labs and imaging studies  CBC: Recent Labs  Lab 03/02/21 0203 03/06/21 2320 03/08/21 0539  WBC 9.1 14.5* 10.0  NEUTROABS  --  8.6*  --   HGB 7.8* 8.7* 7.6*  HCT 23.7* 27.4* 23.7*  MCV 82.9 82.0 80.9  PLT 198 413* 402*    Basic Metabolic Panel: Recent Labs  Lab 03/06/21 2320 03/08/21 0539  NA 135 138  K 3.8 4.0  CL 100 105  CO2 26 23  GLUCOSE 161* 139*  BUN 11 11  CREATININE 1.04 0.96  CALCIUM 8.7* 8.7*    GFR: Estimated Creatinine Clearance: 147.2 mL/min (by C-G formula based on SCr of 0.96 mg/dL). Liver Function Tests: Recent Labs  Lab 03/06/21 2320  AST 14*  ALT 18  ALKPHOS 60  BILITOT 0.6  PROT 6.3*  ALBUMIN 3.2*    No results for input(s): LIPASE, AMYLASE in the last 168 hours. No results for input(s): AMMONIA in the last 168 hours. Coagulation Profile: No results for input(s): INR, PROTIME in the last 168 hours. Cardiac Enzymes: No results for input(s): CKTOTAL, CKMB, CKMBINDEX, TROPONINI in the last 168 hours. BNP (last 3 results) No results for input(s): PROBNP in the last 8760 hours. HbA1C: No results for input(s): HGBA1C in the last 72 hours. CBG: No results for input(s): GLUCAP in the last 168 hours. Lipid Profile: No results for input(s): CHOL, HDL, LDLCALC, TRIG, CHOLHDL, LDLDIRECT in the last 72 hours. Thyroid Function Tests: No results for input(s): TSH, T4TOTAL, FREET4, T3FREE, THYROIDAB in the last 72 hours. Anemia Panel: No results for input(s):  VITAMINB12, FOLATE, FERRITIN, TIBC, IRON, RETICCTPCT in the last 72 hours. Sepsis Labs: No results for input(s): PROCALCITON, LATICACIDVEN in the last 168 hours.  Recent Results (from the past 240 hour(s))  Resp Panel by RT-PCR (Flu A&B, Covid) Nasopharyngeal Swab     Status: None   Collection Time: 02/26/21  7:38 AM   Specimen: Nasopharyngeal Swab; Nasopharyngeal(NP) swabs in vial transport medium  Result Value Ref Range Status   SARS Coronavirus 2 by RT PCR NEGATIVE NEGATIVE Final    Comment: (NOTE) SARS-CoV-2 target nucleic acids are NOT DETECTED.  The SARS-CoV-2 RNA is generally detectable in upper respiratory specimens during the acute phase of infection. The lowest concentration of SARS-CoV-2 viral copies this assay can detect is 138 copies/mL. A negative result does not preclude SARS-Cov-2 infection and should not be used as the sole basis for treatment or other patient management decisions. A negative result may occur with  improper specimen collection/handling, submission of specimen other than nasopharyngeal swab, presence of viral mutation(s) within the areas targeted by this assay, and inadequate number of viral copies(<138  copies/mL). A negative result must be combined with clinical observations, patient history, and epidemiological information. The expected result is Negative.  Fact Sheet for Patients:  BloggerCourse.comhttps://www.fda.gov/media/152166/download  Fact Sheet for Healthcare Providers:  SeriousBroker.ithttps://www.fda.gov/media/152162/download  This test is no t yet approved or cleared by the [place] FDA and  has been authorized for detection and/or diagnosis of SARS-CoV-2 by FDA under an Emergency Use Authorization (EUA). This EUA will remain  in effect (meaning this test can be used) for the duration of the COVID-19 declaration under Section 564(b)(1) of the Act, 21 U.S.C.section 360bbb-3(b)(1), unless the authorization is terminated  or revoked sooner.       Influenza A  by PCR NEGATIVE NEGATIVE Final   Influenza B by PCR NEGATIVE NEGATIVE Final    Comment: (NOTE) The Xpert Xpress SARS-CoV-2/FLU/RSV plus assay is intended as an aid in the diagnosis of influenza from Nasopharyngeal swab specimens and should not be used as a sole basis for treatment. Nasal washings and aspirates are unacceptable for Xpert Xpress SARS-CoV-2/FLU/RSV testing.  Fact Sheet for Patients: BloggerCourse.comhttps://www.fda.gov/media/152166/download  Fact Sheet for Healthcare Providers: SeriousBroker.ithttps://www.fda.gov/media/152162/download  This test is not yet approved or cleared by the [place] FDA and has been authorized for detection and/or diagnosis of SARS-CoV-2 by FDA under an Emergency Use Authorization (EUA). This EUA will remain in effect (meaning this test can be used) for the duration of the COVID-19 declaration under Section 564(b)(1) of the Act, 21 U.S.C. section 360bbb-3(b)(1), unless the authorization is terminated or revoked.  Performed at [facility], [address] [place], [place] [address]   Resp Panel by RT-PCR (Flu A&B, Covid) Nasopharyngeal Swab     Status: None   Collection Time: 03/07/21  2:55 AM   Specimen: Nasopharyngeal Swab; Nasopharyngeal(NP) swabs in vial transport medium  Result Value Ref Range Status   SARS Coronavirus 2 by RT PCR NEGATIVE NEGATIVE Final    Comment: (NOTE) SARS-CoV-2 target nucleic acids are NOT DETECTED.  The SARS-CoV-2 RNA is generally detectable in upper respiratory specimens during the acute phase of infection. The lowest concentration of SARS-CoV-2 viral copies this assay can detect is 138 copies/mL. A negative result does not preclude SARS-Cov-2 infection and should not be used as the sole basis for treatment or other patient management decisions. A negative result may occur with  improper specimen collection/handling, submission of specimen other than nasopharyngeal swab, presence of viral mutation(s) within the areas targeted  by this assay, and inadequate number of viral copies(<138 copies/mL). A negative result must be combined with clinical observations, patient history, and epidemiological information. The expected result is Negative.  Fact Sheet for Patients:  BloggerCourse.comhttps://www.fda.gov/media/152166/download  Fact Sheet for Healthcare Providers:  SeriousBroker.ithttps://www.fda.gov/media/152162/download  This test is no t yet approved or cleared by the [place] FDA and  has been authorized for detection and/or diagnosis of SARS-CoV-2 by FDA under an Emergency Use Authorization (EUA). This EUA will remain  in effect (meaning this test can be used) for the duration of the COVID-19 declaration under Section 564(b)(1) of the Act, 21 U.S.C.section 360bbb-3(b)(1), unless the authorization is terminated  or revoked sooner.       Influenza A by PCR NEGATIVE NEGATIVE Final   Influenza B by PCR NEGATIVE NEGATIVE Final    Comment: (NOTE) The Xpert Xpress SARS-CoV-2/FLU/RSV plus assay is intended as an aid in the diagnosis of influenza from Nasopharyngeal swab specimens and should not be used as a sole basis for treatment. Nasal washings and aspirates are unacceptable for Xpert  Xpress SARS-CoV-2/FLU/RSV testing.  Fact Sheet for Patients: BloggerCourse.com  Fact Sheet for Healthcare Providers: SeriousBroker.it  This test is not yet approved or cleared by the [place] FDA and has been authorized for detection and/or diagnosis of SARS-CoV-2 by FDA under an Emergency Use Authorization (EUA). This EUA will remain in effect (meaning this test can be used) for the duration of the COVID-19 declaration under Section 564(b)(1) of the Act, 21 U.S.C. section 360bbb-3(b)(1), unless the authorization is terminated or revoked.  Performed at [facility], [address] [place], [place] [address]   Aerobic Culture w Gram Stain (superficial specimen)     Status: None  (Preliminary result)   Collection Time: 03/07/21  1:24 PM   Specimen: PATH Other; Tissue  Result Value Ref Range Status   Specimen Description WOUND  Final   Special Requests RIGHT LEG WOUND SPEC A  Final   Gram Stain   Final    RARE WBC PRESENT,BOTH PMN AND MONONUCLEAR RARE GRAM NEGATIVE RODS Performed at [facility], [address], [place], [place] [address]    Culture PENDING  Incomplete   Report Status PENDING  Incomplete      Radiology Studies: No results found.  Scheduled Meds:  aspirin EC  81 mg Oral Daily   Buprenorphine HCl  300 mcg Buccal BID   clopidogrel  75 mg Oral Q breakfast   docusate sodium  100 mg Oral BID   DULoxetine  30 mg Oral Daily   heparin  5,000 Units Subcutaneous Q8H   nicotine  21 mg Transdermal Daily   pantoprazole  40 mg Oral Daily   potassium chloride  20-40 mEq Oral Once   pregabalin  300 mg Oral Daily   rosuvastatin  10 mg Oral Daily   sodium chloride flush  3 mL Intravenous Q12H   Continuous Infusions:  sodium chloride     piperacillin-tazobactam (ZOSYN)  IV 3.375 g (03/08/21 0617)   vancomycin 1,750 mg (03/07/21 2035)     LOS: 1 day   Time spent:  Azucena Fallen, DO [facility]  If 7PM-7AM, please contact night-coverage [email or web address]  03/08/2021, 7:27 AM

## 2021-03-08 NOTE — Progress Notes (Addendum)
  Progress Note    03/08/2021 7:47 AM 1 Day Post-Op  Subjective:  says his leg feels better  Tm 99.1 now afebrile  Vitals:   03/08/21 0432 03/08/21 0600  BP: 102/64 112/70  Pulse: 91 82  Resp: 18   Temp: 98.6 F (37 C)   SpO2: 94% 96%    Physical Exam: Cardiac:  regular Lungs:  non labored Incisions:  RLE wound with vac with good seal Extremities:  brisk right DP/PT doppler signals Abdomen:  soft  CBC    Component Value Date/Time   WBC 10.0 03/08/2021 0539   RBC 2.93 (L) 03/08/2021 0539   HGB 7.6 (L) 03/08/2021 0539   HCT 23.7 (L) 03/08/2021 0539   PLT 402 (H) 03/08/2021 0539   MCV 80.9 03/08/2021 0539   MCH 25.9 (L) 03/08/2021 0539   MCHC 32.1 03/08/2021 0539   RDW 15.9 (H) 03/08/2021 0539   LYMPHSABS 4.6 (H) 03/06/2021 2320   MONOABS 1.0 03/06/2021 2320   EOSABS 0.2 03/06/2021 2320   BASOSABS 0.0 03/06/2021 2320    BMET    Component Value Date/Time   NA 138 03/08/2021 0539   K 4.0 03/08/2021 0539   CL 105 03/08/2021 0539   CO2 23 03/08/2021 0539   GLUCOSE 139 (H) 03/08/2021 0539   BUN 11 03/08/2021 0539   CREATININE 0.96 03/08/2021 0539   CALCIUM 8.7 (L) 03/08/2021 0539   GFRNONAA >60 03/08/2021 0539   GFRAA >60 02/13/2020 0058    INR    Component Value Date/Time   INR 1.0 02/26/2021 1200     Intake/Output Summary (Last 24 hours) at 03/08/2021 0747 Last data filed at 03/08/2021 0630 Gross per 24 hour  Intake 1600.19 ml  Output 655 ml  Net 945.19 ml   Component 1 d ago   Specimen Description WOUND   Special Requests RIGHT LEG WOUND SPEC A   Gram Stain RARE WBC PRESENT,BOTH PMN AND MONONUCLEAR  RARE GRAM NEGATIVE RODS  Performed at [facility], [address] [place], [place] [address]   Culture PENDING   Report Status PENDING     Assessment/Plan:  50 y.o. male is s/p:  thrombectomy of right superficial femoral to tibioperoneal trunk bypass 02/27/21 and posterior tibial angioplasty 03/01/21. And I&D right leg with placement of  VAC dressing 1 Day Post-Op   -pt with brisk doppler signals right DP/PT -continue broad spectrum abx until final culture -acute blood loss anemia-pt tolerating.  Will defer to primary team for transfusion -pharmacy working on pain management for pt given he is on buprenorphine at home.  Discussed with pt and his wife is supposed to be bringing his home pain med to hospital. -DVT prophylaxis:  sq heparin -pt continues to smoke-discussed importance of cessation.   Doreatha Massed, PA-C [facility] [phone] 03/08/2021 7:47 AM  I have seen and evaluated the patient. I agree with the PA note as documented above.  Postop day 1 status post I&D of right leg hematoma.  VAC with good seal.  Good Doppler signals in the right foot.  We will follow culture data with some rare gram-negative rods.  Remains on Vanc Zosyn.  Cephus Shelling, MD [facility] [place] Office: [phone]

## 2021-03-09 LAB — BASIC METABOLIC PANEL
Anion gap: 8 (ref 5–15)
BUN: 8 mg/dL (ref 6–20)
CO2: 23 mmol/L (ref 22–32)
Calcium: 8.2 mg/dL — ABNORMAL LOW (ref 8.9–10.3)
Chloride: 108 mmol/L (ref 98–111)
Creatinine, Ser: 0.83 mg/dL (ref 0.61–1.24)
GFR, Estimated: >60 mL/min (ref >60)
Glucose, Bld: 124 mg/dL — ABNORMAL HIGH (ref 70–99)
Potassium: 3.6 mmol/L (ref 3.5–5.1)
Sodium: 139 mmol/L (ref 135–145)

## 2021-03-09 LAB — CBC
HCT: 22.9 % — ABNORMAL LOW (ref 39.0–52.0)
Hemoglobin: 7.1 g/dL — ABNORMAL LOW (ref 13.0–17.0)
MCH: 25 pg — ABNORMAL LOW (ref 26.0–34.0)
MCHC: 31 g/dL (ref 30.0–36.0)
MCV: 80.6 fL (ref 80.0–100.0)
Platelets: 375 10*3/uL (ref 150–400)
RBC: 2.84 MIL/uL — ABNORMAL LOW (ref 4.22–5.81)
RDW: 15.9 % — ABNORMAL HIGH (ref 11.5–15.5)
WBC: 9.1 10*3/uL (ref 4.0–10.5)
nRBC: 0.2 % (ref 0.0–0.2)

## 2021-03-09 MED ORDER — BUPRENORPHINE HCL 300 MCG BU FILM
300.0000 ug | ORAL_FILM | Freq: Two times a day (BID) | BUCCAL | Status: DC
  Administered 2021-03-09 – 2021-03-11 (×4): 300 ug via BUCCAL
  Filled 2021-03-09: qty 1

## 2021-03-09 NOTE — Progress Notes (Signed)
PROGRESS NOTE    Sean [name]  TRZ:[id number] DOB: Apr 07, 1971 DOA: 03/06/2021 PCP: Virgilio Belling, PA-C   Brief Narrative:  Sean [name] is a 50 y.o. male with medical history significant for psoriasis, hypertension, chronic pain, current smoker, and peripheral arterial disease with history of left AKA and recent right superficial femoral artery to tibioperoneal trunk bypass, now presenting to the emergency department for evaluation of wound dehiscence and purulent drainage. Vascular surgery was consulted by the ED physician and medical admission was recommended.  Patient was treated with vancomycin and Zosyn in the ED.  Assessment & Plan:   Principal Problem:   Wound infection Active Problems:   PAD (peripheral artery disease) (HCC)   Chronic pain syndrome   Psoriasis   Hypertension   Peripheral arterial disease (HCC)   Ascending aortic aneurysm (HCC)   Right lower lobe consolidation (HCC)   Normocytic anemia   Sepsis secondary to right lower extremity infection/cellulitis, POA  -Patient underwent right superficial femoral to tibioperoneal trunk bypass 02/27/2021 with posterior tibial angioplasty 03/01/2021, presents with wound dehiscence and superficial infection -Vascular following, patient to the OR 03/07/2021 for washout and wound VAC placement -tolerated procedure well, pain currently well controlled -Continue broad-spectrum antibiotics including Zosyn, vancomycin   PAD, severe - Status-post left AKA June 2021 and right SFA to tibioperoneal trunk bypass 01/23/21 with bypass thrombectomy on 7/6 and outflow stenosis angioplasty 02/28/21  - Continue ASA, Plavix, and statin; continue smoking cessation efforts     Essential hypertension - BP low-normal in ED and antihypertensives held on admission     Acute on chronic normocytic anemia -Hemoglobin within normal limits earlier this year some 6 months ago  -Labs have been downtrending slowly over months, concern for malnutrition  and iron deficiency -Follow morning labs, transfuse if hemoglobin less than 7 or symptomatic   Chronic RLL consolidation  - Noted incidentally on CT from 01/22/21  - No respiratory symptoms  - Planned for repeat imaging in 2 weeks -we will repeat if still in house   Ascending aortic aneurysm  - Continue CT surgery follow-up with biannual imaging; no further work-up warranted   Psoriasis  - Well-controlled on Humira, last injection 02/21/21  - Continue as-needed clobetasol     Chronic pain  - Continue home regimen with Lyrica, Cymbalta, buprenorphine      DVT prophylaxis: Lovenox  Code Status: Full  Family Communication: None present  Status is: Inpatient  Dispo: The patient is from: Home              Anticipated d/c is to: Same              Anticipated d/c date is: 48 to 72 hours              Patient currently not medically stable for discharge  Consultants:  Vascular surgery  Procedures:  Surgical wound washout and wound VAC placement 03/07/2021  Antimicrobials:  Vanco, Zosyn  Subjective: No acute issues or events reported overnight, pain is currently controlled he denies any headache fevers chills nausea vomiting diarrhea constipation.  Objective: Vitals:   03/08/21 1600 03/08/21 1900 03/08/21 2327 03/09/21 0430  BP: 129/80 117/71 115/70 114/67  Pulse: 89 95 91 81  Resp: 18 18 18 12   Temp: 98.4 F (36.9 C) 97.9 F (36.6 C) 98.1 F (36.7 C) 98.4 F (36.9 C)  TempSrc: Oral Oral Oral Oral  SpO2: 99% 100% 98% 100%  Height:        Intake/Output Summary (Last  24 hours) at 03/09/2021 0726 Last data filed at 03/09/2021 0603 Gross per 24 hour  Intake 240 ml  Output 1330 ml  Net -1090 ml    There were no vitals filed for this visit.  Examination:  General:  Pleasantly resting in bed, No acute distress. HEENT:  Normocephalic atraumatic.  Sclerae nonicteric, noninjected.  Extraocular movements intact bilaterally. Neck:  Without mass or deformity.  Trachea is  midline. Lungs:  Clear to auscultate bilaterally without rhonchi, wheeze, or rales. Heart:  Regular rate and rhythm.  Without murmurs, rubs, or gallops. Abdomen:  Soft, nontender, nondistended.  Without guarding or rebound. Extremities: Without cyanosis, clubbing, edema, or obvious deformity. Vascular:  Dorsalis pedis and posterior tibial pulses weak palpable bilaterally. Skin: Right lower extremity wound VAC intact draining scant dark red fluid   Data Reviewed: I have personally reviewed following labs and imaging studies  CBC: Recent Labs  Lab 03/06/21 2320 03/08/21 0539 03/09/21 0622  WBC 14.5* 10.0 9.1  NEUTROABS 8.6*  --   --   HGB 8.7* 7.6* 7.1*  HCT 27.4* 23.7* 22.9*  MCV 82.0 80.9 80.6  PLT 413* 402* 375    Basic Metabolic Panel: Recent Labs  Lab 03/06/21 2320 03/08/21 0539 03/09/21 0622  NA 135 138 139  K 3.8 4.0 3.6  CL 100 105 108  CO2 26 23 23   GLUCOSE 161* 139* 124*  BUN 11 11 8   CREATININE 1.04 0.96 0.83  CALCIUM 8.7* 8.7* 8.2*    GFR: Estimated Creatinine Clearance: 170.2 mL/min (by C-G formula based on SCr of 0.83 mg/dL). Liver Function Tests: Recent Labs  Lab 03/06/21 2320  AST 14*  ALT 18  ALKPHOS 60  BILITOT 0.6  PROT 6.3*  ALBUMIN 3.2*    No results for input(s): LIPASE, AMYLASE in the last 168 hours. No results for input(s): AMMONIA in the last 168 hours. Coagulation Profile: No results for input(s): INR, PROTIME in the last 168 hours. Cardiac Enzymes: No results for input(s): CKTOTAL, CKMB, CKMBINDEX, TROPONINI in the last 168 hours. BNP (last 3 results) No results for input(s): PROBNP in the last 8760 hours. HbA1C: No results for input(s): HGBA1C in the last 72 hours. CBG: No results for input(s): GLUCAP in the last 168 hours. Lipid Profile: No results for input(s): CHOL, HDL, LDLCALC, TRIG, CHOLHDL, LDLDIRECT in the last 72 hours. Thyroid Function Tests: No results for input(s): TSH, T4TOTAL, FREET4, T3FREE, THYROIDAB in  the last 72 hours. Anemia Panel: No results for input(s): VITAMINB12, FOLATE, FERRITIN, TIBC, IRON, RETICCTPCT in the last 72 hours. Sepsis Labs: No results for input(s): PROCALCITON, LATICACIDVEN in the last 168 hours.  Recent Results (from the past 240 hour(s))  Resp Panel by RT-PCR (Flu A&B, Covid) Nasopharyngeal Swab     Status: None   Collection Time: 03/07/21  2:55 AM   Specimen: Nasopharyngeal Swab; Nasopharyngeal(NP) swabs in vial transport medium  Result Value Ref Range Status   SARS Coronavirus 2 by RT PCR NEGATIVE NEGATIVE Final    Comment: (NOTE) SARS-CoV-2 target nucleic acids are NOT DETECTED.  The SARS-CoV-2 RNA is generally detectable in upper respiratory specimens during the acute phase of infection. The lowest concentration of SARS-CoV-2 viral copies this assay can detect is 138 copies/mL. A negative result does not preclude SARS-Cov-2 infection and should not be used as the sole basis for treatment or other patient management decisions. A negative result may occur with  improper specimen collection/handling, submission of specimen other than nasopharyngeal swab, presence of viral mutation(s) within  the areas targeted by this assay, and inadequate number of viral copies(<138 copies/mL). A negative result must be combined with clinical observations, patient history, and epidemiological information. The expected result is Negative.  Fact Sheet for Patients:  BloggerCourse.com  Fact Sheet for Healthcare Providers:  SeriousBroker.it  This test is no t yet approved or cleared by the [place] FDA and  has been authorized for detection and/or diagnosis of SARS-CoV-2 by FDA under an Emergency Use Authorization (EUA). This EUA will remain  in effect (meaning this test can be used) for the duration of the COVID-19 declaration under Section 564(b)(1) of the Act, 21 U.S.C.section 360bbb-3(b)(1), unless the  authorization is terminated  or revoked sooner.       Influenza A by PCR NEGATIVE NEGATIVE Final   Influenza B by PCR NEGATIVE NEGATIVE Final    Comment: (NOTE) The Xpert Xpress SARS-CoV-2/FLU/RSV plus assay is intended as an aid in the diagnosis of influenza from Nasopharyngeal swab specimens and should not be used as a sole basis for treatment. Nasal washings and aspirates are unacceptable for Xpert Xpress SARS-CoV-2/FLU/RSV testing.  Fact Sheet for Patients: BloggerCourse.com  Fact Sheet for Healthcare Providers: SeriousBroker.it  This test is not yet approved or cleared by the [place] FDA and has been authorized for detection and/or diagnosis of SARS-CoV-2 by FDA under an Emergency Use Authorization (EUA). This EUA will remain in effect (meaning this test can be used) for the duration of the COVID-19 declaration under Section 564(b)(1) of the Act, 21 U.S.C. section 360bbb-3(b)(1), unless the authorization is terminated or revoked.  Performed at [facility], [address] [place], [place] [address]   Aerobic Culture w Gram Stain (superficial specimen)     Status: None (Preliminary result)   Collection Time: 03/07/21  1:24 PM   Specimen: PATH Other; Tissue  Result Value Ref Range Status   Specimen Description WOUND  Final   Special Requests RIGHT LEG WOUND SPEC A  Final   Gram Stain   Final    RARE WBC PRESENT,BOTH PMN AND MONONUCLEAR RARE GRAM NEGATIVE RODS    Culture   Final    FEW GRAM NEGATIVE RODS CULTURE REINCUBATED FOR BETTER GROWTH Performed at [facility], [address], [place], [place] [address]    Report Status PENDING  Incomplete      Radiology Studies: No results found.  Scheduled Meds:  aspirin EC  81 mg Oral Daily   Buprenorphine HCl  300 mcg Buccal BID   clopidogrel  75 mg Oral Q breakfast   docusate sodium  100 mg Oral BID   DULoxetine  30 mg Oral Daily   heparin   5,000 Units Subcutaneous Q8H   nicotine  21 mg Transdermal Daily   pantoprazole  40 mg Oral Daily   potassium chloride  20-40 mEq Oral Once   pregabalin  300 mg Oral Daily   rosuvastatin  10 mg Oral Daily   sodium chloride flush  3 mL Intravenous Q12H   Continuous Infusions:  sodium chloride 250 mL (03/08/21 2337)   piperacillin-tazobactam (ZOSYN)  IV 3.375 g (03/09/21 0600)   vancomycin 1,750 mg (03/08/21 2339)     LOS: 2 days   Time spent:  Azucena Fallen, DO [facility]  If 7PM-7AM, please contact night-coverage [email or web address]  03/09/2021, 7:26 AM

## 2021-03-09 NOTE — Progress Notes (Addendum)
  Progress Note    03/09/2021 7:21 AM 2 Days Post-Op  Subjective:  says his leg feels better; no pain in his foot  Afebrile x 24 hrs  Vitals:   03/08/21 2327 03/09/21 0430  BP: 115/70 114/67  Pulse: 91 81  Resp: 18 12  Temp: 98.1 F (36.7 C) 98.4 F (36.9 C)  SpO2: 98% 100%    Physical Exam: Cardiac:  regular Lungs:  non labored Incisions:  wound vac in place with good seal Extremities:  brisk right DP/PT doppler signals   CBC    Component Value Date/Time   WBC 9.1 03/09/2021 0622   RBC 2.84 (L) 03/09/2021 0622   HGB 7.1 (L) 03/09/2021 0622   HCT 22.9 (L) 03/09/2021 0622   PLT 375 03/09/2021 0622   MCV 80.6 03/09/2021 0622   MCH 25.0 (L) 03/09/2021 0622   MCHC 31.0 03/09/2021 0622   RDW 15.9 (H) 03/09/2021 0622   LYMPHSABS 4.6 (H) 03/06/2021 2320   MONOABS 1.0 03/06/2021 2320   EOSABS 0.2 03/06/2021 2320   BASOSABS 0.0 03/06/2021 2320    BMET    Component Value Date/Time   NA 139 03/09/2021 0622   K 3.6 03/09/2021 0622   CL 108 03/09/2021 0622   CO2 23 03/09/2021 0622   GLUCOSE 124 (H) 03/09/2021 0622   BUN 8 03/09/2021 0622   CREATININE 0.83 03/09/2021 0622   CALCIUM 8.2 (L) 03/09/2021 0622   GFRNONAA >60 03/09/2021 0622   GFRAA >60 02/13/2020 0058    INR    Component Value Date/Time   INR 1.0 02/26/2021 1200     Intake/Output Summary (Last 24 hours) at 03/09/2021 0721 Last data filed at 03/09/2021 0603 Gross per 24 hour  Intake 240 ml  Output 1330 ml  Net -1090 ml   Component 2 d ago   Specimen Description WOUND   Special Requests RIGHT LEG WOUND SPEC A   Gram Stain RARE WBC PRESENT,BOTH PMN AND MONONUCLEAR  RARE GRAM NEGATIVE RODS   Culture FEW GRAM NEGATIVE RODS  CULTURE REINCUBATED FOR BETTER GROWTH  Performed at [facility], [address], [place], [place] [address]   Report Status PENDING      Assessment/Plan:  50 y.o. male is s/p:  thrombectomy of right superficial femoral to tibioperoneal trunk bypass 02/27/21 and  posterior tibial angioplasty 03/01/21. And I&D right leg with placement of VAC dressing  2 Days Post-Op   -pt with brisk doppler signals right foot.  Wound vac with good seal-will need changing tomorrow -cx with few GNR-re-incubated for better growth.  Continue broad spectrum abx for now. -acute blood loss anemia-transfusion per primary team -DVT prophylaxis:  sq heparin  -mobilize -TOC and face to face ordered for [facility] and vac   Doreatha Massed, PA-C [facility] [phone] 03/09/2021 7:21 AM  I have seen and evaluated the patient. I agree with the PA note as documented above.  Brisk Doppler signals in the right foot.  Wound VAC with good seal.  Next wound VAC change tomorrow.  Cultures growing gram-negative rods and will continue Vanc Zosyn for broad-spectrum coverage until cultures finalized.  Cephus Shelling, MD [facility] Office: [phone]

## 2021-03-09 NOTE — Consult Note (Signed)
WOC Nurse Consult Note: Reason for Consult: NPWT dressing changes  03/10/21. Wound type: Surgical.  Placement of NPWT device intraoperatively on 03/07/21 by Dr. Darrick Penna.  First dressing change due on Monday, 03/10/21.  WOC nurse requests 2 Medium NPWT dressing kits to the room on Sunday, 7/17 in anticipation of wound care on Monday.  WOC nursing team will follow, and will remain available to this patient, the nursing and medical teams.     Thanks, Ladona Mow, MSN, RN, GNP, [place]  Pager# [phone]

## 2021-03-09 NOTE — Plan of Care (Signed)
  Problem: Education: Goal: Knowledge of General Education information will improve Description: Including pain rating scale, medication(s)/side effects and non-pharmacologic comfort measures Outcome: Progressing   Problem: Nutrition: Goal: Adequate nutrition will be maintained Outcome: Progressing   Problem: Coping: Goal: Level of anxiety will decrease Outcome: Progressing   Problem: Clinical Measurements: Goal: Respiratory complications will improve Outcome: Progressing

## 2021-03-10 ENCOUNTER — Encounter (HOSPITAL_COMMUNITY): Payer: Self-pay | Admitting: Vascular Surgery

## 2021-03-10 LAB — AEROBIC CULTURE W GRAM STAIN (SUPERFICIAL SPECIMEN)

## 2021-03-10 NOTE — Progress Notes (Signed)
Mobility Specialist: Progress Note   03/10/21 1814  Mobility  Activity Sat and stood x 3  Level of Assistance Modified independent, requires aide device or extra time  Assistive Device Front wheel walker  Mobility Out of bed to chair with meals  Mobility Response Tolerated well  Mobility performed by Mobility specialist  $Mobility charge 1 Mobility   Attempted to ambulate pt but pt unable to don prosthesis even with assistance, RN notified. Pt was able to perform STS x3 and then returned to bed. Will f/u as able.   [facility] Joven Mom Mobility Specialist Mobility Specialist Phone: [phone]

## 2021-03-10 NOTE — Progress Notes (Signed)
PROGRESS NOTE    Sean [name]  ZOX:[id number] DOB: 06/21/1971 DOA: 03/06/2021 PCP: Virgilio Bellingsborne, Tirrany T, PA-C   Brief Narrative:  Sean [name] is a 50 y.o. male with medical history significant for psoriasis, hypertension, chronic pain, current smoker, and peripheral arterial disease with history of left AKA and recent right superficial femoral artery to tibioperoneal trunk bypass, now presenting to the emergency department for evaluation of wound dehiscence and purulent drainage. Vascular surgery was consulted by the ED physician and medical admission was recommended.  Patient was treated with vancomycin and Zosyn in the ED.  Assessment & Plan:   Principal Problem:   Wound infection Active Problems:   PAD (peripheral artery disease) (HCC)   Chronic pain syndrome   Psoriasis   Hypertension   Peripheral arterial disease (HCC)   Ascending aortic aneurysm (HCC)   Right lower lobe consolidation (HCC)   Normocytic anemia   Sepsis secondary to right lower extremity infection/cellulitis, POA  -Patient underwent right superficial femoral to tibioperoneal trunk bypass 02/27/2021 with posterior tibial angioplasty 03/01/2021, presents with wound dehiscence and superficial infection -Vascular following, patient to the OR 03/07/2021 for washout and wound VAC placement -tolerated procedure well, pain currently well controlled -Continue broad-spectrum antibiotics including Zosyn, vancomycin -hoping to de-escalate in the next 24 to 48 hours pending cultures preliminary showing E. coli and staph aureus   PAD, severe - Status-post left AKA June 2021 and right SFA to tibioperoneal trunk bypass 01/23/21 with bypass thrombectomy on 7/6 and outflow stenosis angioplasty 02/28/21  - Continue ASA, Plavix, and statin; continue smoking cessation efforts     Essential hypertension - BP low-normal in ED and antihypertensives held on admission     Acute on chronic normocytic anemia -Hemoglobin within normal limits  earlier this year some 6 months ago  -Labs have been downtrending slowly over months, concern for malnutrition and iron deficiency -Follow morning labs, transfuse if hemoglobin less than 7 or symptomatic   Chronic RLL consolidation  - Noted incidentally on CT from 01/22/21  - No respiratory symptoms  - Planned for repeat imaging in 2 weeks -we will repeat if still in house   Ascending aortic aneurysm  - Continue CT surgery follow-up with biannual imaging; no further work-up warranted   Psoriasis  - Well-controlled on Humira, last injection 02/21/21  - Continue as-needed clobetasol     Chronic pain  - Continue home regimen with Lyrica, Cymbalta, buprenorphine      DVT prophylaxis: Lovenox  Code Status: Full  Family Communication: None present  Status is: Inpatient  Dispo: The patient is from: Home              Anticipated d/c is to: Same              Anticipated d/c date is: 24-48 hours              Patient currently not medically stable for discharge  Consultants:  Vascular surgery  Procedures:  Surgical wound washout and wound VAC placement 03/07/2021  Antimicrobials:  Vanco, Zosyn  Subjective: No acute issues or events reported overnight, wound VAC exchanged this morning without difficulty denies nausea vomiting diarrhea constipation headache fevers or chills.  Objective: Vitals:   03/09/21 1743 03/09/21 1939 03/09/21 2300 03/10/21 0302  BP: 103/63 (!) 113/94 124/84 105/72  Pulse: 95 90 98 80  Resp: 20 20 16 17   Temp: 98.5 F (36.9 C) 98.5 F (36.9 C) 98.4 F (36.9 C) 98.4 F (36.9 C)  TempSrc: Oral Oral  Oral Oral  SpO2: 98% 98% 97% 97%  Height:        Intake/Output Summary (Last 24 hours) at 03/10/2021 0805 Last data filed at 03/10/2021 0400 Gross per 24 hour  Intake 3608.3 ml  Output 2125 ml  Net 1483.3 ml    There were no vitals filed for this visit.  Examination:  General:  Pleasantly resting in bed, No acute distress. HEENT:  Normocephalic  atraumatic.  Sclerae nonicteric, noninjected.  Extraocular movements intact bilaterally. Neck:  Without mass or deformity.  Trachea is midline. Lungs:  Clear to auscultate bilaterally without rhonchi, wheeze, or rales. Heart:  Regular rate and rhythm.  Without murmurs, rubs, or gallops. Abdomen:  Soft, nontender, nondistended.  Without guarding or rebound. Extremities: Left AKA, right wound VAC clean dry intact Vascular:  Dorsalis pedis and posterior tibial pulses weak palpable bilaterally. Skin: Right lower extremity wound VAC intact draining scant dark red fluid   Data Reviewed: I have personally reviewed following labs and imaging studies  CBC: Recent Labs  Lab 03/06/21 2320 03/08/21 0539 03/09/21 0622  WBC 14.5* 10.0 9.1  NEUTROABS 8.6*  --   --   HGB 8.7* 7.6* 7.1*  HCT 27.4* 23.7* 22.9*  MCV 82.0 80.9 80.6  PLT 413* 402* 375    Basic Metabolic Panel: Recent Labs  Lab 03/06/21 2320 03/08/21 0539 03/09/21 0622  NA 135 138 139  K 3.8 4.0 3.6  CL 100 105 108  CO2 26 23 23   GLUCOSE 161* 139* 124*  BUN 11 11 8   CREATININE 1.04 0.96 0.83  CALCIUM 8.7* 8.7* 8.2*    GFR: Estimated Creatinine Clearance: 170.2 mL/min (by C-G formula based on SCr of 0.83 mg/dL). Liver Function Tests: Recent Labs  Lab 03/06/21 2320  AST 14*  ALT 18  ALKPHOS 60  BILITOT 0.6  PROT 6.3*  ALBUMIN 3.2*    No results for input(s): LIPASE, AMYLASE in the last 168 hours. No results for input(s): AMMONIA in the last 168 hours. Coagulation Profile: No results for input(s): INR, PROTIME in the last 168 hours. Cardiac Enzymes: No results for input(s): CKTOTAL, CKMB, CKMBINDEX, TROPONINI in the last 168 hours. BNP (last 3 results) No results for input(s): PROBNP in the last 8760 hours. HbA1C: No results for input(s): HGBA1C in the last 72 hours. CBG: No results for input(s): GLUCAP in the last 168 hours. Lipid Profile: No results for input(s): CHOL, HDL, LDLCALC, TRIG, CHOLHDL,  LDLDIRECT in the last 72 hours. Thyroid Function Tests: No results for input(s): TSH, T4TOTAL, FREET4, T3FREE, THYROIDAB in the last 72 hours. Anemia Panel: No results for input(s): VITAMINB12, FOLATE, FERRITIN, TIBC, IRON, RETICCTPCT in the last 72 hours. Sepsis Labs: No results for input(s): PROCALCITON, LATICACIDVEN in the last 168 hours.  Recent Results (from the past 240 hour(s))  Resp Panel by RT-PCR (Flu A&B, Covid) Nasopharyngeal Swab     Status: None   Collection Time: 03/07/21  2:55 AM   Specimen: Nasopharyngeal Swab; Nasopharyngeal(NP) swabs in vial transport medium  Result Value Ref Range Status   SARS Coronavirus 2 by RT PCR NEGATIVE NEGATIVE Final    Comment: (NOTE) SARS-CoV-2 target nucleic acids are NOT DETECTED.  The SARS-CoV-2 RNA is generally detectable in upper respiratory specimens during the acute phase of infection. The lowest concentration of SARS-CoV-2 viral copies this assay can detect is 138 copies/mL. A negative result does not preclude SARS-Cov-2 infection and should not be used as the sole basis for treatment or other patient management decisions. A  negative result may occur with  improper specimen collection/handling, submission of specimen other than nasopharyngeal swab, presence of viral mutation(s) within the areas targeted by this assay, and inadequate number of viral copies(<138 copies/mL). A negative result must be combined with clinical observations, patient history, and epidemiological information. The expected result is Negative.  Fact Sheet for Patients:  BloggerCourse.com  Fact Sheet for Healthcare Providers:  SeriousBroker.it  This test is no t yet approved or cleared by the [place] FDA and  has been authorized for detection and/or diagnosis of SARS-CoV-2 by FDA under an Emergency Use Authorization (EUA). This EUA will remain  in effect (meaning this test can be used) for the  duration of the COVID-19 declaration under Section 564(b)(1) of the Act, 21 U.S.C.section 360bbb-3(b)(1), unless the authorization is terminated  or revoked sooner.       Influenza A by PCR NEGATIVE NEGATIVE Final   Influenza B by PCR NEGATIVE NEGATIVE Final    Comment: (NOTE) The Xpert Xpress SARS-CoV-2/FLU/RSV plus assay is intended as an aid in the diagnosis of influenza from Nasopharyngeal swab specimens and should not be used as a sole basis for treatment. Nasal washings and aspirates are unacceptable for Xpert Xpress SARS-CoV-2/FLU/RSV testing.  Fact Sheet for Patients: BloggerCourse.com  Fact Sheet for Healthcare Providers: SeriousBroker.it  This test is not yet approved or cleared by the [place] FDA and has been authorized for detection and/or diagnosis of SARS-CoV-2 by FDA under an Emergency Use Authorization (EUA). This EUA will remain in effect (meaning this test can be used) for the duration of the COVID-19 declaration under Section 564(b)(1) of the Act, 21 U.S.C. section 360bbb-3(b)(1), unless the authorization is terminated or revoked.  Performed at [facility], [address], [place], [place] [address]   Aerobic Culture w Gram Stain (superficial specimen)     Status: None (Preliminary result)   Collection Time: 03/07/21  1:24 PM   Specimen: PATH Other; Tissue  Result Value Ref Range Status   Specimen Description WOUND  Final   Special Requests RIGHT LEG WOUND SPEC A  Final   Gram Stain   Final    RARE WBC PRESENT,BOTH PMN AND MONONUCLEAR RARE GRAM NEGATIVE RODS    Culture   Final    FEW ESCHERICHIA COLI RARE STAPHYLOCOCCUS AUREUS SUSCEPTIBILITIES TO FOLLOW Performed at [facility], [address] [place], [place] [address]    Report Status PENDING  Incomplete   Organism ID, Bacteria ESCHERICHIA COLI  Final      Susceptibility   Escherichia coli - MIC*    AMPICILLIN >=32 RESISTANT  Resistant     CEFAZOLIN <=4 SENSITIVE Sensitive     CEFEPIME <=0.12 SENSITIVE Sensitive     CEFTAZIDIME <=1 SENSITIVE Sensitive     CEFTRIAXONE <=0.25 SENSITIVE Sensitive     CIPROFLOXACIN <=0.25 SENSITIVE Sensitive     GENTAMICIN <=1 SENSITIVE Sensitive     IMIPENEM <=0.25 SENSITIVE Sensitive     TRIMETH/SULFA <=20 SENSITIVE Sensitive     AMPICILLIN/SULBACTAM >=32 RESISTANT Resistant     PIP/TAZO <=4 SENSITIVE Sensitive     * FEW ESCHERICHIA COLI      Radiology Studies: No results found.  Scheduled Meds:  aspirin EC  81 mg Oral Daily   Buprenorphine HCl  300 mcg Buccal BID   clopidogrel  75 mg Oral Q breakfast   docusate sodium  100 mg Oral BID   DULoxetine  30 mg Oral Daily   heparin  5,000 Units Subcutaneous  Q8H   nicotine  21 mg Transdermal Daily   pantoprazole  40 mg Oral Daily   potassium chloride  20-40 mEq Oral Once   pregabalin  300 mg Oral Daily   rosuvastatin  10 mg Oral Daily   sodium chloride flush  3 mL Intravenous Q12H   Continuous Infusions:  sodium chloride 10 mL/hr at 03/10/21 0400   piperacillin-tazobactam (ZOSYN)  IV 3.375 g (03/10/21 0430)   vancomycin Stopped (03/09/21 2225)     LOS: 3 days   Time spent:  Azucena Fallen, DO [facility]  If 7PM-7AM, please contact night-coverage [email or web address]  03/10/2021, 8:05 AM

## 2021-03-10 NOTE — TOC Initial Note (Signed)
Transition of Care (TOC) - Initial/Assessment Note  Donn Pierini RN, BSN Transitions of Care Unit 4E- RN Case Manager See Treatment Team for direct phone #    Patient Details  Name: Sean [name] MRN: [id number] Date of Birth: [birth year]  Transition of Care [facility]) CM/SW Contact:    Darrold Span, RN Phone Number: 03/10/2021, 12:01 PM  Clinical Narrative:                 Referral received for home wound VAC needs. [employer] home wound VAC order form has been signed and faxed to [facility]- spoke with French Ana at [facility] regarding home VAC needs- she will start insurance approval today and plan to release vac for delivery once approved.   CM spoke with pt at bedside for home health needs- per pt he is already active with [facility] and would like to continue services with them. Discussed home VAC approval process, and DME needs- Per pt he has needed DME at home including RW, BSC, tub bench and lift chair.   Call made to [facility] with Frances Furbish to confirm [facility] services- pt had HHRN/PT services prior to admit- advised Kandee Keen about home wound VAC needs- they will follow on re-start needs for [facility] drsg changes.   TOC to continue to follow for further transition needs, and f//u on home wound VAC.   Expected Discharge Plan: Home w Home Health Services Barriers to Discharge: Continued Medical Work up   Patient Goals and CMS Choice Patient states their goals for this hospitalization and ongoing recovery are:: return home CMS Medicare.gov Compare Post Acute Care list provided to:: Patient Choice offered to / list presented to : Patient  Expected Discharge Plan and Services Expected Discharge Plan: Home w Home Health Services   Discharge Planning Services: CM Consult Post Acute Care Choice: Home Health, Durable Medical Equipment, Resumption of Svcs/PTA Provider Living arrangements for the past 2 months: Single Family Home                 DME Arranged: Vac DME Agency: [employer] Date DME Agency Contacted:  03/10/21 Time DME Agency Contacted: 1000 Representative spoke with at DME Agency: French Ana HH Arranged: RN, PT HH Agency: [facility] Date [facility] Agency Contacted: 03/10/21 Time HH Agency Contacted: 1200 Representative spoke with at [facility] Agency: Kandee Keen  Prior Living Arrangements/Services Living arrangements for the past 2 months: Single Family Home Lives with:: Spouse Patient language and need for interpreter reviewed:: Yes Do you feel safe going back to the place where you live?: Yes      Need for Family Participation in Patient Care: Yes (Comment) Care giver support system in place?: Yes (comment) Current home services: DME (Prothesis, RW, BSC, tub bench, lift chair) Criminal Activity/Legal Involvement Pertinent to Current Situation/Hospitalization: No - Comment as needed  Activities of Daily Living      Permission Sought/Granted Permission sought to share information with : Oceanographer granted to share information with : Yes, Verbal Permission Granted     Permission granted to share info w AGENCY: HH and DME        Emotional Assessment Appearance:: Appears stated age Attitude/Demeanor/Rapport: Engaged Affect (typically observed): Appropriate, Accepting, Pleasant Orientation: : Oriented to Self, Oriented to Place, Oriented to  Time, Oriented to Situation Alcohol / Substance Use: Not Applicable Psych Involvement: No (comment)  Admission diagnosis:  Wound dehiscence [T81.30XA] Postoperative infection, unspecified type, initial encounter [T81.40XA] PAD (peripheral artery disease) (HCC) [I73.9] Patient Active Problem List  Diagnosis Date Noted   Wound infection 03/07/2021   Ascending aortic aneurysm (HCC) 03/07/2021   Right lower lobe consolidation (HCC) 03/07/2021   Normocytic anemia 03/07/2021   Postoperative infection    Wound dehiscence    Peripheral arterial disease (HCC) 02/26/2021   Vitamin B12 deficiency (non anemic) 01/21/2021    Dyslipidemia 01/15/2021   Hypertension    Class 2 severe obesity due to excess calories with serious comorbidity and body mass index (BMI) of 35.0 to 35.9 in adult [facility]) 12/10/2020   Tobacco use disorder 12/10/2020   Urinary urgency 12/10/2020   Weak urinary stream 12/10/2020   Preop cardiovascular exam 12/05/2020   Other long term (current) drug therapy 12/05/2020   Tinea cruris 12/05/2020   Erectile dysfunction due to diseases classified elsewhere 04/06/2020   AKA stump complication (HCC) 02/13/2020   Chronic pain syndrome 02/13/2020   Neuropathic pain 11/10/2019   Phantom limb syndrome with pain (HCC) 09/12/2019   History of left below knee amputation (HCC) 05/16/2019   Impaired mobility and ADLs 05/16/2019   Ischemia of left lower extremity 04/28/2019   PAD (peripheral artery disease) (HCC) 04/28/2019   Ischemia of extremity 09/21/2018   PVD (peripheral vascular disease) (HCC) 09/20/2018   Acquired deformity of right hand 11/27/2015   Essential hypertension 11/27/2015   Psoriasis 11/27/2015   Vitamin D deficiency 11/27/2015   Arthritis 05/06/2014   Carpal tunnel syndrome 05/06/2014   Dislocation of distal interphalangeal (DIP) joint of finger 05/06/2014   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 [place] [address] Phone: [phone] Fax: [phone]  [facility] [address] [place] [address] Phone: [phone] Fax: [phone]  [facility] - [place], [place] - [address] [place] [address] Phone: [phone] Fax: [phone]     Social Determinants of Health (SDOH) Interventions    Readmission Risk Interventions Readmission Risk Prevention Plan 01/27/2021  Post Dischage Appt Complete  Medication Screening Complete  Transportation  Screening Complete  Some recent data might be hidden

## 2021-03-10 NOTE — Progress Notes (Addendum)
  Progress Note    03/10/2021 7:50 AM 3 Days Post-Op  Subjective:  No complaints   Vitals:   03/09/21 2300 03/10/21 0302  BP: 124/84 105/72  Pulse: 98 80  Resp: 16 17  Temp: 98.4 F (36.9 C) 98.4 F (36.9 C)  SpO2: 97% 97%   Physical Exam: Lungs:  non labored Incisions:  R calf wound with granulation  Extremities:  R ATA and PT by doppler Neurologic: A&O     CBC    Component Value Date/Time   WBC 9.1 03/09/2021 0622   RBC 2.84 (L) 03/09/2021 0622   HGB 7.1 (L) 03/09/2021 0622   HCT 22.9 (L) 03/09/2021 0622   PLT 375 03/09/2021 0622   MCV 80.6 03/09/2021 0622   MCH 25.0 (L) 03/09/2021 0622   MCHC 31.0 03/09/2021 0622   RDW 15.9 (H) 03/09/2021 0622   LYMPHSABS 4.6 (H) 03/06/2021 2320   MONOABS 1.0 03/06/2021 2320   EOSABS 0.2 03/06/2021 2320   BASOSABS 0.0 03/06/2021 2320    BMET    Component Value Date/Time   NA 139 03/09/2021 0622   K 3.6 03/09/2021 0622   CL 108 03/09/2021 0622   CO2 23 03/09/2021 0622   GLUCOSE 124 (H) 03/09/2021 0622   BUN 8 03/09/2021 0622   CREATININE 0.83 03/09/2021 0622   CALCIUM 8.2 (L) 03/09/2021 0622   GFRNONAA >60 03/09/2021 0622   GFRAA >60 02/13/2020 0058    INR    Component Value Date/Time   INR 1.0 02/26/2021 1200     Intake/Output Summary (Last 24 hours) at 03/10/2021 0750 Last data filed at 03/10/2021 0400 Gross per 24 hour  Intake 3608.3 ml  Output 2125 ml  Net 1483.3 ml     Assessment/Plan:  50 y.o. male is s/p I&D R calf wound 3 Days Post-Op   R foot well perfused by doppler exam R calf wound with granulation tissue, no further purulence TOC arranged HHRN for vac changes Awaiting final culture results [facility] for d/c home pending the above    Emilie Rutter, PA-C [facility] [phone] 03/10/2021 7:50 AM  I have independently interviewed and examined patient and agree with PA assessment and plan above. Wound vac has been changed and images reviewed. F/u cultures to tailor  antibiotics.   Karry Barrilleaux C. Randie Heinz, MD [facility] [place] Office: [phone] Pager: [phone]

## 2021-03-10 NOTE — Anesthesia Postprocedure Evaluation (Signed)
Anesthesia Post Note  Patient: Sean [name]  Procedure(s) Performed: IRRIGATION AND DEBRIDEMENT CALF (Right) APPLICATION OF WOUND VAC (Right: Leg Lower)     Patient location during evaluation: PACU Anesthesia Type: General Level of consciousness: awake and alert Pain management: pain level controlled Vital Signs Assessment: post-procedure vital signs reviewed and stable Respiratory status: spontaneous breathing, nonlabored ventilation, respiratory function stable and patient connected to nasal cannula oxygen Cardiovascular status: blood pressure returned to baseline and stable Postop Assessment: no apparent nausea or vomiting Anesthetic complications: no   No notable events documented.  Last Vitals:  Vitals:   03/10/21 0302 03/10/21 0900  BP: 105/72 (!) 126/94  Pulse: 80 80  Resp: 17 20  Temp: 36.9 C 36.6 C  SpO2: 97% 97%    Last Pain:  Vitals:   03/10/21 0900  TempSrc: Oral  PainSc:                  Nastasha Reising S

## 2021-03-10 NOTE — Consult Note (Signed)
WOC consulted for [facility] dressing changes, changed first post op dressing per [facility].  Looks like pending DC to home.  Will follow for any needs related to further dressings this week.  Nic Lampe [facility], CNS, [employer] [phone]

## 2021-03-11 ENCOUNTER — Encounter: Payer: BLUE CROSS/BLUE SHIELD | Admitting: Vascular Surgery

## 2021-03-11 LAB — CBC
HCT: 24.2 % — ABNORMAL LOW (ref 39.0–52.0)
Hemoglobin: 7.7 g/dL — ABNORMAL LOW (ref 13.0–17.0)
MCH: 25.8 pg — ABNORMAL LOW (ref 26.0–34.0)
MCHC: 31.8 g/dL (ref 30.0–36.0)
MCV: 80.9 fL (ref 80.0–100.0)
Platelets: 411 10*3/uL — ABNORMAL HIGH (ref 150–400)
RBC: 2.99 MIL/uL — ABNORMAL LOW (ref 4.22–5.81)
RDW: 16.2 % — ABNORMAL HIGH (ref 11.5–15.5)
WBC: 10.5 10*3/uL (ref 4.0–10.5)
nRBC: 0.2 % (ref 0.0–0.2)

## 2021-03-11 LAB — BASIC METABOLIC PANEL
Anion gap: 7 (ref 5–15)
BUN: <5 mg/dL — ABNORMAL LOW (ref 6–20)
CO2: 25 mmol/L (ref 22–32)
Calcium: 8.9 mg/dL (ref 8.9–10.3)
Chloride: 109 mmol/L (ref 98–111)
Creatinine, Ser: 0.98 mg/dL (ref 0.61–1.24)
GFR, Estimated: >60 mL/min (ref >60)
Glucose, Bld: 111 mg/dL — ABNORMAL HIGH (ref 70–99)
Potassium: 3.7 mmol/L (ref 3.5–5.1)
Sodium: 141 mmol/L (ref 135–145)

## 2021-03-11 MED ORDER — CEPHALEXIN 500 MG PO CAPS
1000.0000 mg | ORAL_CAPSULE | Freq: Three times a day (TID) | ORAL | Status: DC
  Administered 2021-03-11 – 2021-03-12 (×4): 1000 mg via ORAL
  Filled 2021-03-11 (×4): qty 2

## 2021-03-11 NOTE — Progress Notes (Addendum)
  Progress Note    03/11/2021 7:53 AM 4 Days Post-Op  Subjective:  No complaints   Vitals:   03/11/21 0302 03/11/21 0725  BP: 120/81 (!) 116/94  Pulse: 75   Resp: 18 18  Temp: 97.7 F (36.5 C) (!) 97.4 F (36.3 C)  SpO2: 97% 95%   Physical Exam: Lungs:  non labored Incisions:  R calf incision with vac in place, good seal Extremities:  R foot warm and well perfused Neurologic: A&O  CBC    Component Value Date/Time   WBC 10.5 03/11/2021 0632   RBC 2.99 (L) 03/11/2021 0632   HGB 7.7 (L) 03/11/2021 0632   HCT 24.2 (L) 03/11/2021 0632   PLT 411 (H) 03/11/2021 0632   MCV 80.9 03/11/2021 0632   MCH 25.8 (L) 03/11/2021 0632   MCHC 31.8 03/11/2021 0632   RDW 16.2 (H) 03/11/2021 0632   LYMPHSABS 4.6 (H) 03/06/2021 2320   MONOABS 1.0 03/06/2021 2320   EOSABS 0.2 03/06/2021 2320   BASOSABS 0.0 03/06/2021 2320    BMET    Component Value Date/Time   NA 139 03/09/2021 0622   K 3.6 03/09/2021 0622   CL 108 03/09/2021 0622   CO2 23 03/09/2021 0622   GLUCOSE 124 (H) 03/09/2021 0622   BUN 8 03/09/2021 0622   CREATININE 0.83 03/09/2021 0622   CALCIUM 8.2 (L) 03/09/2021 0622   GFRNONAA >60 03/09/2021 0622   GFRAA >60 02/13/2020 0058    INR    Component Value Date/Time   INR 1.0 02/26/2021 1200     Intake/Output Summary (Last 24 hours) at 03/11/2021 0753 Last data filed at 03/11/2021 0600 Gross per 24 hour  Intake 452.91 ml  Output 1175 ml  Net -722.09 ml     Assessment/Plan:  50 y.o. male is s/p I&D R calf wound 4 Days Post-Op   R foot warm and well perfused Continue wound vac; vac change tomorrow TOC arranging home RN and wound vac Awaiting final culture results to tailor antibiotics We will also contact [employer] to evaluate L LE prosthetic while inpatient    Sean Rutter, PA-C [facility] [phone] 03/11/2021 7:53 AM  I have independently interviewed and examined patient and agree with PA assessment and plan above. Strong PT  signal on right. Wound vac in place awaiting home vac set up. Plan to complete abx tomorrow after 5 days.   Sean [name] C. Randie Heinz, MD [facility] [place] Office: [phone] Pager: [phone]

## 2021-03-11 NOTE — TOC Progression Note (Addendum)
Transition of Care (TOC) - Progression Note  Donn Pierini RN, BSN Transitions of Care Unit 4E- RN Case Manager See Treatment Team for direct phone #    Patient Details  Name: Sean [name] MRN: [id number] Date of Birth: September 26, 1970  Transition of Care [facility]) CM/SW Contact  Zenda Alpers Lenn Sink, RN Phone Number: 03/11/2021, 11:56 AM  Clinical Narrative:    Have spoken with French Ana at [facility]- pt has been approved for home wound VAC and it has been released for delivery. Home VAC should be delivered here sometime this morning per French Ana.   Noted in vascular note that "Plan to complete abx tomorrow after 5 days"  - with this in mind it would be best for pt to have wound VAC changed here tomorrow as well, complete his abx coarse and then d/c tomorrow if medically stable with a plan for [facility] to start services on Friday for the next wound VAC drsg change. Will f/u with MD for transition plan.   1300- checked with [facility] regarding HHRN availability- per Kandee Keen they will not have an RN available for tomorrow's VAC dsrg change needs- they confirm that an RN will be available for a restart of care on Friday July 22 to do a wound VAC drsg change in the home.  Pt will need to stay in house for a wound VAC drsg change tomorrow prior to discharge then [facility] will start on Friday July 22.- home wound VAC will be delivered today. MD has been updated on transition needs.      Expected Discharge Plan: Home w Home Health Services Barriers to Discharge: Continued Medical Work up  Expected Discharge Plan and Services Expected Discharge Plan: Home w Home Health Services   Discharge Planning Services: CM Consult Post Acute Care Choice: Home Health, Durable Medical Equipment, Resumption of Svcs/PTA Provider Living arrangements for the past 2 months: Single Family Home                 DME Arranged: Vac DME Agency: [facility] Date DME Agency Contacted: 03/10/21 Time DME Agency Contacted: 1000 Representative spoke with at DME  Agency: French Ana HH Arranged: RN, [facility] Agency: [facility] Date [facility] Agency Contacted: 03/10/21 Time HH Agency Contacted: 1200 Representative spoke with at [facility] Agency: Kandee Keen   Social Determinants of Health (SDOH) Interventions    Readmission Risk Interventions Readmission Risk Prevention Plan 01/27/2021  Post Dischage Appt Complete  Medication Screening Complete  Transportation Screening Complete  Some recent data might be hidden

## 2021-03-11 NOTE — Progress Notes (Signed)
PROGRESS NOTE    Sean [name]  LMB:[id number] DOB: [birth year] DOA: 03/06/2021 PCP: Virgilio Belling, PA-C   Brief Narrative:  Sean [name] is a 50 y.o. male with medical history significant for psoriasis, hypertension, chronic pain, current smoker, and peripheral arterial disease with history of left AKA and recent right superficial femoral artery to tibioperoneal trunk bypass, now presenting to the emergency department for evaluation of wound dehiscence and purulent drainage. Vascular surgery was consulted by the ED physician and medical admission was recommended.  Patient was treated with vancomycin and Zosyn in the ED.  Assessment & Plan:   Principal Problem:   Wound infection Active Problems:   PAD (peripheral artery disease) (HCC)   Chronic pain syndrome   Psoriasis   Hypertension   Peripheral arterial disease (HCC)   Ascending aortic aneurysm (HCC)   Right lower lobe consolidation (HCC)   Normocytic anemia  Sepsis secondary to right lower extremity infection/cellulitis, POA  -Patient underwent right superficial femoral to tibioperoneal trunk bypass 02/27/2021 with posterior tibial angioplasty 03/01/2021, presents with wound dehiscence and superficial infection -Vascular following, patient to the OR 03/07/2021 for washout and wound VAC placement -tolerated procedure well, pain currently well controlled -Continue broad-spectrum antibiotics including Zosyn, vancomycin -de-escalate to cephalexin for additional 48 hours -discharge pending wound VAC and home health scheduling   PAD, severe - Status-post left AKA June 2021 and right SFA to tibioperoneal trunk bypass 01/23/21 with bypass thrombectomy on 7/6 and outflow stenosis angioplasty 02/28/21  - Continue ASA, Plavix, and statin; continue smoking cessation efforts     Essential hypertension - BP low-normal in ED and antihypertensives held on admission     Acute on chronic normocytic anemia -Hemoglobin within normal limits earlier  this year some 6 months ago  -Labs have been downtrending slowly over months, concern for malnutrition and iron deficiency -Follow morning labs, transfuse if hemoglobin less than 7 or symptomatic   Chronic RLL consolidation  - Noted incidentally on CT from 01/22/21  - No respiratory symptoms  - Planned for repeat imaging in 2 weeks -we will repeat if still in house   Ascending aortic aneurysm  - Continue CT surgery follow-up with biannual imaging; no further work-up warranted   Psoriasis  - Well-controlled on Humira, last injection 02/21/21  - Continue as-needed clobetasol     Chronic pain  - Continue home regimen with Lyrica, Cymbalta, buprenorphine      DVT prophylaxis: Lovenox  Code Status: Full  Family Communication: None present  Status is: Inpatient  Dispo: The patient is from: Home              Anticipated d/c is to: Same              Anticipated d/c date is: 24 hours, awaiting home health and wound VAC delivery              Patient currently not medically stable for discharge  Consultants:  Vascular surgery  Procedures:  Surgical wound washout and wound VAC placement 03/07/2021  Antimicrobials:  Vanco, Zosyn  Subjective: No acute issues or events reported overnight, difficulty ambulating due to left lower extremity leg edema unable to fit his prosthesis.  We discussed keeping it elevated as well as placing compression stocking over his stump in hopes to improve minimal edema.  Objective: Vitals:   03/10/21 1948 03/10/21 2345 03/11/21 0302 03/11/21 0725  BP: 120/90 (!) 103/55 120/81 (!) 116/94  Pulse: 90 70 75   Resp: 20 20 18  18  Temp: 97.7 F (36.5 C) 97.7 F (36.5 C) 97.7 F (36.5 C) (!) 97.4 F (36.3 C)  TempSrc: Oral Oral Oral Oral  SpO2: 98% 97% 97% 95%  Weight:      Height:        Intake/Output Summary (Last 24 hours) at 03/11/2021 0730 Last data filed at 03/11/2021 0600 Gross per 24 hour  Intake 452.91 ml  Output 1175 ml  Net -722.09 ml     Filed Weights   03/10/21 1809  Weight: (!) 136.5 kg    Examination:  General:  Pleasantly resting in bed, No acute distress. HEENT:  Normocephalic atraumatic.  Sclerae nonicteric, noninjected.  Extraocular movements intact bilaterally. Neck:  Without mass or deformity.  Trachea is midline. Lungs:  Clear to auscultate bilaterally without rhonchi, wheeze, or rales. Heart:  Regular rate and rhythm.  Without murmurs, rubs, or gallops. Abdomen:  Soft, nontender, nondistended.  Without guarding or rebound. Extremities: Left AKA, right wound VAC clean dry intact Vascular:  Dorsalis pedis and posterior tibial pulses weak palpable bilaterally. Skin: Right lower extremity wound VAC intact draining scant dark red fluid   Data Reviewed: I have personally reviewed following labs and imaging studies  CBC: Recent Labs  Lab 03/06/21 2320 03/08/21 0539 03/09/21 0622  WBC 14.5* 10.0 9.1  NEUTROABS 8.6*  --   --   HGB 8.7* 7.6* 7.1*  HCT 27.4* 23.7* 22.9*  MCV 82.0 80.9 80.6  PLT 413* 402* 375    Basic Metabolic Panel: Recent Labs  Lab 03/06/21 2320 03/08/21 0539 03/09/21 0622  NA 135 138 139  K 3.8 4.0 3.6  CL 100 105 108  CO2 26 23 23   GLUCOSE 161* 139* 124*  BUN 11 11 8   CREATININE 1.04 0.96 0.83  CALCIUM 8.7* 8.7* 8.2*    GFR: Estimated Creatinine Clearance: 161 mL/min (by C-G formula based on SCr of 0.83 mg/dL). Liver Function Tests: Recent Labs  Lab 03/06/21 2320  AST 14*  ALT 18  ALKPHOS 60  BILITOT 0.6  PROT 6.3*  ALBUMIN 3.2*    No results for input(s): LIPASE, AMYLASE in the last 168 hours. No results for input(s): AMMONIA in the last 168 hours. Coagulation Profile: No results for input(s): INR, PROTIME in the last 168 hours. Cardiac Enzymes: No results for input(s): CKTOTAL, CKMB, CKMBINDEX, TROPONINI in the last 168 hours. BNP (last 3 results) No results for input(s): PROBNP in the last 8760 hours. HbA1C: No results for input(s): HGBA1C in the  last 72 hours. CBG: No results for input(s): GLUCAP in the last 168 hours. Lipid Profile: No results for input(s): CHOL, HDL, LDLCALC, TRIG, CHOLHDL, LDLDIRECT in the last 72 hours. Thyroid Function Tests: No results for input(s): TSH, T4TOTAL, FREET4, T3FREE, THYROIDAB in the last 72 hours. Anemia Panel: No results for input(s): VITAMINB12, FOLATE, FERRITIN, TIBC, IRON, RETICCTPCT in the last 72 hours. Sepsis Labs: No results for input(s): PROCALCITON, LATICACIDVEN in the last 168 hours.  Recent Results (from the past 240 hour(s))  Resp Panel by RT-PCR (Flu A&B, Covid) Nasopharyngeal Swab     Status: None   Collection Time: 03/07/21  2:55 AM   Specimen: Nasopharyngeal Swab; Nasopharyngeal(NP) swabs in vial transport medium  Result Value Ref Range Status   SARS Coronavirus 2 by RT PCR NEGATIVE NEGATIVE Final    Comment: (NOTE) SARS-CoV-2 target nucleic acids are NOT DETECTED.  The SARS-CoV-2 RNA is generally detectable in upper respiratory specimens during the acute phase of infection. The lowest concentration of SARS-CoV-2 viral  copies this assay can detect is 138 copies/mL. A negative result does not preclude SARS-Cov-2 infection and should not be used as the sole basis for treatment or other patient management decisions. A negative result may occur with  improper specimen collection/handling, submission of specimen other than nasopharyngeal swab, presence of viral mutation(s) within the areas targeted by this assay, and inadequate number of viral copies(<138 copies/mL). A negative result must be combined with clinical observations, patient history, and epidemiological information. The expected result is Negative.  Fact Sheet for Patients:  BloggerCourse.com  Fact Sheet for Healthcare Providers:  SeriousBroker.it  This test is no t yet approved or cleared by the [place] FDA and  has been authorized for detection and/or  diagnosis of SARS-CoV-2 by FDA under an Emergency Use Authorization (EUA). This EUA will remain  in effect (meaning this test can be used) for the duration of the COVID-19 declaration under Section 564(b)(1) of the Act, 21 U.S.C.section 360bbb-3(b)(1), unless the authorization is terminated  or revoked sooner.       Influenza A by PCR NEGATIVE NEGATIVE Final   Influenza B by PCR NEGATIVE NEGATIVE Final    Comment: (NOTE) The Xpert Xpress SARS-CoV-2/FLU/RSV plus assay is intended as an aid in the diagnosis of influenza from Nasopharyngeal swab specimens and should not be used as a sole basis for treatment. Nasal washings and aspirates are unacceptable for Xpert Xpress SARS-CoV-2/FLU/RSV testing.  Fact Sheet for Patients: BloggerCourse.com  Fact Sheet for Healthcare Providers: SeriousBroker.it  This test is not yet approved or cleared by the [place] FDA and has been authorized for detection and/or diagnosis of SARS-CoV-2 by FDA under an Emergency Use Authorization (EUA). This EUA will remain in effect (meaning this test can be used) for the duration of the COVID-19 declaration under Section 564(b)(1) of the Act, 21 U.S.C. section 360bbb-3(b)(1), unless the authorization is terminated or revoked.  Performed at [facility], [address] [place], [place] [address]   Aerobic Culture w Gram Stain (superficial specimen)     Status: None   Collection Time: 03/07/21  1:24 PM   Specimen: PATH Other; Tissue  Result Value Ref Range Status   Specimen Description WOUND  Final   Special Requests RIGHT LEG WOUND SPEC A  Final   Gram Stain   Final    RARE WBC PRESENT,BOTH PMN AND MONONUCLEAR RARE GRAM NEGATIVE RODS Performed at [facility], [address] [place], [place] [address]    Culture FEW ESCHERICHIA COLI RARE STAPHYLOCOCCUS AUREUS   Final   Report Status 03/10/2021 FINAL  Final   Organism ID, Bacteria  ESCHERICHIA COLI  Final   Organism ID, Bacteria STAPHYLOCOCCUS AUREUS  Final      Susceptibility   Escherichia coli - MIC*    AMPICILLIN >=32 RESISTANT Resistant     CEFAZOLIN <=4 SENSITIVE Sensitive     CEFEPIME <=0.12 SENSITIVE Sensitive     CEFTAZIDIME <=1 SENSITIVE Sensitive     CEFTRIAXONE <=0.25 SENSITIVE Sensitive     CIPROFLOXACIN <=0.25 SENSITIVE Sensitive     GENTAMICIN <=1 SENSITIVE Sensitive     IMIPENEM <=0.25 SENSITIVE Sensitive     TRIMETH/SULFA <=20 SENSITIVE Sensitive     AMPICILLIN/SULBACTAM >=32 RESISTANT Resistant     PIP/TAZO <=4 SENSITIVE Sensitive     * FEW ESCHERICHIA COLI   Staphylococcus aureus - MIC*    CIPROFLOXACIN <=0.5 SENSITIVE Sensitive     ERYTHROMYCIN <=0.25 SENSITIVE Sensitive     GENTAMICIN <=0.5 SENSITIVE Sensitive  OXACILLIN <=0.25 SENSITIVE Sensitive     TETRACYCLINE <=1 SENSITIVE Sensitive     VANCOMYCIN <=0.5 SENSITIVE Sensitive     TRIMETH/SULFA <=10 SENSITIVE Sensitive     CLINDAMYCIN <=0.25 SENSITIVE Sensitive     RIFAMPIN <=0.5 SENSITIVE Sensitive     Inducible Clindamycin NEGATIVE Sensitive     * RARE STAPHYLOCOCCUS AUREUS      Radiology Studies: No results found.  Scheduled Meds:  aspirin EC  81 mg Oral Daily   Buprenorphine HCl  300 mcg Buccal BID   clopidogrel  75 mg Oral Q breakfast   docusate sodium  100 mg Oral BID   DULoxetine  30 mg Oral Daily   heparin  5,000 Units Subcutaneous Q8H   nicotine  21 mg Transdermal Daily   pantoprazole  40 mg Oral Daily   potassium chloride  20-40 mEq Oral Once   pregabalin  300 mg Oral Daily   rosuvastatin  10 mg Oral Daily   sodium chloride flush  3 mL Intravenous Q12H   Continuous Infusions:  sodium chloride 10 mL/hr at 03/10/21 0400   piperacillin-tazobactam (ZOSYN)  IV 3.375 g (03/11/21 0409)   vancomycin 1,750 mg (03/10/21 2150)     LOS: 4 days   Time spent:  Azucena Fallen, DO [facility]  If 7PM-7AM, please contact  night-coverage [email or web address]  03/11/2021, 7:30 AM

## 2021-03-11 NOTE — Progress Notes (Signed)
Plan to continue abx until tomorrow per [facility]. Wound cx came back with e.coli that is pan sensitive except for ampicillin/Unasyn and MSSA. D/w Dr Natale Milch and we will change his abx to Keflex for another day. Since he is a big pt, we will use a higher than normal dose of keflex.  Keflex 1g PO TID through 7/20  Sean [name], PharmD, [place], AAHIVP, CPP Infectious Disease Pharmacist 03/11/2021 12:09 PM

## 2021-03-12 ENCOUNTER — Ambulatory Visit: Payer: BLUE CROSS/BLUE SHIELD

## 2021-03-12 ENCOUNTER — Encounter (HOSPITAL_COMMUNITY): Payer: BLUE CROSS/BLUE SHIELD

## 2021-03-12 ENCOUNTER — Emergency Department (HOSPITAL_COMMUNITY)
Admission: EM | Admit: 2021-03-12 | Discharge: 2021-03-24 | Disposition: E | Payer: BLUE CROSS/BLUE SHIELD | Attending: Emergency Medicine | Admitting: Emergency Medicine

## 2021-03-12 DIAGNOSIS — I10 Essential (primary) hypertension: Secondary | ICD-10-CM | POA: Insufficient documentation

## 2021-03-12 DIAGNOSIS — R140 Abdominal distension (gaseous): Secondary | ICD-10-CM | POA: Insufficient documentation

## 2021-03-12 DIAGNOSIS — Z7982 Long term (current) use of aspirin: Secondary | ICD-10-CM | POA: Insufficient documentation

## 2021-03-12 DIAGNOSIS — Z7902 Long term (current) use of antithrombotics/antiplatelets: Secondary | ICD-10-CM | POA: Insufficient documentation

## 2021-03-12 DIAGNOSIS — Z79899 Other long term (current) drug therapy: Secondary | ICD-10-CM | POA: Diagnosis not present

## 2021-03-12 DIAGNOSIS — I469 Cardiac arrest, cause unspecified: Secondary | ICD-10-CM | POA: Diagnosis not present

## 2021-03-12 DIAGNOSIS — F17210 Nicotine dependence, cigarettes, uncomplicated: Secondary | ICD-10-CM | POA: Diagnosis not present

## 2021-03-12 MED ORDER — AMLODIPINE BESYLATE 5 MG PO TABS: 5.0000 mg | ORAL_TABLET | Freq: Every morning | ORAL | 0 refills | Status: AC

## 2021-03-12 MED ORDER — OXYCODONE-ACETAMINOPHEN 5-325 MG PO TABS: 1.0000 | ORAL_TABLET | ORAL | 0 refills | Status: AC | PRN

## 2021-03-12 MED ORDER — DEXTROSE 5 % IV SOLN
INTRAVENOUS | Status: AC | PRN
  Administered 2021-03-12: 150 mg via INTRAVENOUS

## 2021-03-12 MED ORDER — EPINEPHRINE 1 MG/10ML IJ SOSY
PREFILLED_SYRINGE | INTRAMUSCULAR | Status: AC | PRN
  Administered 2021-03-12 (×5): 0.1 mg via INTRAVENOUS

## 2021-03-21 ENCOUNTER — Encounter (HOSPITAL_COMMUNITY): Payer: BLUE CROSS/BLUE SHIELD

## 2021-03-21 ENCOUNTER — Ambulatory Visit: Payer: BLUE CROSS/BLUE SHIELD | Admitting: Vascular Surgery

## 2021-03-24 NOTE — Progress Notes (Signed)
   Apr 01, 2021 1800  Clinical Encounter Type  Visited With Patient and family together  Visit Type Death  Referral From Nurse  Consult/Referral To Chaplain   Chaplain was informed by the patient's nurse of his death and that his wife will need assistance. His wife introduced herself as Environmental health practitioner. I provided emotional grief support and offered prayer. She did not have other supporting family members as they live in [place] and her sister lives in [place]. She stated she did not know how to explain his death to his daughter or her children. They have been married for 17 years. I escorted Ms. Sean [name] to say her goodbyes and she took the bag with his belongings. I explained the Patient Placement card. She was unable to recall the name of the funeral home. She will call patient placement tomorrow. This note was prepared by Deneen Harts, M.Div..  For questions please contact by phone [phone].

## 2021-03-24 NOTE — Progress Notes (Signed)
Pt being D/C, education provided, wound vac assembled for pt home use, IV removed, No telebox to return.   Kalman Jewels, RN 2021-03-30. 12:36 PM

## 2021-03-24 NOTE — ED Triage Notes (Addendum)
Pt BIB EMS from home due to resp distress. Pt was d/c from hospital. Pt coded per EMS en route. CPR in progress at this time. Per EMS pt was 70% RA.Marland [facility] EMS gave 4 epi en route . 5 given in ER

## 2021-03-24 NOTE — Progress Notes (Addendum)
  Progress Note    April 03, 2021 8:22 AM 5 Days Post-Op  Subjective:  no complaints   Vitals:   April 03, 2021 0320 Apr 03, 2021 0743  BP: 102/63 (!) 132/92  Pulse: 75 86  Resp: 17 18  Temp: 97.7 F (36.5 C) 98.5 F (36.9 C)  SpO2: 100% 99%   Physical Exam: Cardiac:  regular Lungs:  non labored Incisions:  right medial calf incision well appearing with beefy red granulation tissue. Some maceration around edges. VAC applied  Extremities:  well perfused and warm. Right PT doppler signal Neurologic: alert and oriented  CBC    Component Value Date/Time   WBC 10.5 03/11/2021 0632   RBC 2.99 (L) 03/11/2021 0632   HGB 7.7 (L) 03/11/2021 0632   HCT 24.2 (L) 03/11/2021 0632   PLT 411 (H) 03/11/2021 0632   MCV 80.9 03/11/2021 0632   MCH 25.8 (L) 03/11/2021 0632   MCHC 31.8 03/11/2021 0632   RDW 16.2 (H) 03/11/2021 0632   LYMPHSABS 4.6 (H) 03/06/2021 2320   MONOABS 1.0 03/06/2021 2320   EOSABS 0.2 03/06/2021 2320   BASOSABS 0.0 03/06/2021 2320    BMET    Component Value Date/Time   NA 141 03/11/2021 0632   K 3.7 03/11/2021 0632   CL 109 03/11/2021 0632   CO2 25 03/11/2021 0632   GLUCOSE 111 (H) 03/11/2021 0632   BUN <5 (L) 03/11/2021 0632   CREATININE 0.98 03/11/2021 0632   CALCIUM 8.9 03/11/2021 0632   GFRNONAA >60 03/11/2021 0632   GFRAA >60 02/13/2020 0058    INR    Component Value Date/Time   INR 1.0 02/26/2021 1200     Intake/Output Summary (Last 24 hours) at April 03, 2021 4098 Last data filed at 04-03-2021 0500 Gross per 24 hour  Intake 240 ml  Output 1650 ml  Net -1410 ml     Assessment/Plan:  50 y.o. male is s/p I&D right calf wound 5 Days Post-Op   R foot warm and well perfused Continue wound vac; vac changed today. Wound looks healthy with viable tissue TOC arranged home RN and wound vac. First change schedule for Friday 7/22 E.coli on wound cx. Abx changed to Keflex ( total of 5 days) Stable from vascular standpoint for discharge. He has follow up  appointment on 7/29 with Dr. Biagio Quint, PA-C [facility] [phone] 04/03/2021 8:22 AM  I have independently interviewed and examined patient and agree with PA assessment and plan above.   Giovana Faciane C. Randie Heinz, MD [facility] [place] Office: [phone] Pager: [phone]

## 2021-03-24 NOTE — Consult Note (Signed)
WOC consulted initially for NPWT dressings; however VVS has been changing them. Planning for DC today  Will sign off.  [name] [facility], CNS, [employer] [phone]

## 2021-03-24 NOTE — ED Notes (Signed)
Time of death called at 57

## 2021-03-24 NOTE — ED Provider Notes (Signed)
[facility] Provider Note   CSN: [id number] Arrival date & time: 2020/09/24  1448     History Chief complaint: Cardiac arrest  Sean [name] is a 50 y.o. male.  HPI  Patient presented via EMS in cardiac arrest.  Patient was recently in the hospital for complications associated with his peripheral vascular disease.  She does have history of multiple medical problems including severe peripheral vascular disease requiring lower extremity amputation.  Patient was admitted to the hospital on July 14 for postoperative infection.  He was discharged earlier this morning.  EMS was called because the patient had acute onset of shortness of breath.  According to the medical records, there was no mention of respiratory difficulty during his stay.  EMS noted the patient was hypoxic in the 70s on arrival.  He was in severe distress.  I started him on oxygen supplementation.  Patient ended up going into cardiac arrest.  He initially had a PEA type rhythm.  EMS proceeded with multiple treatments including epinephrine and King airway placement in addition to CPR.  He was given a total of 4 epi in route.  Patient presented in cardiac arrest in an asystolic rhythm  Past Medical History:  Diagnosis Date   Acquired deformity of right hand 11/27/2015   Formatting of this note might be different from the original. Secondary to injury/surgeries   AKA stump complication (HCC) 02/13/2020   Arthritis 05/06/2014   Carpal tunnel syndrome 05/06/2014   Chronic pain syndrome 02/13/2020   Class 2 severe obesity due to excess calories with serious comorbidity and body mass index (BMI) of 35.0 to 35.9 in adult [facility](HCC) 12/10/2020   Depression    Dislocation of distal interphalangeal (DIP) joint of finger 05/06/2014   Encounter for immunization 12/05/2020   Erectile dysfunction due to diseases classified elsewhere 04/06/2020   Essential hypertension 11/27/2015   History of left below knee  amputation (HCC) 05/16/2019   Hypertension    Impaired mobility and ADLs 05/16/2019   Ischemia of extremity 09/21/2018   Ischemia of left lower extremity 04/28/2019   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 Added automatically from request for surgery [id number]   Other long term (current) drug therapy 12/05/2020   PAD (peripheral artery disease) (HCC) 04/28/2019   Phantom limb syndrome with pain (HCC) 09/12/2019   Formatting of this note might be different from the original. 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   Tobacco use disorder 12/10/2020   Weak urinary stream 12/10/2020    Patient Active Problem List   Diagnosis Date Noted   Wound infection 03/07/2021   Ascending aortic aneurysm (HCC) 03/07/2021   Right lower lobe consolidation (HCC) 03/07/2021   Normocytic anemia 03/07/2021   Postoperative infection    Wound dehiscence    Peripheral arterial disease (HCC) 02/26/2021   Vitamin B12 deficiency (non anemic) 01/21/2021   Dyslipidemia 01/15/2021   Hypertension    Class 2 severe obesity due to excess calories with serious comorbidity and body mass index (BMI) of 35.0 to 35.9 in adult [facility](HCC) 12/10/2020   Tobacco use disorder 12/10/2020   Urinary urgency 12/10/2020   Weak urinary stream 12/10/2020   Preop cardiovascular exam 12/05/2020   Other long term (current) drug therapy 12/05/2020   Tinea cruris 12/05/2020   Erectile dysfunction due  to diseases classified elsewhere 04/06/2020   AKA stump complication (HCC) 02/13/2020   Chronic pain syndrome 02/13/2020   Neuropathic pain 11/10/2019   Phantom limb syndrome with pain (HCC) 09/12/2019   History of left below knee amputation (HCC) 05/16/2019   Impaired mobility and ADLs 05/16/2019   Ischemia of left lower extremity 04/28/2019   PAD  (peripheral artery disease) (HCC) 04/28/2019   Ischemia of extremity 09/21/2018   PVD (peripheral vascular disease) (HCC) 09/20/2018   Acquired deformity of right hand 11/27/2015   Essential hypertension 11/27/2015   Psoriasis 11/27/2015   Vitamin D deficiency 11/27/2015   Arthritis 05/06/2014   Carpal tunnel syndrome 05/06/2014   Dislocation of distal interphalangeal (DIP) joint of finger 05/06/2014   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    Past Surgical History:  Procedure Laterality Date   ABDOMINAL AORTOGRAM W/LOWER EXTREMITY N/A 05/08/2019   Procedure: ABDOMINAL AORTOGRAM W/LOWER EXTREMITY;  Surgeon: Maeola Harman, MD;  Location: [facility] INVASIVE CV LAB;  Service: Cardiovascular;  Laterality: N/A;   ABDOMINAL AORTOGRAM W/LOWER EXTREMITY N/A 12/23/2020   Procedure: ABDOMINAL AORTOGRAM W/LOWER EXTREMITY;  Surgeon: Maeola Harman, MD;  Location[facility] INVASIVE CV LAB;  Service: Cardiovascular;  Laterality: N/A;   ABDOMINAL AORTOGRAM W/LOWER EXTREMITY N/A 02/28/2021   Procedure: ABDOMINAL AORTOGRAM W/LOWER EXTREMITY;  Surgeon: Leonie Douglas, MD;  Location: [facility] INVASIVE CV LAB;  Service: Cardiovascular;  Laterality: N/A;   AMPUTATION Left 01/30/2020   Procedure: LEFT ABOVE KNEE AMPUTATION;  Surgeon: Chuck Hint, MD;  Location: [facility] OR;  Service: Vascular;  Laterality: Left;   ANGIOPLASTY Left 04/27/2019   Procedure: Balloon Angioplasty of left posterior tibial artery;  Surgeon: Maeola Harman, MD;  Location: [facility] OR;  Service: Vascular;  Laterality: Left;   APPLICATION OF WOUND VAC Right 03/07/2021   Procedure: APPLICATION OF WOUND VAC;  Surgeon: Sherren Kerns, MD;  Location: [facility] OR;  Service: Vascular;  Laterality: Right;   EMBOLECTOMY Left 09/21/2018   Procedure: THROMBECTOMY LEFT POPLITEAL ARTERY BYPASS LEFT POPLITEAL TO TIBIAL ARTERY WITH SAPHENOUS VEIN HARVEST - FASCIOTOMY;  Surgeon: Chuck Hint, MD;  Location: [facility] OR;   Service: Vascular;  Laterality: Left;   FEMORAL-POPLITEAL BYPASS GRAFT Right 01/23/2021   Procedure: RIGHT LOWER EXTREMITY FEMORAL-POPLITEAL ARTERY BYPASS;  Surgeon: Maeola Harman, MD;  Location: [facility] OR;  Service: Vascular;  Laterality: Right;   finger surgery Right    small finger  (2 different surgeries)   I & D EXTREMITY Right 03/07/2021   Procedure: IRRIGATION AND DEBRIDEMENT CALF;  Surgeon: Sherren Kerns, MD;  Location: [facility] OR;  Service: Vascular;  Laterality: Right;   LOWER EXTREMITY ANGIOGRAM Left 04/27/2019   Procedure: LEFT LOWER EXTREMITY ANGIOGRAM;  Surgeon: Maeola Harman, MD;  Location: [facility] OR;  Service: Vascular;  Laterality: Left;   PERIPHERAL VASCULAR BALLOON ANGIOPLASTY Left 05/08/2019   Procedure: PERIPHERAL VASCULAR BALLOON ANGIOPLASTY;  Surgeon: Maeola Harman, MD;  Location: [facility] INVASIVE CV LAB;  Service: Cardiovascular;  Laterality: Left;  Posterior tibial   PERIPHERAL VASCULAR BALLOON ANGIOPLASTY  02/28/2021   Procedure: PERIPHERAL VASCULAR BALLOON ANGIOPLASTY;  Surgeon: Leonie Douglas, MD;  Location: [facility] INVASIVE CV LAB;  Service: Cardiovascular;;   PERIPHERAL VASCULAR INTERVENTION Left 04/28/2019   Procedure: PERIPHERAL VASCULAR INTERVENTION;  Surgeon: Cephus Shelling, MD;  Location: [facility] INVASIVE CV LAB;  Service: Cardiovascular;  Laterality: Left;   PERIPHERAL VASCULAR THROMBECTOMY Bilateral 04/28/2019   Procedure: PERIPHERAL VASCULAR THROMBECTOMY;  Surgeon: Cephus Shelling, MD;  Location: [facility]  INVASIVE CV LAB;  Service: Cardiovascular;  Laterality: Bilateral;   PERIPHERAL VASCULAR THROMBECTOMY Left 05/08/2019   Procedure: PERIPHERAL VASCULAR THROMBECTOMY;  Surgeon: Maeola Harman, MD;  Location: [facility] INVASIVE CV LAB;  Service: Cardiovascular;  Laterality: Left;  posterior tibial   THROMBECTOMY OF BYPASS GRAFT FEMORAL- TIBIAL ARTERY Right 02/26/2021   Procedure: RIGHT LOWER EXTREMITY  THROMBECTOMY;  Surgeon: Nada Libman, MD;   Location: [facility] OR;  Service: Vascular;  Laterality: Right;   TRANSMETATARSAL AMPUTATION Left 05/09/2019   Procedure: Left Below Knee Amputation;  Surgeon: Maeola Harman, MD;  Location: [facility] OR;  Service: Vascular;  Laterality: Left;       Family History  Problem Relation Age of Onset   Diabetes Father     Social History   Tobacco Use   Smoking status: Every Day    Packs/day: 1.00    Types: Cigarettes   Smokeless tobacco: Never  Vaping Use   Vaping Use: Never used  Substance Use Topics   Alcohol use: Never   Drug use: Never    Home Medications Prior to Admission medications   Medication Sig Start Date End Date Taking? Authorizing Provider  Adalimumab (HUMIRA PEN) 40 MG/0.4ML PNKT Inject 40 mg into the skin every 14 (fourteen) days.    [provider]  amLODipine (NORVASC) 5 MG tablet Take 1 tablet (5 mg total) by mouth in the morning. 2021-03-13 04/11/21  Azucena Fallen, MD  aspirin EC 81 MG tablet Take 81 mg by mouth in the morning.    [provider]  bisacodyl (DULCOLAX) 5 MG EC tablet Take 5 mg by mouth daily as needed for moderate constipation.    [provider]  Buprenorphine HCl (BELBUCA) 300 MCG FILM Place 300 mcg inside cheek in the morning and at bedtime. 02/13/20   [provider]  clobetasol ointment (TEMOVATE) 0.05 % Apply 1 application topically 2 (two) times daily as needed (skin irritation). 06/02/20   [provider]  clopidogrel (PLAVIX) 75 MG tablet Take 1 tablet (75 mg total) by mouth daily with breakfast. 04/30/19   Emilie Rutter, PA-C  cyanocobalamin (,VITAMIN B-12,) 1000 MCG/ML injection Inject 1,000 mcg into the muscle once a week.    [provider]  DULoxetine (CYMBALTA) 30 MG capsule Take 30 mg by mouth in the morning. 01/09/20   [provider]  olmesartan-hydrochlorothiazide (BENICAR HCT) 40-25 MG tablet Take 1 tablet by mouth daily. 12/18/20   [provider]   oxyCODONE-acetaminophen (PERCOCET/ROXICET) 5-325 MG tablet Take 1 tablet by mouth every 4 (four) hours as needed for up to 5 days for moderate pain. 03-13-2021 03/17/21  Azucena Fallen, MD  potassium chloride SA (KLOR-CON) 20 MEQ tablet Take 20 mEq by mouth daily.    [provider]  pregabalin (LYRICA) 300 MG capsule Take 300 mg by mouth daily.    [provider]  rosuvastatin (CRESTOR) 10 MG tablet Take 10 mg by mouth daily.    [provider]  Testosterone 20.25 MG/ACT (1.62%) GEL Apply 40.5 mg topically daily. Rub on shoulder 12/27/20   [provider]  Vitamin D, Ergocalciferol, (DRISDOL) 1.25 MG (50000 UNIT) CAPS capsule Take 50,000 Units by mouth every Thursday. 12/11/20   [provider]  zinc gluconate 50 MG tablet Take 50 mg by mouth daily.    [provider]    Allergies    Patient has no known allergies.  Review of Systems   Review of Systems  All other systems reviewed  and are negative.  Physical Exam Updated Vital Signs Pulse (!) 0   Resp (!) 40   Ht 1.956 m (6\' 5" )   Wt (!) 136.5 kg   SpO2 100%   BMI 35.68 kg/m   Physical Exam Constitutional:      Appearance: He is ill-appearing.     Comments: Unresponsive  HENT:     Head: Normocephalic and atraumatic.     Nose: No rhinorrhea.     Mouth/Throat:     Comments: Clear mucus noted in the oropharynx Eyes:     General:        Right eye: No discharge.        Left eye: No discharge.  Cardiovascular:     Comments: Absent heart sounds Pulmonary:     Comments: Diminished breath sounds Abdominal:     General: There is distension.  Musculoskeletal:     Comments: Status post left above-the-knee amputation, left lower extremity with wound VAC, no drainage noted  Neurological:     GCS: GCS eye subscore is 1. GCS verbal subscore is 1. GCS motor subscore is 1.     Comments: Unresponsive    ED Results / Procedures / Treatments   Labs (all labs ordered are listed,  but only abnormal results are displayed) Labs Reviewed - No data to display  EKG None  Radiology No results found.  Procedures Procedure Name: Intubation Date/Time: 2021-03-20 3:57 PM Performed by: 03/14/2021, MD Pre-anesthesia Checklist: Patient identified, Patient being monitored, Emergency Drugs available, Timeout performed and Suction available Oxygen Delivery Method: Non-rebreather mask Preoxygenation: Pre-oxygenation with 100% oxygen Ventilation: Mask ventilation with difficulty Laryngoscope Size: Glidescope Tube size: 7.5 mm Placement Confirmation: ETT inserted through vocal cords under direct vision, CO2 detector and Breath sounds checked- equal and bilateral Difficulty Due To: Difficulty was anticipated and Difficult Airway- due to large tongue Comments: Patient intubated successfully    CPR  Date/Time: 2021/03/20 3:58 PM Performed by: 03/14/2021, MD Authorized by: Linwood Dibbles, MD  CPR Procedure Details:      Amount of time prior to administration of ACLS/BLS (minutes):  0   ACLS/BLS initiated by EMS: Yes     CPR/ACLS performed in the ED: Yes     Duration of CPR (minutes):  40   Outcome: Pt declared dead    CPR performed via ACLS guidelines under my direct supervision.  See RN documentation for details including defibrillator use, medications, doses and timing.   Medications Ordered in ED Medications  EPINEPHrine (ADRENALIN) 1 MG/10ML injection (0.1 mg Intravenous Given 2021-03-20 1502)  amiodarone (CORDARONE) 150 mg in dextrose 5 % 100 mL bolus (0 mg Intravenous Stopped 2021/03/20 1506)    ED Course  I have reviewed the triage vital signs and the nursing notes.  Pertinent labs & imaging results that were available during my care of the patient were reviewed by me and considered in my medical decision making (see chart for details).    MDM Rules/Calculators/A&P                           Patient presented in cardiac arrest.  In the ED he was in PEA.  There was 1  brief episode where there was some irregularity suggesting possible coarse V. fib.  Patient was shocked once.  No response.  Patient was given several rounds of epinephrine and continued CPR.  Unfortunately patient did not have return of spontaneous circulation and he was declared dead at  1505.  Question acute PE, flash pulm edema  considering his abrupt onset of dyspnea and hypoxia.  Case discussed with PCP PA Earl Gala. Final Clinical Impression(s) / ED Diagnoses Final diagnoses:  Cardiac arrest [facility])     Linwood Dibbles, MD 2021-03-16 1600

## 2021-03-24 NOTE — ED Provider Notes (Signed)
I was able to notify wife of the husband's passing.  Questions were answered and overall had a good discussion with the family.   Sean Norfolk, DO March 23, 2021 2122

## 2021-03-24 NOTE — Discharge Summary (Signed)
Physician Discharge Summary  Sean [name] WUJ:[id number] DOB: May 20, 1971 DOA: 03/06/2021  PCP: Virgilio Belling, PA-C  Admit date: 03/06/2021 Discharge date: 11-Apr-2021  Admitted From: Home  Disposition: Home  Recommendations for Outpatient Follow-up:  Follow up with PCP in 1-2 weeks Please obtain BMP/CBC in one week Please follow up with vascular surgery as scheduled  Home Health: Home health PT Equipment/Devices: None  Discharge Condition: Stable CODE STATUS: Full Diet recommendation: Renal dialysis diet  Brief/Interim Summary: Sean [name] is a 50 y.o. male with medical history significant for psoriasis, hypertension, chronic pain, current smoker, and peripheral arterial disease with history of left AKA and recent right superficial femoral artery to tibioperoneal trunk bypass, now presenting to the emergency department for evaluation of wound dehiscence and purulent drainage. Vascular surgery was consulted by the ED physician and medical admission was recommended.  Patient was treated with vancomycin and Zosyn in the ED.  Patient admitted as above with sepsis secondary to right lower extremity cellulitis and infection.  Patient was evaluated by vascular surgery and taken to the OR 03/07/2021 for washout and wound VAC placement.  Tolerated procedure quite well.  Patient was initially placed on broad-spectrum antibiotics with Zosyn and vancomycin de-escalated to cephalexin and ultimately completed course in hospital.  Patient will be discharged home with wound VAC per vascular surgery.  Outpatient follow-up with PCP this week to ensure follow-up close for for chronic comorbid conditions as above, vascular surgery scheduled appointment July 29 for close follow-up postoperatively.  Patient otherwise stable and agreeable for discharge home  Discharge Diagnoses:  Principal Problem:   Wound infection Active Problems:   PAD (peripheral artery disease) (HCC)   Chronic pain syndrome    Psoriasis   Hypertension   Peripheral arterial disease (HCC)   Ascending aortic aneurysm (HCC)   Right lower lobe consolidation (HCC)   Normocytic anemia    Discharge Instructions  Discharge Instructions     Call MD for:  redness, tenderness, or signs of infection (pain, swelling, redness, odor or green/yellow discharge around incision site)   Complete by: As directed    Call MD for:  severe uncontrolled pain   Complete by: As directed    Call MD for:  temperature >100.4   Complete by: As directed    Diet - low sodium heart healthy   Complete by: As directed    Discharge wound care:   Complete by: As directed    Continue wound vac care as directed   Increase activity slowly   Complete by: As directed       Allergies as of 04/11/2021   No Known Allergies      Medication List     STOP taking these medications    hydrALAZINE 10 MG tablet Commonly known as: APRESOLINE       TAKE these medications    amLODipine 5 MG tablet Commonly known as: NORVASC Take 1 tablet (5 mg total) by mouth in the morning. What changed:  medication strength how much to take   aspirin EC 81 MG tablet Take 81 mg by mouth in the morning.   Belbuca 300 MCG Film Generic drug: Buprenorphine HCl Place 300 mcg inside cheek in the morning and at bedtime.   bisacodyl 5 MG EC tablet Commonly known as: DULCOLAX Take 5 mg by mouth daily as needed for moderate constipation.   clobetasol ointment 0.05 % Commonly known as: TEMOVATE Apply 1 application topically 2 (two) times daily as needed (skin irritation).   clopidogrel 75  MG tablet Commonly known as: PLAVIX Take 1 tablet (75 mg total) by mouth daily with breakfast.   cyanocobalamin 1000 MCG/ML injection Commonly known as: (VITAMIN B-12) Inject 1,000 mcg into the muscle once a week.   DULoxetine 30 MG capsule Commonly known as: CYMBALTA Take 30 mg by mouth in the morning.   Humira Pen 40 MG/0.4ML Pnkt Generic drug:  Adalimumab Inject 40 mg into the skin every 14 (fourteen) days.   olmesartan-hydrochlorothiazide 40-25 MG tablet Commonly known as: BENICAR HCT Take 1 tablet by mouth daily.   oxyCODONE-acetaminophen 5-325 MG tablet Commonly known as: PERCOCET/ROXICET Take 1-2 tablets by mouth every 4 (four) hours as needed for moderate pain.   potassium chloride SA 20 MEQ tablet Commonly known as: KLOR-CON Take 20 mEq by mouth daily.   pregabalin 300 MG capsule Commonly known as: LYRICA Take 300 mg by mouth daily.   rosuvastatin 10 MG tablet Commonly known as: CRESTOR Take 10 mg by mouth daily.   Testosterone 20.25 MG/ACT (1.62%) Gel Apply 40.5 mg topically daily. Rub on shoulder   Vitamin D (Ergocalciferol) 1.25 MG (50000 UNIT) Caps capsule Commonly known as: DRISDOL Take 50,000 Units by mouth every Thursday.   zinc gluconate 50 MG tablet Take 50 mg by mouth daily.               Discharge Care Instructions  (From admission, onward)           Start     Ordered   27-Mar-2021 0000  Discharge wound care:       Comments: Continue wound vac care as directed   27-Mar-2021 0743            Follow-up Information     [facility] Follow up.   Specialty: Home Health Services Why: HHRN/PT resumption of care- pt will need home wound VAC drsg changes 3x week (they will do visit on Friday July 22 for wound VAC drsg change)- [facility] home wound VAC arranged- to be delivered to the bedside Contact information: [address] [place] [address] [phone]                No Known Allergies  Consultations: Vascular surgery   Procedures/Studies: PERIPHERAL VASCULAR CATHETERIZATION  Result Date: 03/02/2021 Formatting of this result is different from the original. DATE OF SERVICE: 02/28/2021   PATIENT:  Sean [name]  50 y.o. male   PRE-OPERATIVE DIAGNOSIS:  Atherosclerosis of native arteries of right lower extremity. Status post thrombectomy of right  superficial femoral to tibioperoneal trunk bypass.   POST-OPERATIVE DIAGNOSIS:  Same   PROCEDURE:  1) US guided left common femoral artery access 2) Aortogram 3) right lower extremity angiogram with third order cannulation ( total contrast) 4) Conscious sedation (74 minutes) 5) Angioplasty of right posterior tibial artery and peroneal artery (3.5 x 42mm Coyote)   SURGEON:  Surgeon(s) and Role:    * Hawken, Rande Brunt, MD - Primary   ASSISTANT: none   ANESTHESIA:   local and IV sedation   EBL: min   BLOOD ADMINISTERED:none   DRAINS: none   LOCAL MEDICATIONS USED:  LIDOCAINE   SPECIMEN:  none   COUNTS: confirmed correct.   TOURNIQUET:  none   PATIENT DISPOSITION:  PACU - hemodynamically stable.  Delay start of Pharmacological VTE agent (>24hrs) due to surgical blood loss or risk of bleeding: no   INDICATION FOR PROCEDURE: Sean [name] is a 50 y.o. male with recently thrombosed bypass. He underwent successful thrombectomy.  No clear cause could be found for the thrombosis. After careful discussion of risks, benefits, and alternatives the patient was offered angiography. We specifically discussed access site complications. The patient understood and wished to proceed.   OPERATIVE FINDINGS: No issues with inflow or conduit of R SFA - TP trunk bypass No outflow through peroneal artery Good outflow through PT artery Moderate stenosis (~65%) of proximal PT near anastomosis No response to angioplasty of peroneal Resolution of PT stenosis after angioplasty   DESCRIPTION OF PROCEDURE: After identification of the patient in the pre-operative holding area, the patient was transferred to the operating room. The patient was positioned supine on the operating room table. Anesthesia was induced. The groins was prepped and draped in standard fashion. A surgical pause was performed confirming correct patient, procedure, and operative location.   The left groin was anesthetized with subcutaneous injection of 1% lidocaine. Using  ultrasound guidance, the left common femoral artery was accessed with micropuncture technique. Fluoroscopy was used to confirm cannulation over the femoral head. Sheathogram was not performed. The 72F sheath was upsized to 41F.   An 035 glidewire advantage was advanced into the distal aorta. Over the wire an omni flush catheter was advanced to the level of L2. Aortogram was performed - see above for details.   The right common iliac artery was selected with the 035 glidewire advantage. The wire was advanced into the common femoral artery. Over the wire the omni flush catheter was advanced into the external iliac artery. Selective angiography was performed - see above for details.   The decision was made to intervene. The patient was heparinized with 16109 units of heparin. The sheath was exchanged for a 41F x 45cm sheath. Selective angiography of the left lower extremity was performed prior to intervention. The lesions were treated with 3.5 x 60mm Coyote   Completion angiography revealed: Resolution of PT juxta-anastomotic stenosis   A mynx device was used to close the arteriotomy. The mynx failed to close the arteriotomy. Manual pressure was held. Hemostasis was excellent after pressure   Conscious sedation was administered with the use of IV fentanyl and midazolam under continuous physician and nurse monitoring.  Heart rate, blood pressure, and oxygen saturation were continuously monitored.  Total sedation time was 74 minutes   Upon completion of the case instrument and sharps counts were confirmed correct. The patient was transferred to the PACU in good condition. I was present for all portions of the procedure.   PLAN: ASA, Plavix, Statin therapy. Routine postop care.   Rande Brunt. Lenell Antu, MD [facility] Phone Number: [phone] 02/28/2021 3:10 PM  VAS [place] ABI WITH/WO TBI  Result Date: 02/26/2021  LOWER EXTREMITY DOPPLER STUDY Patient Name:  Sean [name]  Date of Exam:    02/26/2021 Medical Rec #: [id number]       Accession #:    [id number] Date of Birth: 08/31/1970        Patient Gender: M Patient Age:   49Y Exam Location:  [facility] Procedure:      VAS [place] ABI WITH/WO TBI Referring Phys: [id number] Rande Brunt HAWKEN --------------------------------------------------------------------------------   Vascular Interventions: Status post right SFA to tibioperoneal trunk bypass with                         greater saphenous vein on 01/23/21  Left BKA 05/09/2019, left popliteal to posterior tibial                         artery bypass. Performing Technologist: Marilynne Halsted RDMS, RVT  Examination Guidelines: A complete evaluation includes at minimum, Doppler waveform signals and systolic blood pressure reading at the level of bilateral brachial, anterior tibial, and posterior tibial arteries, when vessel segments are accessible. Bilateral testing is considered an integral part of a complete examination. Photoelectric Plethysmograph (PPG) waveforms and toe systolic pressure readings are included as required and additional duplex testing as needed. Limited examinations for reoccurring indications may be performed as noted.  ABI Findings: +--------+------------------+-----+----------+--------+ Right   Rt Pressure (mmHg)IndexWaveform  Comment  +--------+------------------+-----+----------+--------+ [id number]                    triphasic          +--------+------------------+-----+----------+--------+ PTA     91                0.62 monophasic         +--------+------------------+-----+----------+--------+ DP      67                0.46 monophasic         +--------+------------------+-----+----------+--------+ +--------+------------------+-----+---------+-------+ Left    Lt Pressure (mmHg)IndexWaveform Comment +--------+------------------+-----+---------+-------+ [id number]                    triphasic         +--------+------------------+-----+---------+-------+ +-------+-----------+--------------+------------+------------+ ABI/TBIToday's ABIToday's TBI   Previous ABIPrevious TBI +-------+-----------+--------------+------------+------------+ Right  0.62       not obtainable0.61        0.26         +-------+-----------+--------------+------------+------------+ Left   BKA                                               +-------+-----------+--------------+------------+------------+  Summary: Right: Resting right ankle-brachial index indicates moderate right lower extremity arterial disease.  *See table(s) above for measurements and observations.  Electronically signed by Heath Lark on 02/26/2021 at 5:22:49 PM.    Final    VAS [place] LOWER EXTREMITY ARTERIAL DUPLEX  Result Date: 02/26/2021 LOWER EXTREMITY ARTERIAL DUPLEX STUDY Patient Name:  Sean [name]  Date of Exam:   02/26/2021 Medical Rec #: [id number]       Accession #:    [id number] Date of Birth: 1970/09/11        Patient Gender: M Patient Age:   66Y Exam Location:  [place] Procedure:      VAS [place] LOWER EXTREMITY ARTERIAL DUPLEX Referring Phys: [id number] Rande Brunt HAWKEN --------------------------------------------------------------------------------   Vascular Interventions: Status post right SFA to Tibioperoneal trunk bypass with                         greater saphenous vein on 01/23/21. Left BKA 05/09/2019. Current ABI:            Right 0.62 Performing Technologist: Duenweg Sink Sturdivant RDMS, RVT  Examination Guidelines: A complete evaluation includes B-mode imaging, spectral Doppler, color Doppler, and power Doppler as needed of all accessible portions of each vessel. Bilateral testing is considered an integral part of a complete examination. Limited examinations for reoccurring indications may be performed as noted.  +----------+--------+-----+--------+-------------------+--------+ RIGHT     PSV cm/sRatioStenosisWaveform  Comments  +----------+--------+-----+--------+-------------------+--------+ CFA Distal69                   biphasic                    +----------+--------+-----+--------+-------------------+--------+ SFA Prox  57                   biphasic                    +----------+--------+-----+--------+-------------------+--------+ SFA Mid   78                   biphasic                    +----------+--------+-----+--------+-------------------+--------+ SFA Distal87                   biphasic                    +----------+--------+-----+--------+-------------------+--------+ POP Prox  38                                               +----------+--------+-----+--------+-------------------+--------+ POP Mid                occluded                            +----------+--------+-----+--------+-------------------+--------+ POP Distal             occluded                            +----------+--------+-----+--------+-------------------+--------+ TP Trunk               occluded                            +----------+--------+-----+--------+-------------------+--------+ PTA Prox  28                   monophasic                  +----------+--------+-----+--------+-------------------+--------+ PTA Distal38                                               +----------+--------+-----+--------+-------------------+--------+ PERO Prox                      dampened monophasic         +----------+--------+-----+--------+-------------------+--------+ PERO Mid  24                   monophasic                  +----------+--------+-----+--------+-------------------+--------+ DP        21                   dampened monophasic         +----------+--------+-----+--------+-------------------+--------+  Right Graft #1: Superficial femoral to tibioperoneal trunk +------------------+--------+--------+--------+--------+                   PSV  cm/sStenosisWaveformComments +------------------+--------+--------+--------+--------+ Inflow  124                              +------------------+--------+--------+--------+--------+ Prox Anastomosis          occluded                 +------------------+--------+--------+--------+--------+ Proximal Graft            occluded                 +------------------+--------+--------+--------+--------+ Mid Graft                 occluded                 +------------------+--------+--------+--------+--------+ Distal Graft              occluded                 +------------------+--------+--------+--------+--------+ Distal Anastomosis        occluded                 +------------------+--------+--------+--------+--------+ Outflow                                            +------------------+--------+--------+--------+--------+   Summary: Right: Right popliteal artery and tibioperoneal trunk appeared occluded. Bypass graft was technically difficult to visualized which may be occluded.  See table(s) above for measurements and observations. Electronically signed by Heath Lark on 02/26/2021 at 5:22:41 PM.    Final      Subjective: No acute issues or events overnight, tolerating p.o. well denies nausea vomiting diarrhea constipation headache fevers or chills.  Patient is having difficulty putting on his left prosthesis due to swelling in the left stump   Discharge Exam: Vitals:   03/11/21 2309 03/15/21 0320  BP: 124/84 102/63  Pulse: 88 75  Resp: 20 17  Temp: 97.9 F (36.6 C) 97.7 F (36.5 C)  SpO2: 100% 100%   Vitals:   03/11/21 1551 03/11/21 2011 03/11/21 2309 15-Mar-2021 0320  BP: 121/79 135/78 124/84 102/63  Pulse: 91 86 88 75  Resp: Temp: 97.9 F (36.6 C) 97.9 F (36.6 C) 97.9 F (36.6 C) 97.7 F (36.5 C)  TempSrc: Oral Oral Oral Oral  SpO2: 100% 100% 100% 100%  Weight:      Height:        General: Pt is alert, awake, not in  acute distress Cardiovascular: RRR, S1/S2 +, no rubs, no gallops Respiratory: CTA bilaterally, no wheezing, no rhonchi Abdominal: Soft, NT, ND, bowel sounds + Extremities: Left AKA right wound VAC clean dry intact    The results of significant diagnostics from this hospitalization (including imaging, microbiology, ancillary and laboratory) are listed below for reference.     Microbiology: Recent Results (from the past 240 hour(s))  Resp Panel by RT-PCR (Flu A&B, Covid) Nasopharyngeal Swab     Status: None   Collection Time: 03/07/21  2:55 AM   Specimen: Nasopharyngeal Swab; Nasopharyngeal(NP) swabs in vial transport medium  Result Value Ref Range Status   SARS Coronavirus 2 by RT PCR NEGATIVE NEGATIVE Final    Comment: (NOTE) SARS-CoV-2 target nucleic acids are NOT DETECTED.  The SARS-CoV-2 RNA is generally detectable in upper respiratory specimens during the acute phase of infection. The lowest concentration of SARS-CoV-2 viral copies this assay can detect is 138  copies/mL. A negative result does not preclude SARS-Cov-2 infection and should not be used as the sole basis for treatment or other patient management decisions. A negative result may occur with  improper specimen collection/handling, submission of specimen other than nasopharyngeal swab, presence of viral mutation(s) within the areas targeted by this assay, and inadequate number of viral copies(<138 copies/mL). A negative result must be combined with clinical observations, patient history, and epidemiological information. The expected result is Negative.  Fact Sheet for Patients:  BloggerCourse.com  Fact Sheet for Healthcare Providers:  SeriousBroker.it  This test is no t yet approved or cleared by the [place] FDA and  has been authorized for detection and/or diagnosis of SARS-CoV-2 by FDA under an Emergency Use Authorization (EUA). This EUA will remain  in  effect (meaning this test can be used) for the duration of the COVID-19 declaration under Section 564(b)(1) of the Act, 21 U.S.C.section 360bbb-3(b)(1), unless the authorization is terminated  or revoked sooner.       Influenza A by PCR NEGATIVE NEGATIVE Final   Influenza B by PCR NEGATIVE NEGATIVE Final    Comment: (NOTE) The Xpert Xpress SARS-CoV-2/FLU/RSV plus assay is intended as an aid in the diagnosis of influenza from Nasopharyngeal swab specimens and should not be used as a sole basis for treatment. Nasal washings and aspirates are unacceptable for Xpert Xpress SARS-CoV-2/FLU/RSV testing.  Fact Sheet for Patients: BloggerCourse.com  Fact Sheet for Healthcare Providers: SeriousBroker.it  This test is not yet approved or cleared by the [place] FDA and has been authorized for detection and/or diagnosis of SARS-CoV-2 by FDA under an Emergency Use Authorization (EUA). This EUA will remain in effect (meaning this test can be used) for the duration of the COVID-19 declaration under Section 564(b)(1) of the Act, 21 U.S.C. section 360bbb-3(b)(1), unless the authorization is terminated or revoked.  Performed at [facility], [address] [place], [place] [address]   Aerobic Culture w Gram Stain (superficial specimen)     Status: None   Collection Time: 03/07/21  1:24 PM   Specimen: PATH Other; Tissue  Result Value Ref Range Status   Specimen Description WOUND  Final   Special Requests RIGHT LEG WOUND SPEC A  Final   Gram Stain   Final    RARE WBC PRESENT,BOTH PMN AND MONONUCLEAR RARE GRAM NEGATIVE RODS Performed at [facility], [address], [place], [place] [address]    Culture FEW ESCHERICHIA COLI RARE STAPHYLOCOCCUS AUREUS   Final   Report Status 03/10/2021 FINAL  Final   Organism ID, Bacteria ESCHERICHIA COLI  Final   Organism ID, Bacteria STAPHYLOCOCCUS AUREUS  Final      Susceptibility    Escherichia coli - MIC*    AMPICILLIN >=32 RESISTANT Resistant     CEFAZOLIN <=4 SENSITIVE Sensitive     CEFEPIME <=0.12 SENSITIVE Sensitive     CEFTAZIDIME <=1 SENSITIVE Sensitive     CEFTRIAXONE <=0.25 SENSITIVE Sensitive     CIPROFLOXACIN <=0.25 SENSITIVE Sensitive     GENTAMICIN <=1 SENSITIVE Sensitive     IMIPENEM <=0.25 SENSITIVE Sensitive     TRIMETH/SULFA <=20 SENSITIVE Sensitive     AMPICILLIN/SULBACTAM >=32 RESISTANT Resistant     PIP/TAZO <=4 SENSITIVE Sensitive     * FEW ESCHERICHIA COLI   Staphylococcus aureus - MIC*    CIPROFLOXACIN <=0.5 SENSITIVE Sensitive     ERYTHROMYCIN <=0.25 SENSITIVE Sensitive     GENTAMICIN <=0.5 SENSITIVE Sensitive     OXACILLIN <=0.25 SENSITIVE  Sensitive     TETRACYCLINE <=1 SENSITIVE Sensitive     VANCOMYCIN <=0.5 SENSITIVE Sensitive     TRIMETH/SULFA <=10 SENSITIVE Sensitive     CLINDAMYCIN <=0.25 SENSITIVE Sensitive     RIFAMPIN <=0.5 SENSITIVE Sensitive     Inducible Clindamycin NEGATIVE Sensitive     * RARE STAPHYLOCOCCUS AUREUS     Labs: BNP (last 3 results) No results for input(s): BNP in the last 8760 hours. Basic Metabolic Panel: Recent Labs  Lab 03/06/21 2320 03/08/21 0539 03/09/21 0622 03/11/21 0632  NA 135 138 139 141  K 3.8 4.0 3.6 3.7  CL 100 105 108 109  CO2 26 23 23 25   GLUCOSE 161* 139* 124* 111*  BUN 11 11 8  <5*  CREATININE 1.04 0.96 0.83 0.98  CALCIUM 8.7* 8.7* 8.2* 8.9   Liver Function Tests: Recent Labs  Lab 03/06/21 2320  AST 14*  ALT 18  ALKPHOS 60  BILITOT 0.6  PROT 6.3*  ALBUMIN 3.2*   No results for input(s): LIPASE, AMYLASE in the last 168 hours. No results for input(s): AMMONIA in the last 168 hours. CBC: Recent Labs  Lab 03/06/21 2320 03/08/21 0539 03/09/21 0622 03/11/21 0632  WBC 14.5* 10.0 9.1 10.5  NEUTROABS 8.6*  --   --   --   HGB 8.7* 7.6* 7.1* 7.7*  HCT 27.4* 23.7* 22.9* 24.2*  MCV 82.0 80.9 80.6 80.9  PLT 413* 402* 375 411*   Cardiac Enzymes: No results for  input(s): CKTOTAL, CKMB, CKMBINDEX, TROPONINI in the last 168 hours. BNP: Invalid input(s): POCBNP CBG: No results for input(s): GLUCAP in the last 168 hours. D-Dimer No results for input(s): DDIMER in the last 72 hours. Hgb A1c No results for input(s): HGBA1C in the last 72 hours. Lipid Profile No results for input(s): CHOL, HDL, LDLCALC, TRIG, CHOLHDL, LDLDIRECT in the last 72 hours. Thyroid function studies No results for input(s): TSH, T4TOTAL, T3FREE, THYROIDAB in the last 72 hours.  Invalid input(s): FREET3 Anemia work up No results for input(s): VITAMINB12, FOLATE, FERRITIN, TIBC, IRON, RETICCTPCT in the last 72 hours. Urinalysis    Component Value Date/Time   COLORURINE STRAW (A) 01/16/2021 1119   APPEARANCEUR CLEAR 01/16/2021 1119   LABSPEC 1.008 01/16/2021 1119   PHURINE 7.0 01/16/2021 1119   GLUCOSEU NEGATIVE 01/16/2021 1119   HGBUR SMALL (A) 01/16/2021 1119   BILIRUBINUR NEGATIVE 01/16/2021 1119   KETONESUR NEGATIVE 01/16/2021 1119   PROTEINUR NEGATIVE 01/16/2021 1119   NITRITE NEGATIVE 01/16/2021 1119   LEUKOCYTESUR NEGATIVE 01/16/2021 1119   Sepsis Labs Invalid input(s): PROCALCITONIN,  WBC,  LACTICIDVEN Microbiology Recent Results (from the past 240 hour(s))  Resp Panel by RT-PCR (Flu A&B, Covid) Nasopharyngeal Swab     Status: None   Collection Time: 03/07/21  2:55 AM   Specimen: Nasopharyngeal Swab; Nasopharyngeal(NP) swabs in vial transport medium  Result Value Ref Range Status   SARS Coronavirus 2 by RT PCR NEGATIVE NEGATIVE Final    Comment: (NOTE) SARS-CoV-2 target nucleic acids are NOT DETECTED.  The SARS-CoV-2 RNA is generally detectable in upper respiratory specimens during the acute phase of infection. The lowest concentration of SARS-CoV-2 viral copies this assay can detect is 138 copies/mL. A negative result does not preclude SARS-Cov-2 infection and should not be used as the sole basis for treatment or other patient management decisions. A  negative result may occur with  improper specimen collection/handling, submission of specimen other than nasopharyngeal swab, presence of viral mutation(s) within the areas targeted by this assay, and  inadequate number of viral copies(<138 copies/mL). A negative result must be combined with clinical observations, patient history, and epidemiological information. The expected result is Negative.  Fact Sheet for Patients:  BloggerCourse.comhttps://www.fda.gov/media/152166/download  Fact Sheet for Healthcare Providers:  SeriousBroker.ithttps://www.fda.gov/media/152162/download  This test is no t yet approved or cleared by the [place] FDA and  has been authorized for detection and/or diagnosis of SARS-CoV-2 by FDA under an Emergency Use Authorization (EUA). This EUA will remain  in effect (meaning this test can be used) for the duration of the COVID-19 declaration under Section 564(b)(1) of the Act, 21 U.S.C.section 360bbb-3(b)(1), unless the authorization is terminated  or revoked sooner.       Influenza A by PCR NEGATIVE NEGATIVE Final   Influenza B by PCR NEGATIVE NEGATIVE Final    Comment: (NOTE) The Xpert Xpress SARS-CoV-2/FLU/RSV plus assay is intended as an aid in the diagnosis of influenza from Nasopharyngeal swab specimens and should not be used as a sole basis for treatment. Nasal washings and aspirates are unacceptable for Xpert Xpress SARS-CoV-2/FLU/RSV testing.  Fact Sheet for Patients: BloggerCourse.comhttps://www.fda.gov/media/152166/download  Fact Sheet for Healthcare Providers: SeriousBroker.ithttps://www.fda.gov/media/152162/download  This test is not yet approved or cleared by the [place] FDA and has been authorized for detection and/or diagnosis of SARS-CoV-2 by FDA under an Emergency Use Authorization (EUA). This EUA will remain in effect (meaning this test can be used) for the duration of the COVID-19 declaration under Section 564(b)(1) of the Act, 21 U.S.C. section 360bbb-3(b)(1), unless the authorization  is terminated or revoked.  Performed at [facility], [address] [place], [place] [address]   Aerobic Culture w Gram Stain (superficial specimen)     Status: None   Collection Time: 03/07/21  1:24 PM   Specimen: PATH Other; Tissue  Result Value Ref Range Status   Specimen Description WOUND  Final   Special Requests RIGHT LEG WOUND SPEC A  Final   Gram Stain   Final    RARE WBC PRESENT,BOTH PMN AND MONONUCLEAR RARE GRAM NEGATIVE RODS Performed at [facility], [address] [place], [place] [address]    Culture FEW ESCHERICHIA COLI RARE STAPHYLOCOCCUS AUREUS   Final   Report Status 03/10/2021 FINAL  Final   Organism ID, Bacteria ESCHERICHIA COLI  Final   Organism ID, Bacteria STAPHYLOCOCCUS AUREUS  Final      Susceptibility   Escherichia coli - MIC*    AMPICILLIN >=32 RESISTANT Resistant     CEFAZOLIN <=4 SENSITIVE Sensitive     CEFEPIME <=0.12 SENSITIVE Sensitive     CEFTAZIDIME <=1 SENSITIVE Sensitive     CEFTRIAXONE <=0.25 SENSITIVE Sensitive     CIPROFLOXACIN <=0.25 SENSITIVE Sensitive     GENTAMICIN <=1 SENSITIVE Sensitive     IMIPENEM <=0.25 SENSITIVE Sensitive     TRIMETH/SULFA <=20 SENSITIVE Sensitive     AMPICILLIN/SULBACTAM >=32 RESISTANT Resistant     PIP/TAZO <=4 SENSITIVE Sensitive     * FEW ESCHERICHIA COLI   Staphylococcus aureus - MIC*    CIPROFLOXACIN <=0.5 SENSITIVE Sensitive     ERYTHROMYCIN <=0.25 SENSITIVE Sensitive     GENTAMICIN <=0.5 SENSITIVE Sensitive     OXACILLIN <=0.25 SENSITIVE Sensitive     TETRACYCLINE <=1 SENSITIVE Sensitive     VANCOMYCIN <=0.5 SENSITIVE Sensitive     TRIMETH/SULFA <=10 SENSITIVE Sensitive     CLINDAMYCIN <=0.25 SENSITIVE Sensitive     RIFAMPIN <=0.5 SENSITIVE Sensitive     Inducible Clindamycin NEGATIVE Sensitive     * RARE  STAPHYLOCOCCUS AUREUS     Time coordinating discharge: Over 30 minutes  SIGNED:   Azucena Fallen, DO [facility] 04-04-21, 7:43 AM Pager   If 7PM-7AM,  please contact night-coverage [email or web address]

## 2021-03-24 DEATH — deceased

## 2021-03-25 ENCOUNTER — Ambulatory Visit: Payer: BLUE CROSS/BLUE SHIELD | Admitting: Thoracic Surgery (Cardiothoracic Vascular Surgery)

## 2021-03-25 ENCOUNTER — Other Ambulatory Visit: Payer: BLUE CROSS/BLUE SHIELD

## 2021-07-26 IMAGING — CT CT CHEST W/O CM
2 of 4 series · 15 of 36 positions shown, 18 images · non-contrast
Comparison: No prior chest CT.

CLINICAL DATA: Follow up enlarged aorta. Reported surgery for right
leg DVT tomorrow.

EXAM:
CT CHEST WITHOUT CONTRAST
TECHNIQUE: Multidetector CT imaging of the chest was performed following the
standard protocol without IV contrast.

[Series 2: thorax · axial · 0.81mm/px · z∈[-334,-36]mm · 12 of 177 slices shown, 15 images]
[im 14/177  mediastinal]
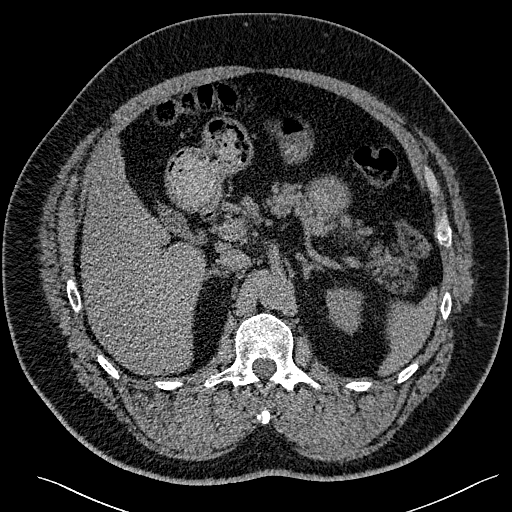
[im 14/177  lung]
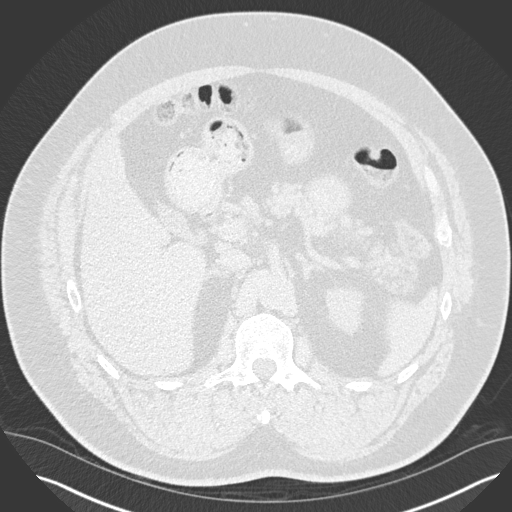
[im 28/177  lung]
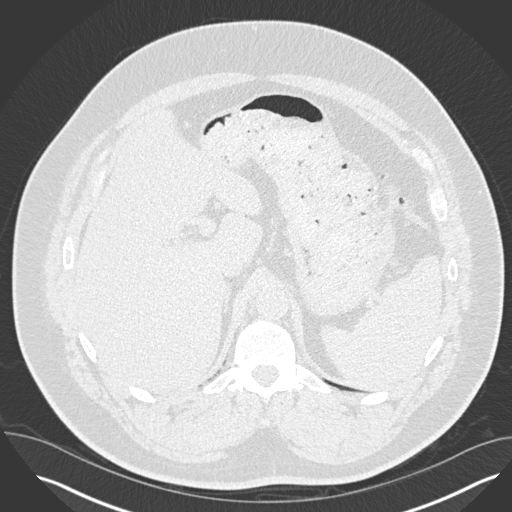
[im 41/177  lung]
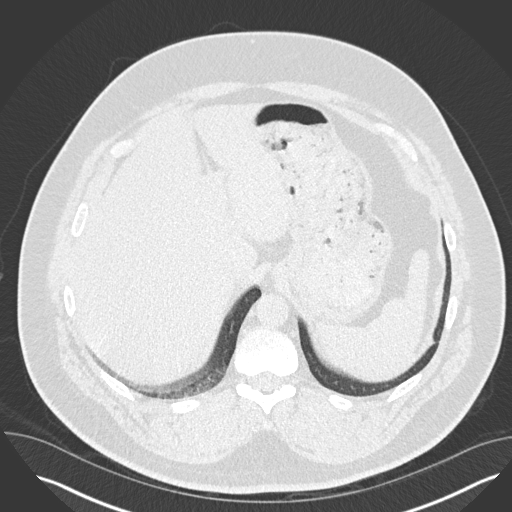
[im 55/177  lung]
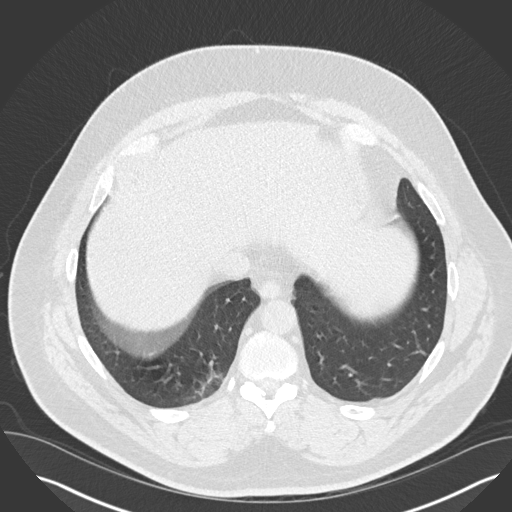
[im 68/177  mediastinal]
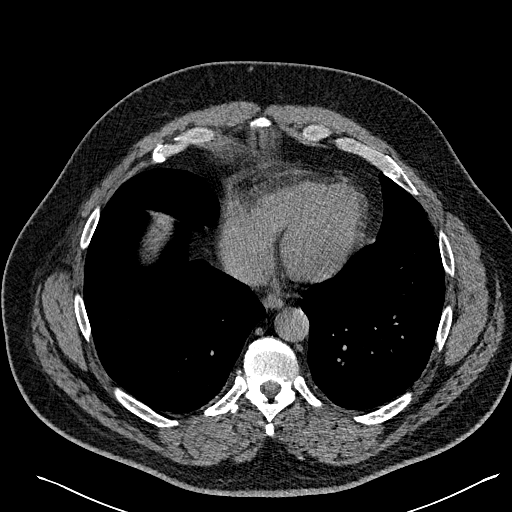
[im 68/177  lung]
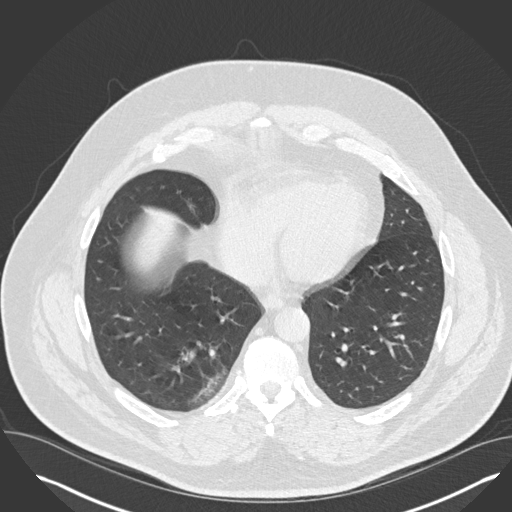
[im 82/177  lung]
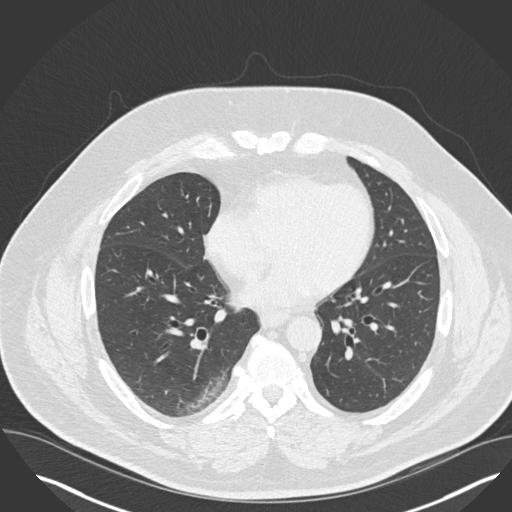
[im 95/177  lung]
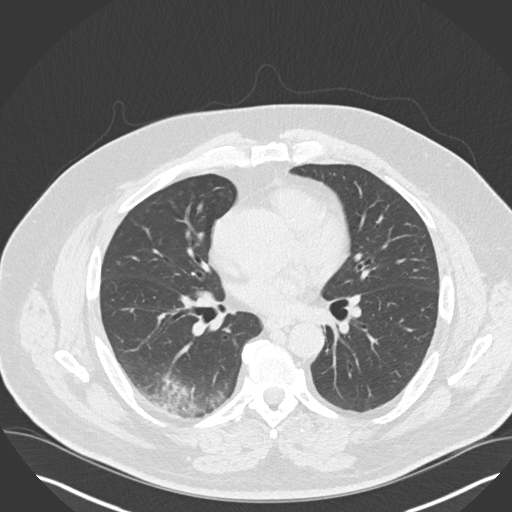
[im 109/177  lung]
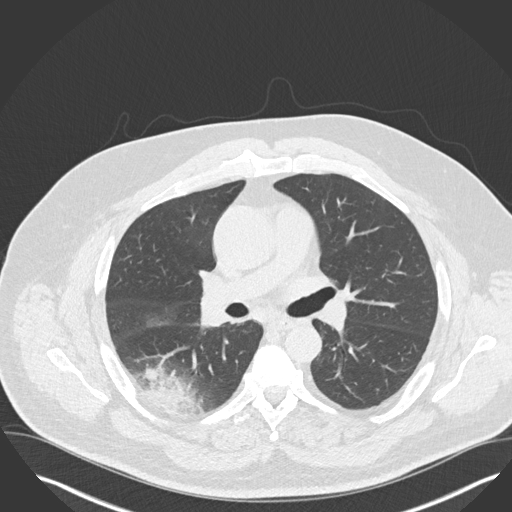
[im 122/177  mediastinal]
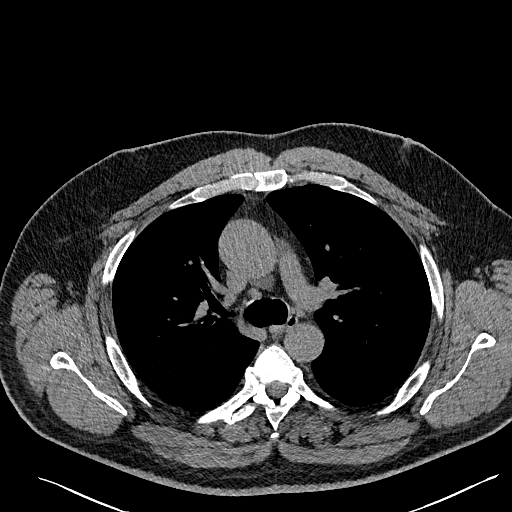
[im 122/177  lung]
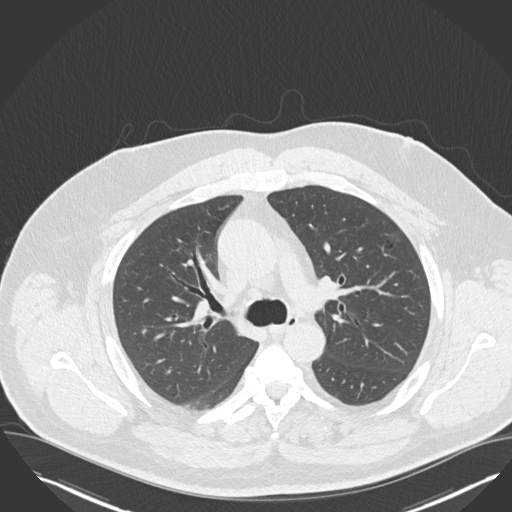
[im 136/177  lung]
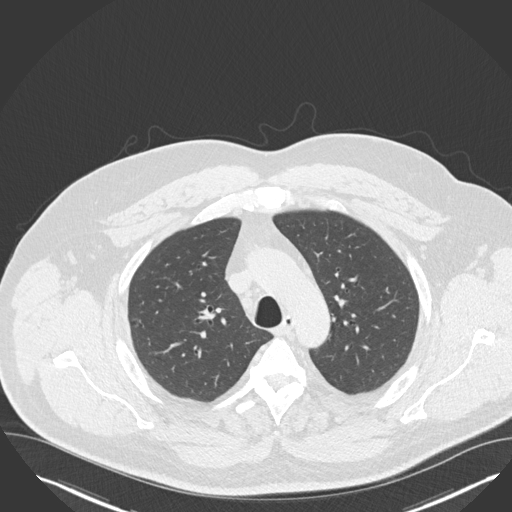
[im 149/177  lung]
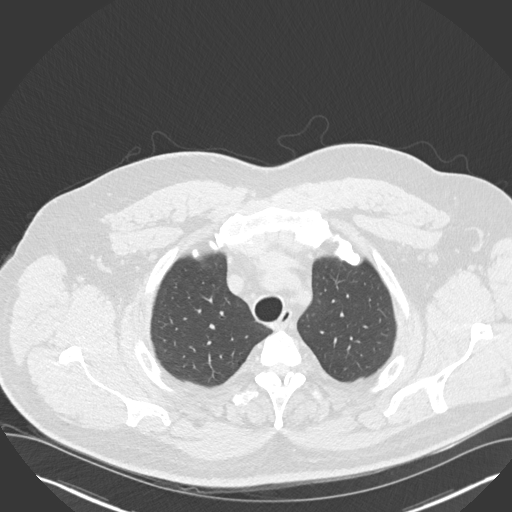
[im 163/177  lung]
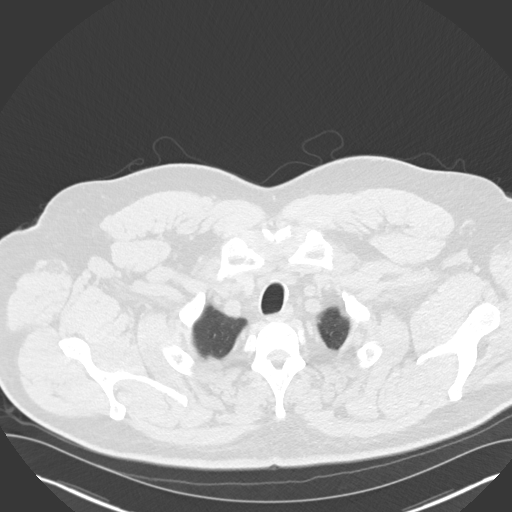

[Series 5: coronal · coronal · 0.69mm/px · 3 of 145 slices shown]
[im 29/145  lung]
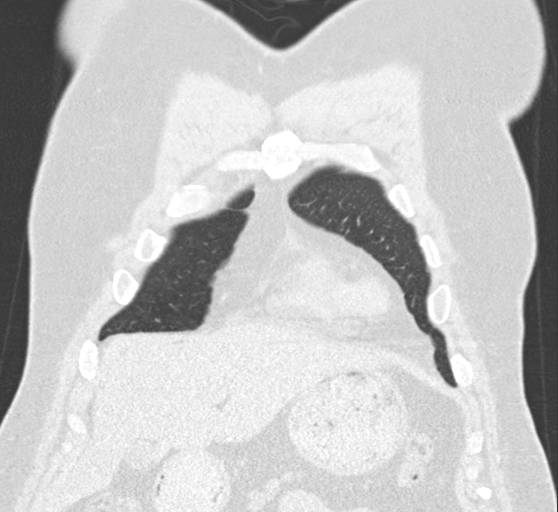
[im 58/145  lung]
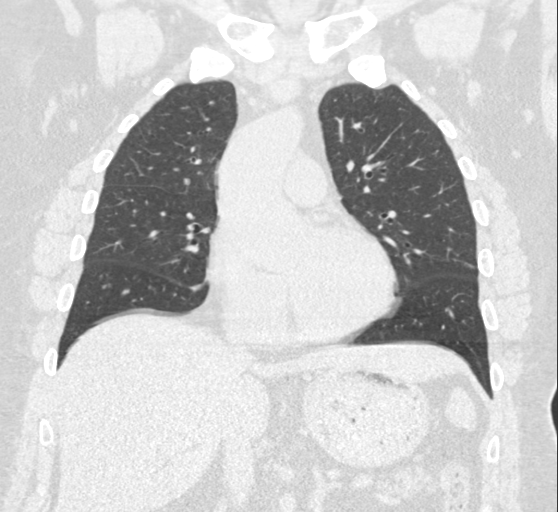
[im 87/145  lung]
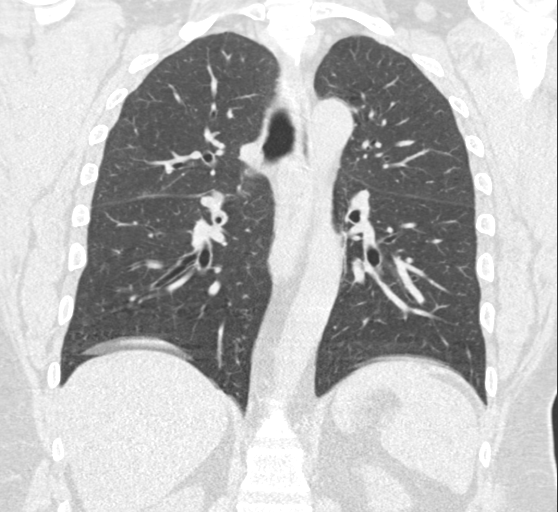

[15 of 36 positions shown; findings below may reference images not displayed]

Chest radiographs 05/17/2019,
04/27/2019 and 09/25/2018. Abdominal CTA runoff 09/20/2018.
FINDINGS: Cardiovascular: The ascending aorta is dilated to approximately
cm. The aortic arch and descending aorta are normal in caliber.
There is no significant calcification within the wall of the aorta
or surrounding soft tissue abnormality. No acute vascular findings
are seen on noncontrast imaging. The heart size is normal. There is
no pericardial effusion.

Mediastinum/Nodes: There are mildly enlarged mediastinal lymph
nodes, including a 12 mm right paratracheal node on image 47/2 and a
12 mm subcarinal node on image 74/2. No enlarged axillary or hilar
lymph nodes are identified. No evidence of mediastinal hematoma. The
thyroid gland, trachea and esophagus demonstrate no significant
findings.

Lungs/Pleura: There is no pleural effusion or pneumothorax. There is
dependent consolidation within the superior segment of the right
lower lobe, suspicious for pneumonia. The lungs are otherwise clear,
without suspicious pulmonary nodularity.

Upper abdomen: The liver demonstrates diffusely decreased density
consistent with steatosis. The visualized upper abdomen otherwise
appears unremarkable.

Musculoskeletal/Chest wall: There is no chest wall mass or
suspicious osseous finding. Mild thoracic spondylosis.
IMPRESSION: 1. Ascending aortic aneurysm measuring up to 5.2 cm in diameter. No
acute vascular findings on noncontrast imaging. No previous relevant
imaging of the thoracic aorta. Recommend semi-annual imaging
followup by CTA or MRA and referral to cardiothoracic surgery if not
already obtained. This recommendation follows 7797
ACCF/AHA/AATS/ACR/ASA/SCA/MEZA/LINDER/LORENTZO/YUGOV Guidelines for the
Diagnosis and Management of Patients With Thoracic Aortic Disease.
Circulation. 7797; 121: [id number]. Aortic aneurysm NOS ([id number])
2. Superior segment right lower lobe consolidation suspicious for
pneumonia. Correlate clinically and recommend radiographic follow-up
in 3-4 weeks to ensure resolution and exclude underlying malignancy.
3. These results will be called to the ordering clinician or
representative by the Radiologist Assistant, and communication
documented in the PACS or [REDACTED].
